# Patient Record
Sex: Female | Born: 1940 | Race: White | Hispanic: No | Smoking: Former smoker
Health system: Southern US, Community
[De-identification: ages and names within clinical notes are randomized; demographics above are authoritative.]

## PROBLEM LIST (undated history)

## (undated) DIAGNOSIS — I10 Essential (primary) hypertension: Secondary | ICD-10-CM

## (undated) DIAGNOSIS — I639 Cerebral infarction, unspecified: Secondary | ICD-10-CM

## (undated) DIAGNOSIS — R7309 Other abnormal glucose: Secondary | ICD-10-CM

## (undated) DIAGNOSIS — E785 Hyperlipidemia, unspecified: Secondary | ICD-10-CM

## (undated) DIAGNOSIS — G459 Transient cerebral ischemic attack, unspecified: Secondary | ICD-10-CM

## (undated) HISTORY — DX: Essential (primary) hypertension: I10

## (undated) HISTORY — DX: Other abnormal glucose: R73.09

## (undated) HISTORY — PX: BREAST SURGERY: SHX581

## (undated) HISTORY — DX: Transient cerebral ischemic attack, unspecified: G45.9

## (undated) HISTORY — DX: Hyperlipidemia, unspecified: E78.5

## (undated) HISTORY — DX: Cerebral infarction, unspecified: I63.9

---

## 2007-08-03 DIAGNOSIS — E7800 Pure hypercholesterolemia, unspecified: Secondary | ICD-10-CM | POA: Insufficient documentation

## 2007-08-03 DIAGNOSIS — R7309 Other abnormal glucose: Secondary | ICD-10-CM

## 2007-08-03 HISTORY — DX: Other abnormal glucose: R73.09

## 2007-08-04 DIAGNOSIS — K219 Gastro-esophageal reflux disease without esophagitis: Secondary | ICD-10-CM | POA: Insufficient documentation

## 2008-01-25 DIAGNOSIS — I10 Essential (primary) hypertension: Secondary | ICD-10-CM

## 2008-01-25 HISTORY — DX: Essential (primary) hypertension: I10

## 2008-06-13 DIAGNOSIS — J309 Allergic rhinitis, unspecified: Secondary | ICD-10-CM | POA: Insufficient documentation

## 2008-09-22 DIAGNOSIS — I493 Ventricular premature depolarization: Secondary | ICD-10-CM | POA: Insufficient documentation

## 2009-04-15 ENCOUNTER — Inpatient Hospital Stay: Payer: Self-pay | Admitting: Internal Medicine

## 2009-04-21 DIAGNOSIS — I059 Rheumatic mitral valve disease, unspecified: Secondary | ICD-10-CM | POA: Insufficient documentation

## 2009-04-21 DIAGNOSIS — I369 Nonrheumatic tricuspid valve disorder, unspecified: Secondary | ICD-10-CM | POA: Insufficient documentation

## 2009-05-04 DIAGNOSIS — I6350 Cerebral infarction due to unspecified occlusion or stenosis of unspecified cerebral artery: Secondary | ICD-10-CM | POA: Insufficient documentation

## 2010-05-18 ENCOUNTER — Ambulatory Visit: Payer: Self-pay | Admitting: Family Medicine

## 2010-06-11 ENCOUNTER — Ambulatory Visit: Payer: Self-pay | Admitting: Sports Medicine

## 2010-07-20 ENCOUNTER — Other Ambulatory Visit: Payer: Self-pay | Admitting: Sports Medicine

## 2010-07-20 DIAGNOSIS — M25552 Pain in left hip: Secondary | ICD-10-CM

## 2010-07-27 ENCOUNTER — Ambulatory Visit
Admission: RE | Admit: 2010-07-27 | Discharge: 2010-07-27 | Disposition: A | Discharge status: Home / Self Care | Payer: Medicare Other | Source: Ambulatory Visit | Attending: Sports Medicine | Admitting: Sports Medicine

## 2010-07-27 DIAGNOSIS — M25552 Pain in left hip: Secondary | ICD-10-CM

## 2010-11-23 ENCOUNTER — Ambulatory Visit: Payer: Self-pay | Admitting: Family Medicine

## 2010-12-07 ENCOUNTER — Other Ambulatory Visit: Payer: Self-pay | Admitting: Obstetrics and Gynecology

## 2010-12-07 DIAGNOSIS — N9489 Other specified conditions associated with female genital organs and menstrual cycle: Secondary | ICD-10-CM

## 2010-12-13 ENCOUNTER — Other Ambulatory Visit: Payer: Medicare Other

## 2011-01-25 HISTORY — PX: HIP SURGERY: SHX245

## 2011-01-25 HISTORY — PX: OOPHORECTOMY: SHX86

## 2011-02-02 ENCOUNTER — Ambulatory Visit: Payer: Self-pay | Admitting: Obstetrics and Gynecology

## 2011-02-02 LAB — BASIC METABOLIC PANEL
BUN: 21 mg/dL — ABNORMAL HIGH (ref 7–18)
Chloride: 104 mmol/L (ref 98–107)
Co2: 29 mmol/L (ref 21–32)
Creatinine: 0.91 mg/dL (ref 0.60–1.30)
EGFR (African American): >60
Potassium: 4.6 mmol/L (ref 3.5–5.1)

## 2011-02-02 LAB — CBC
HCT: 43 % (ref 35.0–47.0)
Platelet: 191 10*3/uL (ref 150–440)
RBC: 4.71 10*6/uL (ref 3.80–5.20)
RDW: 12.4 % (ref 11.5–14.5)
WBC: 10.5 10*3/uL (ref 3.6–11.0)

## 2011-02-07 ENCOUNTER — Ambulatory Visit: Payer: Self-pay | Admitting: Obstetrics and Gynecology

## 2011-02-10 LAB — PATHOLOGY REPORT

## 2011-06-28 ENCOUNTER — Ambulatory Visit: Payer: Self-pay | Admitting: Orthopedic Surgery

## 2011-06-28 LAB — BASIC METABOLIC PANEL
Anion Gap: 8 (ref 7–16)
Calcium, Total: 10.2 mg/dL — ABNORMAL HIGH (ref 8.5–10.1)
EGFR (African American): >60
Potassium: 4.3 mmol/L (ref 3.5–5.1)
Sodium: 137 mmol/L (ref 136–145)

## 2011-06-28 LAB — CBC
MCH: 30 pg (ref 26.0–34.0)
MCV: 91 fL (ref 80–100)
RDW: 12.8 % (ref 11.5–14.5)

## 2011-06-28 LAB — PROTIME-INR: INR: 1

## 2011-06-28 LAB — APTT: Activated PTT: 29.8 secs (ref 23.6–35.9)

## 2011-07-14 ENCOUNTER — Inpatient Hospital Stay: Payer: Self-pay | Admitting: Orthopedic Surgery

## 2011-07-15 LAB — BASIC METABOLIC PANEL
Anion Gap: 10 (ref 7–16)
BUN: 13 mg/dL (ref 7–18)
Calcium, Total: 7.9 mg/dL — ABNORMAL LOW (ref 8.5–10.1)
Chloride: 105 mmol/L (ref 98–107)
EGFR (Non-African Amer.): >60
Glucose: 105 mg/dL — ABNORMAL HIGH (ref 65–99)

## 2011-07-16 LAB — CBC WITH DIFFERENTIAL/PLATELET
Basophil #: 0 10*3/uL (ref 0.0–0.1)
Basophil %: 0.5 %
Eosinophil #: 0.2 10*3/uL (ref 0.0–0.7)
Eosinophil %: 1.6 %
HCT: 28.8 % — ABNORMAL LOW (ref 35.0–47.0)
HGB: 9.8 g/dL — ABNORMAL LOW (ref 12.0–16.0)
MCHC: 34 g/dL (ref 32.0–36.0)
Monocyte #: 1.1 x10 3/mm — ABNORMAL HIGH (ref 0.2–0.9)
Neutrophil %: 76.1 %
Platelet: 117 10*3/uL — ABNORMAL LOW (ref 150–440)
RDW: 12.5 % (ref 11.5–14.5)
WBC: 9.8 10*3/uL (ref 3.6–11.0)

## 2011-07-18 LAB — PATHOLOGY REPORT

## 2012-01-25 HISTORY — PX: HERNIA REPAIR: SHX51

## 2012-05-02 ENCOUNTER — Ambulatory Visit (INDEPENDENT_AMBULATORY_CARE_PROVIDER_SITE_OTHER): Payer: Medicare Other | Admitting: General Surgery

## 2012-05-02 ENCOUNTER — Encounter: Payer: Self-pay | Admitting: General Surgery

## 2012-05-02 VITALS — BP 118/68 | HR 80 | Resp 16 | Ht 64.5 in | Wt 134.0 lb

## 2012-05-02 DIAGNOSIS — K432 Incisional hernia without obstruction or gangrene: Secondary | ICD-10-CM

## 2012-05-02 DIAGNOSIS — Z1211 Encounter for screening for malignant neoplasm of colon: Secondary | ICD-10-CM

## 2012-05-02 MED ORDER — POLYETHYLENE GLYCOL 3350 17 GM/SCOOP PO POWD: ORAL | Status: DC

## 2012-05-02 NOTE — Patient Instructions (Addendum)
Patient advised to have hernia repaired. Patient advised to consider having a colonoscopy done. Patient advised of risks.

## 2012-05-02 NOTE — Progress Notes (Signed)
Patient ID: Gina [name], female   DOB: 04-Feb-1940, 72 y.o.   MRN: [id number]  Chief Complaint  Patient presents with  . Incisional Hernia    left    HPI Gina [name] is a 72 y.o. female who presents with a left abdominal wall hernia. She states she noticed a knot 2 months ago. Seen Dr. Elease Hashimoto who referred her here for evaluation. No complaints of pain or discomfort. She states the knot seems larger at times. Difficulty with bowel movements. No prior colonoscopies. Straining makes a left-sided abdominal wall more uncomfortable. No urinary symptoms appear HPI  Past Medical History  Diagnosis Date  . Hypertension 2010  . Mini stroke 2010, 2014    Past Surgical History  Procedure Laterality Date  . Oophorectomy  2013  . Hip surgery  2013  . Cesarean section    . Breast surgery Left 1982?    biopsy    History reviewed. No pertinent family history.  Social History History  Substance Use Topics  . Smoking status: Former Smoker -- 0.50 packs/day for 5 years  . Smokeless tobacco: Not on file  . Alcohol Use: No    Allergies  Allergen Reactions  . Penicillins Rash    Current Outpatient Prescriptions  Medication Sig Dispense Refill  . amLODipine (NORVASC) 5 MG tablet Take 5 mg by mouth daily.      Marland Kitchen aspirin 81 MG tablet Take 81 mg by mouth daily.      . metoprolol (LOPRESSOR) 50 MG tablet Take 50 mg by mouth 2 (two) times daily.      Marland Kitchen VITAMIN D, ERGOCALCIFEROL, PO Take 1,000 mcg by mouth 2 (two) times daily.      . polyethylene glycol powder (GLYCOLAX/MIRALAX) powder 255 grams one bottle for colonoscopy prep  255 g  0   No current facility-administered medications for this visit.    Review of Systems Review of Systems  Constitutional: Negative.   Respiratory: Negative.   Cardiovascular: Negative.   Gastrointestinal: Negative.     Blood pressure 118/68, pulse 80, resp. rate 16, height 5' 4.5" (1.638 m), weight 134 lb (60.782 kg).  Physical Exam Physical Exam    Constitutional: She appears well-developed and well-nourished.  Neck: Trachea normal. No mass and no thyromegaly present.  Cardiovascular: Normal rate, regular rhythm, normal heart sounds and normal pulses.   No murmur heard. Pulmonary/Chest: Effort normal and breath sounds normal.  Abdominal: Soft. Normal appearance and bowel sounds are normal. There is no hepatosplenomegaly. There is tenderness in the left lower quadrant. A hernia is present. Hernia confirmed positive in the left inguinal area.      Data Reviewed Operative report from the patient's laparoscopic left ovarian cyst excision dated 02/07/2011 reports an 11 mm XL port was utilized and left lower quadrant and the fascial defect closed with an 0 Vicryl suture.  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 A paratubal origin was thought to be the source. No evidence of malignancy.  Assessment    Change in bowel habits, likely secondary to traction on the omentum. Port site hernia in the left lower quadrant.    Plan    The patient was encouraged to have a screening colonoscopy to be sure the secondary source for her change in bowel habits is not present. The pros and cons of the procedure were reviewed. A ventral hernia repair completed laparoscopically should allow rapid return electively. The risks of the procedure as well as the role  of prosthetic mesh has been discussed.    Patient has been scheduled for a colonoscopy on 06-12-12 at [facility]. This patient has been asked to decrease current 325 mg aspirin to 81 mg aspirin starting one week prior to procedure.  Patient's hernia repair has been scheduled for 06-15-12 at [facility].     Earline Mayotte 05/03/2012, 8:22 PM

## 2012-05-03 ENCOUNTER — Encounter: Payer: Self-pay | Admitting: General Surgery

## 2012-05-03 DIAGNOSIS — K432 Incisional hernia without obstruction or gangrene: Secondary | ICD-10-CM | POA: Insufficient documentation

## 2012-05-12 ENCOUNTER — Other Ambulatory Visit: Payer: Self-pay | Admitting: General Surgery

## 2012-05-12 DIAGNOSIS — Z1211 Encounter for screening for malignant neoplasm of colon: Secondary | ICD-10-CM

## 2012-05-12 DIAGNOSIS — K439 Ventral hernia without obstruction or gangrene: Secondary | ICD-10-CM

## 2012-05-12 NOTE — Progress Notes (Signed)
PRE-OPERATIVE PATIENT ORDERS  Patient Name:  Gina [name]  Physician:  Earline Mayotte, MD  Admitting DX:  Ventral hernia   Surgery Date:  06/15/2012  Pre-admit date:  05/30/2012  Allergies:  Penicillins  Patient Status:  SDS (same day surgery)  Laboratory Studies:  None  Blood Products:  None  Radiology Studies:  None  Cardiology Studies:  None  Anesthesia:  general  Permit:  Hernia Surgery:  Repair of Incisional hernia, with or without mesh as indicated.  Prep:  None  Medications: IV:  None IV Meds Added:  Kefzol 2 grams IV on way to OR I am aware of the patient's reported penicillin allergy. Oral Meds:  None  Misc:  None  [facility] [address] [place], [place]  [address]  SIGNATURE:  Signed by    Earline Mayotte, MD Digital Signature on @SVCDATE @ at @CURTIME @ BY: Earline Mayotte, MD

## 2012-05-12 NOTE — Progress Notes (Signed)
PRE-OPERATIVE PATIENT ORDERS  Patient Name:  Gina [name]  Physician:  Earline Mayotte, MD  Admitting DX:   1. Hernia of abdominal wall   2. Encounter for screening colonoscopy     Surgery Date:  06/12/2012  Allergies:  Penicillins  Patient Status:  SDS (same day surgery)  Laboratory Studies:  None  Blood Products:  None  Radiology Studies:  None  Cardiology Studies:  None  Anesthesia:  Propofol  Permit:  Colonoscopy with biopsy or polyp removal as indicated  Prep:  No prep requested  Medications:  IV: 1/2 NS at 100 cc/hr; right side  Misc:  The history and physical will be updated the morning of the procedure.  [facility] [address] [place], [place]  [address]  SIGNATURE:  Signed by    Earline Mayotte, MD Digital Signature on @SVCDATE @ at @CURTIME @ BY: Earline Mayotte, MD

## 2012-05-30 ENCOUNTER — Ambulatory Visit: Payer: Self-pay | Admitting: General Surgery

## 2012-05-30 DIAGNOSIS — I499 Cardiac arrhythmia, unspecified: Secondary | ICD-10-CM

## 2012-05-30 LAB — CBC WITH DIFFERENTIAL/PLATELET
Basophil #: 0.1 10*3/uL (ref 0.0–0.1)
Basophil %: 0.8 %
Eosinophil #: 0.2 10*3/uL (ref 0.0–0.7)
Eosinophil %: 2.6 %
HCT: 38.6 % (ref 35.0–47.0)
Lymphocyte #: 1.7 10*3/uL (ref 1.0–3.6)
Lymphocyte %: 19.8 %
MCH: 29.1 pg (ref 26.0–34.0)
MCHC: 33.4 g/dL (ref 32.0–36.0)
Monocyte #: 0.9 x10 3/mm (ref 0.2–0.9)
Monocyte %: 11 %
Neutrophil #: 5.7 10*3/uL (ref 1.4–6.5)
Platelet: 193 10*3/uL (ref 150–440)
RBC: 4.44 10*6/uL (ref 3.80–5.20)
RDW: 13.2 % (ref 11.5–14.5)
WBC: 8.6 10*3/uL (ref 3.6–11.0)

## 2012-05-30 LAB — BASIC METABOLIC PANEL
Anion Gap: 5 — ABNORMAL LOW (ref 7–16)
Chloride: 105 mmol/L (ref 98–107)
Co2: 29 mmol/L (ref 21–32)
Creatinine: 0.92 mg/dL (ref 0.60–1.30)
EGFR (Non-African Amer.): >60
Glucose: 104 mg/dL — ABNORMAL HIGH (ref 65–99)
Sodium: 139 mmol/L (ref 136–145)

## 2012-05-31 ENCOUNTER — Telehealth: Payer: Self-pay | Admitting: *Deleted

## 2012-05-31 NOTE — Telephone Encounter (Signed)
Patient called today wanting to cancel colonoscopy that was scheduled at [facility] for 06-12-12. This patient states she will be out of town. She does not want to reschedule at this time but does want to proceed with ventral hernia repair that is scheduled for 06-15-12 at [facility].   Trish in endoscopy notified of cancellation.   Also, patient states she is concerned with decreasing 325 mg aspirin to 81 mg aspirin prior to surgery because last time she did that she had a stroke. Patient would like a call back to reassure her that this will be okay and confirm it is necessary decrease aspirin to proceed with surgery.

## 2012-06-02 ENCOUNTER — Telehealth: Payer: Self-pay | Admitting: General Surgery

## 2012-06-02 NOTE — Telephone Encounter (Signed)
Patient has declined to complete colonoscopy prior to ventral hernia repair as it will interfere with a planned trip to [place] to see her 72 year old aunt on her birthday.  She understands that other causes (besides the hernia) may be contributing to her change in bowel habits, but reports the change is modest at she does not want to postpone hernia repair at this time.  She had concerns about decreasing her ASA from 325 to 81 mg.  She had been sent home from [facility] in 2011 after her TIA on 81 mg.  (Based on review of d/c summary). She reports she has been taking 325 mg since the 2011 hospitalization.  She reports that she had TIA symptoms earlier this year that resolved spontaneously for which she did not seek medical assessment.   She decreased her ASA from 325 to 81 mg in 2013 for her hip replacement without ill effect.  At present, she was advised that she can continue on ASA 325 realizing there is a slightly increased risk of bleeding. She may elect to decrease this to 81 mg daily for one week prior to the procedure.  Will plan to proceed with repair of her suspected port site hernia in the LLQ on Jun 15, 2012 as planned.     Cc: Lorie Phenix, MD

## 2012-06-13 ENCOUNTER — Telehealth: Payer: Self-pay | Admitting: *Deleted

## 2012-06-13 NOTE — Telephone Encounter (Signed)
Patient was contacted at the request of Dr. Lemar Livings (due to injury). This patient scheduled for surgery on 06-15-12; however, Dr. Lemar Livings will not be able to complete that day. Patient was given the option to reschedule to mid June with Dr. Lemar Livings or have partner complete surgery. Patient wishes to reschedule surgery to 07-12-12.  Patient was reminded to decrease 325 mg aspirin to 81 mg aspirin one week prior to procedure. She verbalizes understanding.   Message has been left on the O.R. posting line regarding date change.

## 2012-07-02 ENCOUNTER — Telehealth: Payer: Self-pay | Admitting: *Deleted

## 2012-07-02 NOTE — Telephone Encounter (Signed)
Patient called wanting to reschedule surgery that was scheduled for 07-12-12 due to something that has come up in her schedule (it will take her about a week and a half to take care of). This has been rescheduled for 07-26-12 at [facility].  Message has been left on the O.R. Posting line regarding date change.

## 2012-07-26 ENCOUNTER — Ambulatory Visit: Payer: Self-pay | Admitting: General Surgery

## 2012-07-26 DIAGNOSIS — K439 Ventral hernia without obstruction or gangrene: Secondary | ICD-10-CM

## 2012-07-30 ENCOUNTER — Encounter: Payer: Self-pay | Admitting: General Surgery

## 2012-08-02 ENCOUNTER — Ambulatory Visit (INDEPENDENT_AMBULATORY_CARE_PROVIDER_SITE_OTHER): Payer: Medicare Other | Admitting: General Surgery

## 2012-08-02 ENCOUNTER — Encounter: Payer: Self-pay | Admitting: General Surgery

## 2012-08-02 VITALS — BP 120/80 | HR 80 | Resp 16 | Ht 64.5 in | Wt 132.0 lb

## 2012-08-02 DIAGNOSIS — K439 Ventral hernia without obstruction or gangrene: Secondary | ICD-10-CM

## 2012-08-02 NOTE — Progress Notes (Signed)
Patient ID: Gina [name], female   DOB: [birth year], 72 y.o.   MRN: [id number]  Chief Complaint  Patient presents with  . Routine Post Op    lap hernia repair    HPI Gina [name] is a 72 y.o. female who presents for a post op left lateral abdominal wall port site hernia repair. The procedure was performed on 07/26/12. The patient states she has some soreness but overall is doing well. No problems with her bowels and eating okay.    HPI  Past Medical History  Diagnosis Date  . Hypertension 2010  . Mini stroke 2010, 2014    Past Surgical History  Procedure Laterality Date  . Oophorectomy  2013  . Hip surgery  2013  . Cesarean section    . Breast surgery Left 1982?    biopsy  . Hernia repair Left 2014    Laparoscopic placement  10 x 15 cm Physiomesh at lateral abdominal wall port site hernia    History reviewed. No pertinent family history.  Social History History  Substance Use Topics  . Smoking status: Former Smoker -- 0.50 packs/day for 5 years  . Smokeless tobacco: Not on file  . Alcohol Use: No    Allergies  Allergen Reactions  . Penicillins Rash    Current Outpatient Prescriptions  Medication Sig Dispense Refill  . acetaminophen (TYLENOL) 325 MG tablet Take by mouth every 6 (six) hours as needed for pain.      Marland Kitchen amLODipine (NORVASC) 5 MG tablet Take 5 mg by mouth daily.      Marland Kitchen aspirin 81 MG tablet Take 81 mg by mouth daily.      . metoprolol (LOPRESSOR) 50 MG tablet Take 50 mg by mouth 2 (two) times daily.      Marland Kitchen VITAMIN D, ERGOCALCIFEROL, PO Take 1,000 mcg by mouth 2 (two) times daily.       No current facility-administered medications for this visit.    Review of Systems Review of Systems  Constitutional: Negative.   Respiratory: Negative.   Cardiovascular: Negative.   Gastrointestinal: Negative.     Blood pressure 120/80, pulse 80, resp. rate 16, height 5' 4.5" (1.638 m), weight 132 lb (59.875 kg).  Physical Exam Physical Exam  Constitutional: She is  oriented to person, place, and time. She appears well-developed and well-nourished.  Abdominal: Soft. Normal appearance.  All port sites healing well.   Neurological: She is alert and oriented to person, place, and time.  Skin: Skin is warm and dry.    Data Reviewed No data to review.  Assessment    Doing well status post laparoscopic repair of port site hernia the left lower abdominal wall.     Plan    The patient was encouraged to increase her activity as tolerated. Care was lifting was demonstrated.       Earline Mayotte 08/03/2012, 6:38 PM

## 2012-08-02 NOTE — Patient Instructions (Addendum)
Patient advised she can drive once she is pain free. Also instructed to continue the use of a heat pack.

## 2012-08-03 ENCOUNTER — Encounter: Payer: Self-pay | Admitting: General Surgery

## 2012-08-14 ENCOUNTER — Encounter: Payer: Self-pay | Admitting: General Surgery

## 2012-09-22 IMAGING — CR DG HIP COMPLETE 2+V*L*
1 series · 2 of 2 positions shown · non-contrast
Comparison: none

REASON FOR EXAM: pain
COMMENTS:

PROCEDURE:     KDR - KDXR HIP LEFT COMPLETE  - May 18, 2010  [DATE]
RESULT:     AP and lateral views of the left hip reveal the bones to be
mildly osteopenic. I see no acute fracture or dislocation. No lytic or
blastic lesion is identified. No significant degenerative change is seen.

[Series 1: view not recorded · 0.17mm/px · 2 of 2 slices shown]
[im 1/2]
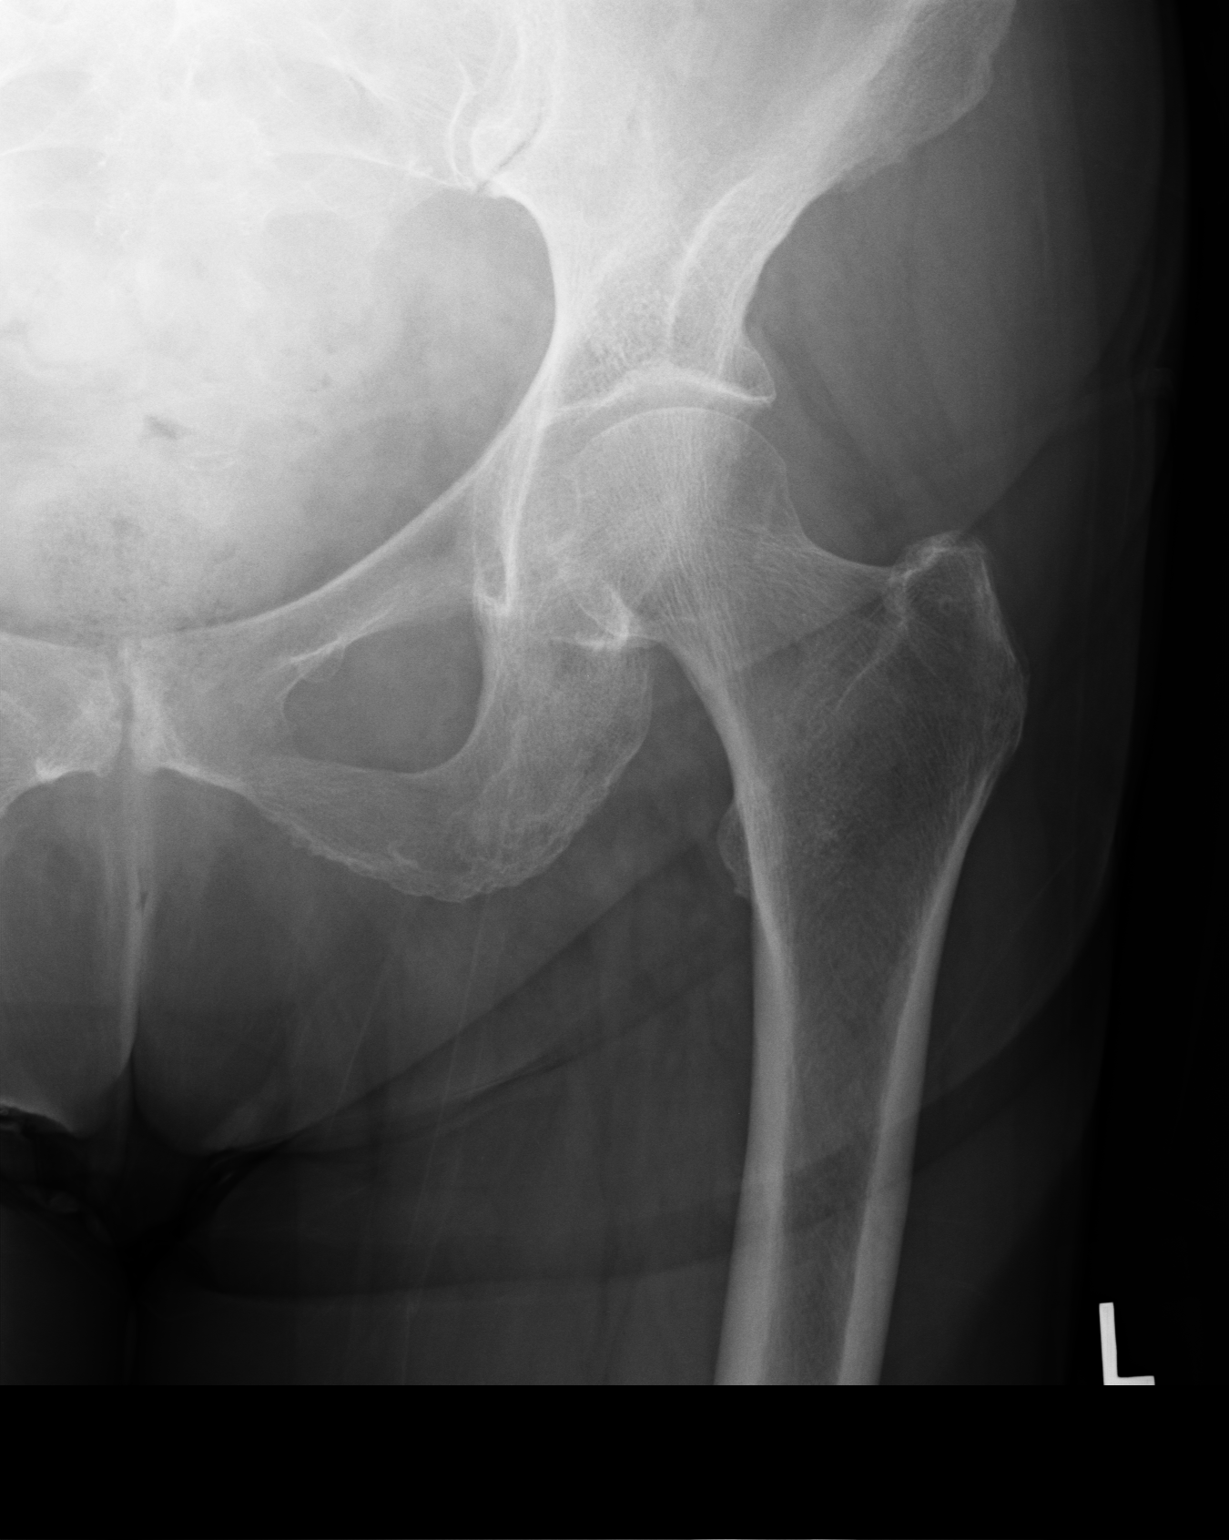
[im 2/2]
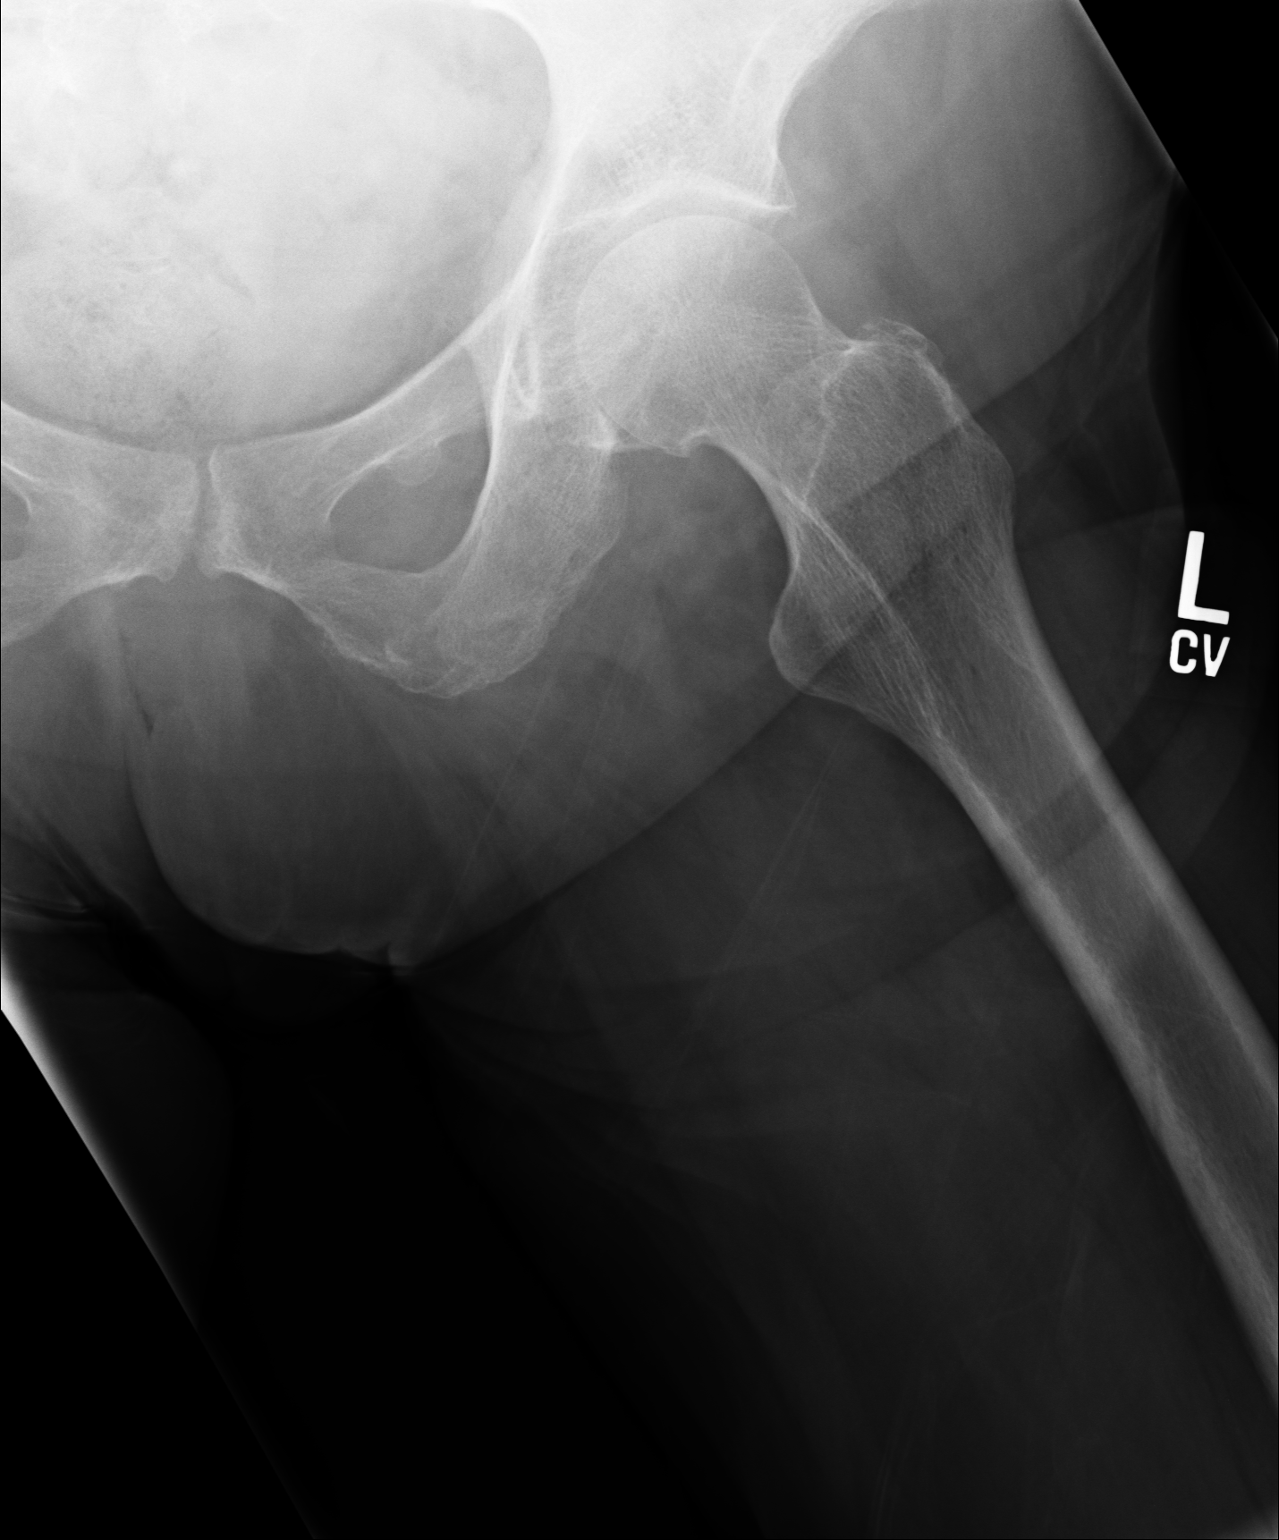

[2 of 2 positions shown; findings below may reference images not displayed]

IMPRESSION: I do not see acute bony abnormality of the left hip nor
evidence of significant degenerative change. Given the patient's chronic
symptoms, it may be useful to consider her for MRI.

## 2012-10-19 LAB — HEMOGLOBIN A1C: Hgb A1c MFr Bld: 5.9 % (ref 4.0–6.0)

## 2013-06-24 DIAGNOSIS — R002 Palpitations: Secondary | ICD-10-CM | POA: Insufficient documentation

## 2013-11-25 ENCOUNTER — Encounter: Payer: Self-pay | Admitting: General Surgery

## 2014-01-28 DIAGNOSIS — R002 Palpitations: Secondary | ICD-10-CM | POA: Diagnosis not present

## 2014-01-28 DIAGNOSIS — I10 Essential (primary) hypertension: Secondary | ICD-10-CM | POA: Diagnosis not present

## 2014-05-16 NOTE — Op Note (Signed)
PATIENT NAME:  Gina [name], Trena E MR#:  [id number] DATE OF BIRTH:  August 31, 1940  DATE OF PROCEDURE:  07/26/2012  PREOPERATIVE DIAGNOSIS: Left lateral abdominal wall port site hernia.   POSTOPERATIVE DIAGNOSIS: Left lateral abdominal wall port site hernia.  OPERATIVE PROCEDURE: Repair of lateral abdominal wall hernia with a 10 x 15 Physiomesh placed laparoscopically.   SURGEON: Donnalee CurryJeffrey Sybrina Laning, MD   ANESTHESIA: General endotracheal, under Dr. Maisie Fushomas.   ESTIMATED BLOOD LOSS: Less than 5 mL.   CLINICAL NOTE: This 74 year old woman had undergone an ovarian cystectomy with an 11 mm port placed in the left lower quadrant. This had been closed with a Vicryl suture. The patient has developed an enlarging hernia in this area and was felt to be a candidate for elective repair. She received Kefzol prior to the procedure.   OPERATIVE NOTE: With the patient under adequate general endotracheal anesthesia, the abdomen was prepped with ChloraPrep and draped. A 5 mm step port was placed through a transumbilical incision. After an adequate hanging drop test, the abdomen was insufflated 10 mmHg pressure. Inspection showed a significant volume of omentum herniated through the old port site in the left lower quadrant. A 5 and 11 mm port were then placed on the right side of the abdomen under direct vision. The omental tissue was retracted and hemostasis achieved by electrocautery. After exposing the defect, it was elected to place a 10 x 15 cm Physiomesh. This is brought into the field, orientated with the cross centered over the defect and then approximately 30 secure strap tacks used to anchor it in place in multiple concentric rings. Bleeding from likely the inferior epigastric vessel within the rectus sheath was encountered, and this was controlled with a 0 Vicryl figure-of-eight suture placed transvaginally. With good hemostasis achieved, the abdomen was then desufflated and the 11 mm port site in the right lower quadrant  was closed with a transvaginal 0 Maxon suture. Skin incisions were closed with 4-0 Vicryl subcuticular suture. Benzoin, Steri-Strips, Telfa, and Tegaderm dressings were applied.   The patient tolerated the procedure well and was taken to the recovery room in stable condition.   ____________________________ Earline MayotteJeffrey W. Shasta Chinn, MD jwb:cb D: 07/26/2012 17:27:57 ET T: 07/26/2012 23:49:11 ET JOB#: [id number]  cc: Earline MayotteJeffrey W. Justyn Boyson, MD, <Dictator> Myrle ShengKristi M. Katrinka BlazingSmith, MD Casimira Sutphin Brion AlimentW Yehonatan Grandison MD ELECTRONICALLY SIGNED 07/31/2012 21:03

## 2014-05-18 NOTE — Op Note (Signed)
PATIENT NAME:  Gina [name], Gina [name] MR#:  [id number] DATE OF BIRTH:  [birth year]  DATE OF PROCEDURE:  07/14/2011  PREOPERATIVE DIAGNOSIS: Left hip avascular necrosis.   POSTOPERATIVE DIAGNOSIS: Left hip avascular necrosis.   PROCEDURE: Left hip total hip replacement.   ANESTHESIA: Spinal.   SURGEON: Leitha SchullerMichael J. Verne Cove,  MD   ASSISTANT: Thompson GrayerJonathan Prentice, PA-C   DESCRIPTION OF PROCEDURE: The patient was brought to the Operating Room, and after adequate anesthesia was obtained the patient was placed on the operative table with the Medacta attachment for anterior hip replacement. After prepping and draping in the usual sterile fashion and obtaining preoperative x-rays for intraoperative measurements, appropriate patient identification and timeout procedures were carried out. An incision was made just lateral to the medial border of the tensor fascia lata muscle centered over the greater trochanter. Next, the tensor fascia was incised and the muscle retracted laterally. The deep fascia was then incised, and the fascia over the rectus was identified and incised with the rectus being retracted medially. At this point, the anterior capsule and the anterior fat pad were exposed. The fat pad over the anterior capsule was ablated with the use of argon beam cautery. Next, the C-arm was brought in to check localization and an anterior arthrotomy carried out, developing flaps for subsequent placement of a modified Charnley retractor. A femoral neck cut was carried out at this point, again checking position with the C-arm and the head removed. There was an approximately 3.5 cm diameter area of exposed bone on the femoral head. A portion of the labrum was excised to get adequate visualization of the cup along with excision of the ligamentum teres. Sequential reaming was carried out with 48 mm giving good bleeding bone. Next, the 48 mm trial was placed, and with pullout test it was very secure. The final component was then  impacted into position and deemed to be appropriate position. Next, going to the femur, the posterior capsule was partially incised to release this and allow for lateral mobilizing of the proximal femur to get adequate exposure. A box osteotome was used proximally, followed by sequential broaching with a #3 broach in place. Trials were made and appropriate length chosen with equal leg lengths and good fill of the proximal femur. At this point, the final stem was inserted and final head and neck components assembled and placed. The hip was then reduced and was stable through a range of motion. The wound was thoroughly irrigated. The arthrotomy was repaired using #1 Vicryl, a running quill suture for the tensor fascia, a subcutaneous drain placed, followed by a subcutaneous 2-0 quill suture, followed by skin staples, Xeroform, 4 x 4's, ABD, and tape.   ESTIMATED BLOOD LOSS: 550.   COMPLICATIONS: None.   SPECIMENS: Femoral head.   IMPLANTS: Medacta Versafit cup, DM size 48 acetabular liner with a #3 standard AMIS stem, a size S 28 mm femoral head with a dual mobility liner for the 48 mm cup.   COMPLICATIONS:  None.   CONDITION TO THE RECOVERY ROOM: Stable.   ____________________________ Leitha SchullerMichael J. Sheyann Sulton, MD mjm:cbb D: 07/14/2011 21:28:08 ET T: 07/15/2011 10:44:26 ET JOB#: [id number]  cc: Leitha SchullerMichael J. Zahra Peffley, MD, <Dictator> Leitha SchullerMICHAEL J Zekiah Coen MD ELECTRONICALLY SIGNED 07/15/2011 11:55

## 2014-05-18 NOTE — Discharge Summary (Signed)
PATIENT NAME:  Gina [name], Gina [name] MR#:  [id number] DATE OF BIRTH:  1940/02/21  DATE OF ADMISSION:  07/14/2011 DATE OF DISCHARGE:  07/17/2011  ADMITTING DIAGNOSIS: Status post left total hip replacement for avascular necrosis.   DISCHARGE DIAGNOSIS: Status post left total hip replacement for avascular necrosis.   ATTENDING: Kennedy Bucker, MD [facility])  PROCEDURES: On 07/14/2011 the patient underwent left total hip arthroplasty by Dr. Rosita Kea with assistant Thompson Grayer, PA-C.   ANESTHESIA: Spinal.   ESTIMATED BLOOD LOSS: 550 mL.  SPECIMENS SENT: Femoral head.   OPERATIVE FINDINGS: Severe degenerative change of the femoral head. Hemovac drain was placed.   IMPLANTS: Medacta, Amis system.   COMPLICATIONS: No complications occurred.  PATHOLOGY: The pathology report was consistent with avascular necrosis as well as moderate osteoarthrosis.  PATIENT HISTORY: The patient is 74 year old who has previously been seen by several providers in our office. She has had a several year history of left hip, both lateral and in the groin, pain. She is using a cane for ambulation. Her pain has increased. She has had nonoperative treatment with NSAIDs and narcotics without relief. MRI of her left hip from 2012 from [facility] had revealed diffuse bone marrow edema in her femoral head consistent with avascular necrosis. She had also had left hip effusion.   ALLERGIES AND ADVERSE REACTIONS: Penicillin and HCTZ.   PAST MEDICAL HISTORY:  1. Transient ischemic attack in 2011.  2. Hypertension. 3. Shingles.  4. Chickenpox.   PHYSICAL EXAMINATION: HEART: Largely irregular rate and rhythm, although she has an occasional skipped or altered beat and this is normal for her she indicates. LUNGS: Clear to auscultation. LEFT HIP: Previous examination has revealed 5 degrees of internal rotation and 20 degrees of external rotation and pain with rotational motion. No flexion contracture. Distally  she has a palpable distal pulse in her leg and is able to actively plantar flex and dorsiflex at the ankle well. Her calf is not significantly tender.   HOSPITAL COURSE: The patient underwent the aforementioned procedure by Dr. Rosita Kea without complication on 07/14/2011 and was transferred to the postanesthesia care unit and then the orthopedic floor in stable condition. First day postoperative she was already being treated with deep vein thrombosis prophylaxis with aspirin, TED hose, AV-I boots, and Xarelto. She was tolerating her diet. Her dressing was clean and intact. She was having some moderate pain, unfortunately. On the second day postoperative, she was tolerating physical therapy but her progression had been slow. Her Hemovac was removed and her dressing was changed. There was no sign of infection about her surgical site. She was afebrile with vital signs that were stable. She would improve during her stay here. She would ambulate with therapy up to 170 feet and work on therapeutic exercises. She would continue to have a good deal of pain, unfortunately. Of note her hemoglobin did drop to 9.8, on 08/15/2011. However, no transfusion had been deemed necessary while she was here. The patient did have her Foley catheter removed in good time. She past stool on multiple occasions while here.   CONDITION AT DISCHARGE: Stable.   DISPOSITION: Home with home health physical therapy.   DISCHARGE MEDICATIONS:  1. Oxycodone 5 to 10 mg every four hours as needed for pain.  2. She may also take her tramadol as well as some Tylenol as needed for pain. 3. Flexeril 5 mg as needed for muscle spasm and cramping.  4. Xarelto 10 mg a day. She will not resume aspirin  until finishing this.   DISCHARGE INSTRUCTIONS AND FOLLOWUP:  1. She is weight-bearing as tolerated on the affected leg. She will wear knee-high TED hose during the day.  2. Regular diet.  3. She can resume her home medicines with multivitamin.  4. She  may ice her hip, but she will leave her dressing on, keep it clean and dry.  5. She is to call our office for any disturbing symptoms. She will also call our office to confirm a two week postoperative follow-up appointment. ____________________________ Letta MoynahanJonathan R. Ayansh Feutz, [place]PA jrp:slb D: 08/01/2011 16:58:48 ET T: 08/01/2011 17:23:46 ET JOB#: [id number]  cc: Letta MoynahanJonathan R. Clyde CanterburyPrentice, [place]PA, <Dictator> Letta MoynahanJONATHAN R Ausar Georgiou PA ELECTRONICALLY SIGNED 08/04/2011 8:09

## 2014-05-18 NOTE — Op Note (Signed)
PATIENT NAME:  Gina [name], BIER MR#:  [id number] DATE OF BIRTH:  1940-03-20  DATE OF PROCEDURE:  02/07/2011  PREOPERATIVE DIAGNOSES:  1. Left adnexal mass.  2. Abnormal endometrial fluid collection on ultrasound.   POSTOPERATIVE DIAGNOSES:  1. 6 cm left ovarian cyst.  2. Abnormal endometrial fluid collection.  3. Dense pelvic adhesive disease.   OPERATIVE PROCEDURES:  1. Dilation and curettage of endometrium.  2. Operative laparoscopy with adhesiolysis and left salpingo-oophorectomy.   SURGEON: Prentice Docker. Travas Schexnayder, MD   FIRST ASSISTANT: None.   ANESTHESIA: General endotracheal.   INDICATION: Gina [name] is a 74 year old white female who presents for surgical management of significant symptomatic left adnexal cystic mass. During work-up for abdominal pain, this mass was noted; preoperative CA-125 was normal. The patient also had findings of an abnormal endometrial fluid collection on ultrasound and dilation and curettage was recommended.   FINDINGS AT SURGERY: Dense pelvic adhesive disease with bowel/uterine/adnexal adhesions bilaterally. There was a 6 cm smooth cystic mass within the cul-de-sac that was mobilized. The cyst fluid was noted to be clear on aspiration. The right ovary was grossly normal once it was identified following adhesiolysis. There were dense thick adhesions between the bowel and right uterine cornual area which were left intact because of the degree of fibrosis noted. The left ureter was noted to have normal peristalsis following the LSO.   DESCRIPTION OF PROCEDURE: The patient was brought to the operating room where she was placed in the supine position and general endotracheal anesthesia was induced without difficulty. She was placed in the low lithotomy position and a ChloraPrep and Betadine abdominal, perineal, intravaginal prep and drape was performed in a standard fashion. The vaginal introitus was stenotic and, therefore, a pediatric speculum had to be used to access  the cervix. Cervix was grasped with single-tooth tenaculum and dilated using Hanks dilators up to a #18 caliber. Because of the limited access to the vagina, decision was to forgo hysteroscopy and proceed with curettage. Uterus did sound to 10 cm and scant tissue was obtained on curettage with a serrated curette. Following completion of this part of the procedure, a Hulka tenaculum was placed into the cervix. All instrumentation was removed from the vagina. The abdominal laparoscopy was then performed in the following manner.   Subumbilical incision 1 cm in length was infiltrated using Sensorcaine with 1:200,000 epinephrine. Scalpel incision 5 mm in length was made and this was followed by direct entry into the abdominal pelvic cavity using the Optiview laparoscopic 5 mm trocar system. No bowel or vascular injury was encountered. A 5 mm port was then placed in the right lower quadrant and an 11 mm port was placed in the left lower quadrant under direct visualization. Anterior abdominal wall bowel adhesions were then lysed using the Ace Harmonic scalpel. These were taken down and the extensive bowel adnexal adhesions were encountered. Both sharp and blunt dissection was used to mobilize these adhesions. The Ace Harmonic scalpel was the primary instrument in the left adnexal region. Once adequate takedown of adhesions was accomplished following 20 minutes of dissection, the left ovary with the cystic mass component was mobilized out of the cul-de-sac. The Ace Harmonic scalpel was used to clamp, desiccate, and cut the infundibulopelvic ligament as well as the uteroovarian ligament. Once mobilized, it was placed in an EndoCatch bag and was brought to the surface of the 11 mm port site. Here the cystic fluid was aspirated using a needle aspirator and the fluid was noted to  be clear. This decompressed the cystic mass and it was removed from the left 11 mm port site. The trocar was placed back into the abdominal cavity.  The right tube and ovary were then dissected free of adhesions although very thick bowel right uterine cornu adhesions were still persisting and decision was made to stop at this point being that the right ovary appeared grossly normal although it was densely stuck to the right pelvic sidewall. Because of the close proximity of the ureter, decision was made to let the ovary remain in-situ as it had normal appearance on laparoscopy. Pelvis was copiously irrigated. The irrigant fluid was aspirated. The procedure was then terminated with all instruments being removed from the abdomen. The 11 mm port site was closed with 0 Vicryl on the fascia and 4-0 plain on the skin. Dermabond was placed over the three incisions. The patient was then awakened, extubated, and taken to the recovery room in satisfactory condition.   Estimated blood loss was minimal. Complications were none. All instruments, needle, and sponge counts were verified as correct.    The patient was given 600 mg of Cleocin antibiotic intraoperatively because of the extensive nature of the adhesions and dissection.   ____________________________ Prentice DockerMartin A. Dreyton Roessner, MD mad:drc D: 02/07/2011 16:15:09 ET T: 02/08/2011 10:19:37 ET JOB#: [id number]  cc: Daphine DeutscherMartin A. Hamed Debella, MD, <Dictator> Prentice DockerMARTIN A Alajia Schmelzer MD ELECTRONICALLY SIGNED 02/21/2011 12:43

## 2014-05-18 NOTE — H&P (Signed)
PATIENT NAME:  Gina [name], Gina [name] MR#:  [id number] DATE OF BIRTH:  [birth year]  DATE OF ADMISSION:  02/07/2011  PREOPERATIVE DIAGNOSES:  1. Left adnexal mass.  2. Abnormal endometrial fluid collection.  HISTORY OF PRESENT ILLNESS: Gina [name] is a 74 year old widowed white female, para 1-0-1-1, menopausal, on no hormone replacement therapy, who presents for surgical management of left adnexal cyst and an atypical endometrial fluid collection that was found on ultrasound and MRI. The patient had been undergoing work-up of left hip pain when MRI on 08/06/2010 revealed an incidental septated cystic lesion in the left posterior pelvis measuring 6.3 x 4.4 x 4.7 cm. Subsequent ultrasound did reveal an atypical endometrial fluid collection within the uterus measuring 1.95 x 0.72 cm. Outpatient endometrial biopsy could not be accomplished. The left ovary did demonstrate a 6.23 cm adnexal mass that appeared to be adjacent to or arising from the left ovary. It was anechoic. There was no vascular flow noted on Doppler. In light of these findings, the patient was counseled to undergo hysteroscopy with dilation and curettage as well as laparoscopic bilateral salpingo-oophorectomy. The patient denies history of vaginal bleeding. She denies pelvic pain. Bowel function is reportedly normal. Bladder function is reportedly normal. There has been no fever, chills, sweats, nausea, vomiting, change in weight, or change in clothing size.   PAST MEDICAL HISTORY:  1. Chronic hypertension.  2. 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  3. Gastroesophageal reflux disease.  4. Seasonal allergies. 5. History of stroke in 2010. 6. Hyperlipidemia.  7. Herpes zoster.  8. Multiple urinary tract infections.  PAST SURGICAL HISTORY:  1. Cesarean section x1.  2. Dilation and curettage.  3. Breast biopsy.   PAST OB HISTORY: Para 1-0-1-1. Cesarean section for failure to progress with infant being 8 pounds, 8 ounces.  SAB x1 that  did require dilation and curettage.   PAST GYN HISTORY: Menarche age 7. Menopause age 23. The patient has no history of being on hormone replacement therapy. Has no history of abnormal Pap smears.   FAMILY HISTORY: Negative for cancer of the colon, ovary, or breast.   SOCIAL HISTORY: The patient does not smoke, does not drink, and does not use drugs. The patient is a retired Film/video editor. She is widowed x12 years.   REVIEW OF SYSTEMS: The patient denies recent illness. She denies history of reactive airway disease. She denies history of coagulopathy.   PHYSICAL EXAMINATION:   VITAL SIGNS: Height 5 feet 4 inches, weight 135, blood pressure 156/72, heart rate 71, and BMI 23.  GENERAL:  A pleasant white female who is alert and oriented. Affect is appropriate.   HEENT: Oropharynx is clear.   NECK: Supple. There is no thyromegaly or adenopathy.   LUNGS: Clear.   HEART: Regular rate and rhythm without murmur, S3 or S4.   ABDOMEN: Soft, slightly protuberant. No hernias. Midline scar in the abdomen is well healed and without evidence of hernia. There is no hepatosplenomegaly. There is mild tenderness in the left lower quadrant, , without peritoneal signs.   PELVIC: External genitalia is notable for atrophy. BUS is normal. Vagina has stenosis of  the introitus. There is markedly decreased estrogen affect. The cervix is poorly visualized with pediatric and Peterson speculums. Endometrial biopsy could not be attempted because of atrophy and will be deferred to the OR. Rectovaginal examination reveals a midplane uterus that is mobile, nontender, and of normal size and shape. There was a palpable left adnexal mass that is mobile and  approximately 5 cm in diameter. It is 2 out of 4 tender. The right adnexa is nonpalpable and nontender.   EXTREMITIES: No clubbing, cyanosis, or edema.   SKIN: No rash.   IMPRESSION:  1. 6 cm left adnexal mass, minimally symptomatic, found on bimanual examination.  MRI is suggestive of a simple or septated cyst.  2. Atypical fluid collection within the endometrial cavity of uncertain significance without history of postmenopausal bleeding or discharge.   PLAN:  1. Hysteroscopy with dilation and curettage.  2. Laparoscopy with probable bilateral salpingo-oophorectomy. Date of surgery is 02/07/2011.   CONSENT NOTE: Gina [name] is to undergo laparoscopy with bilateral salpingo-oophorectomy and hysteroscopy with dilation and curettage on 02/07/2011. She is understanding of the planned procedure and is aware of and accepting of all surgical risks which include but are not limited to bleeding, infection, pelvic organ injury with need for repair, blood clot disorders, anesthesia risks, and death. All questions are answered and informed consent is given. The patient is ready and willing to proceed with surgery as scheduled. ____________________________ Prentice DockerMartin A. Ladasia Sircy, MD mad:slb D: 02/02/2011 17:07:21 ET T: 02/02/2011 17:27:48 ET JOB#: [id number]  cc: Daphine DeutscherMartin A. Thaddius Manes, MD, <Dictator> Prentice DockerMARTIN A Danniela Mcbrearty MD ELECTRONICALLY SIGNED 02/21/2011 12:42

## 2014-07-18 ENCOUNTER — Ambulatory Visit (INDEPENDENT_AMBULATORY_CARE_PROVIDER_SITE_OTHER): Payer: Medicare Other | Admitting: Family Medicine

## 2014-07-18 ENCOUNTER — Encounter: Payer: Self-pay | Admitting: Family Medicine

## 2014-07-18 VITALS — BP 162/90 | HR 80 | Temp 98.1°F | Resp 16 | Ht 64.0 in | Wt 140.0 lb

## 2014-07-18 DIAGNOSIS — G5790 Unspecified mononeuropathy of unspecified lower limb: Secondary | ICD-10-CM | POA: Insufficient documentation

## 2014-07-18 DIAGNOSIS — H6121 Impacted cerumen, right ear: Secondary | ICD-10-CM | POA: Insufficient documentation

## 2014-07-18 DIAGNOSIS — R270 Ataxia, unspecified: Secondary | ICD-10-CM | POA: Insufficient documentation

## 2014-07-18 DIAGNOSIS — N9489 Other specified conditions associated with female genital organs and menstrual cycle: Secondary | ICD-10-CM | POA: Insufficient documentation

## 2014-07-18 DIAGNOSIS — E559 Vitamin D deficiency, unspecified: Secondary | ICD-10-CM | POA: Insufficient documentation

## 2014-07-18 DIAGNOSIS — R748 Abnormal levels of other serum enzymes: Secondary | ICD-10-CM | POA: Insufficient documentation

## 2014-07-18 DIAGNOSIS — Z8673 Personal history of transient ischemic attack (TIA), and cerebral infarction without residual deficits: Secondary | ICD-10-CM | POA: Insufficient documentation

## 2014-07-18 NOTE — Progress Notes (Signed)
Subjective:     Patient ID: Gina [name], female   DOB: 1940/11/22, 74 y.o.   MRN: [id number]  HPI  Chief Complaint  Patient presents with  . Ear Fullness    right more so than left  States it feels  like prior wax impactions. Reports allergies are under control.   Review of Systems  Cardiovascular:       States she has an element of white coat hypertension       Objective:   Physical Exam  Constitutional: She appears well-developed and well-nourished. No distress.  HENT:  Left Ear: Tympanic membrane normal.  Right ear with cerumen impaction. After irrigation per Irving Burton canal is patent, TM is intact without inflammation and patient reports improvement in her sx.       Assessment:    1. Impacted cerumen of right ear - Ear Lavage    Plan:    Minimize use of Q-tips.

## 2014-07-18 NOTE — Patient Instructions (Signed)
Avoid Qtips.

## 2014-09-01 ENCOUNTER — Ambulatory Visit (INDEPENDENT_AMBULATORY_CARE_PROVIDER_SITE_OTHER): Payer: Medicare Other | Admitting: Family Medicine

## 2014-09-01 ENCOUNTER — Encounter: Payer: Self-pay | Admitting: Family Medicine

## 2014-09-01 VITALS — BP 146/72 | HR 64 | Temp 97.8°F | Resp 16 | Wt 140.0 lb

## 2014-09-01 DIAGNOSIS — I6350 Cerebral infarction due to unspecified occlusion or stenosis of unspecified cerebral artery: Secondary | ICD-10-CM

## 2014-09-01 DIAGNOSIS — I10 Essential (primary) hypertension: Secondary | ICD-10-CM | POA: Diagnosis not present

## 2014-09-01 DIAGNOSIS — I639 Cerebral infarction, unspecified: Secondary | ICD-10-CM | POA: Diagnosis not present

## 2014-09-01 DIAGNOSIS — M1990 Unspecified osteoarthritis, unspecified site: Secondary | ICD-10-CM | POA: Diagnosis not present

## 2014-09-01 DIAGNOSIS — M25552 Pain in left hip: Secondary | ICD-10-CM | POA: Diagnosis not present

## 2014-09-01 MED ORDER — PREDNISONE 10 MG PO TABS: ORAL_TABLET | ORAL | Status: DC

## 2014-09-01 NOTE — Progress Notes (Signed)
Patient ID: Gina [name], female   DOB: 05-14-1940, 74 y.o.   MRN: [id number]         Patient: Gina [name] Female    DOB: 06-11-1940   74 y.o.   MRN: [id number] Visit Date: 09/01/2014  Today's Provider: Margarita Rana, MD   Chief Complaint  Patient presents with  . Hypertension  . Hip Pain   Subjective:    Hypertension This is a chronic problem. The problem is unchanged. The problem is controlled (At home her blood pressure runs around 140's she does not remember the lower number. ). Associated symptoms include malaise/fatigue. Pertinent negatives include no blurred vision, chest pain, headaches, neck pain, palpitations, peripheral edema, PND or shortness of breath. Compliance problems include medication side effects (Pt is wondering if the increased Metoprolol is causing her fatigue).   Hip Pain  There was no injury mechanism. The pain is present in the left hip. The quality of the pain is described as aching. The pain is at a severity of 6/10. The pain has been worsening (Especially when the weather changes.) since onset. Pertinent negatives include no numbness. She reports no foreign bodies present. The symptoms are aggravated by weight bearing. Treatments tried: Had surgery about 3-4 years ago.     Did see her orthopedic doctor and he said her hip is ok, but thinks it is her back. Gave her some back exercises.  Did not help.   Is taking Metoprolol twice a day.      Allergies  Allergen Reactions  . Chlorthalidone Nausea Only  . Hydrochlorothiazide   . Lisinopril Cough  . Penicillins Rash   Previous Medications   ACETAMINOPHEN (TYLENOL) 325 MG TABLET    Take by mouth every 6 (six) hours as needed for pain.   AMLODIPINE (NORVASC) 5 MG TABLET    Take 5 mg by mouth daily.   ASPIRIN 325 MG TABLET    Take 325 mg by mouth daily.   CETIRIZINE (ZYRTEC) 10 MG TABLET    Take 10 mg by mouth daily.   METOPROLOL (LOPRESSOR) 100 MG TABLET    Take 100 mg by mouth 2 (two) times daily.   RANITIDINE  (ZANTAC) 150 MG TABLET    Take 150 mg by mouth daily.   VITAMIN D, ERGOCALCIFEROL, PO    Take 1,000 mcg by mouth 2 (two) times daily.    Review of Systems  Constitutional: Positive for malaise/fatigue. Negative for fever, chills, diaphoresis, activity change, appetite change, fatigue and unexpected weight change.  Eyes: Negative for blurred vision.  Respiratory: Negative for apnea, cough, choking, chest tightness, shortness of breath, wheezing and stridor.   Cardiovascular: Positive for leg swelling (Left ankle swells.). Negative for chest pain, palpitations and PND.  Gastrointestinal: Negative for nausea, vomiting, abdominal pain, diarrhea, constipation, blood in stool, abdominal distention, anal bleeding and rectal pain.  Musculoskeletal: Positive for myalgias, back pain, arthralgias and gait problem. Negative for joint swelling, neck pain and neck stiffness.  Neurological: Negative for dizziness, tremors, seizures, syncope, speech difficulty, weakness, light-headedness, numbness and headaches.    History  Substance Use Topics  . Smoking status: Former Smoker -- 0.50 packs/day for 5 years    Quit date: 01/23/1970  . Smokeless tobacco: Never Used  . Alcohol Use: No   Objective:   BP 146/72 mmHg  Pulse 64  Temp(Src) 97.8 F (36.6 C) (Oral)  Resp 16  Wt 140 lb (63.504 kg)  Physical Exam  Constitutional: She is oriented to person, place, and  time. She appears well-developed and well-nourished.  Cardiovascular: Normal rate, regular rhythm and intact distal pulses.   Pulmonary/Chest: Effort normal and breath sounds normal.  Musculoskeletal: She exhibits no edema. Tenderness: Mid back and down left leg. Leg is very tense with standing.   Neurological: She is alert and oriented to person, place, and time.  Psychiatric: She has a normal mood and affect. Her behavior is normal. Judgment and thought content normal.      Assessment & Plan:     1. Benign hypertension Elevated slightly  today. Monitor. Due for labs, will check. Follow up for Wellness.  - CBC with Differential/Platelet - Lipid panel - Comprehensive metabolic panel - TSH  2. Left hip pain Worsening. Check labs. Will consider referral to another orthopedic group.   3. Arthritis Worsening. Suspect OA, but will treat and check labs for inflammatory arthritis.  - Sed Rate (ESR) - Cyclic citrul peptide antibody, IgG - Rheumatoid Factor - predniSONE (DELTASONE) 10 MG tablet; 6 po for 2 days and then 5 po for 2 days and then 4 po for 2 days and 3 po for 2 days and then 2 po for 2 days and then 1 po for 2 days.  Dispense: 42 tablet; Refill: 0  4. Cerebral artery occlusion with cerebral infarction Check labs. Stable.  - Lipid panel       Margarita Rana, MD  [facility]

## 2014-09-05 ENCOUNTER — Telehealth: Payer: Self-pay

## 2014-09-05 LAB — SEDIMENTATION RATE: Sed Rate: 11 mm/hr (ref 0–40)

## 2014-09-05 LAB — COMPREHENSIVE METABOLIC PANEL
ALBUMIN: 4.1 g/dL (ref 3.5–4.8)
ALT: 18 IU/L (ref 0–32)
AST: 13 IU/L (ref 0–40)
Albumin/Globulin Ratio: 1.3 (ref 1.1–2.5)
Alkaline Phosphatase: 77 IU/L (ref 39–117)
BUN / CREAT RATIO: 29 — AB (ref 11–26)
BUN: 33 mg/dL — AB (ref 8–27)
Bilirubin Total: 0.3 mg/dL (ref 0.0–1.2)
CALCIUM: 10.3 mg/dL (ref 8.7–10.3)
CO2: 23 mmol/L (ref 18–29)
CREATININE: 1.12 mg/dL — AB (ref 0.57–1.00)
Chloride: 102 mmol/L (ref 97–108)
GFR calc Af Amer: 56 mL/min/{1.73_m2} — ABNORMAL LOW (ref >59)
GFR calc non Af Amer: 49 mL/min/{1.73_m2} — ABNORMAL LOW (ref >59)
GLOBULIN, TOTAL: 3.2 g/dL (ref 1.5–4.5)
Glucose: 108 mg/dL — ABNORMAL HIGH (ref 65–99)
Potassium: 5 mmol/L (ref 3.5–5.2)
Sodium: 143 mmol/L (ref 134–144)
Total Protein: 7.3 g/dL (ref 6.0–8.5)

## 2014-09-05 LAB — LIPID PANEL
CHOL/HDL RATIO: 3.5 ratio (ref 0.0–4.4)
CHOLESTEROL TOTAL: 188 mg/dL (ref 100–199)
HDL: 54 mg/dL (ref >39)
LDL Calculated: 109 mg/dL — ABNORMAL HIGH (ref 0–99)
Triglycerides: 125 mg/dL (ref 0–149)
VLDL CHOLESTEROL CAL: 25 mg/dL (ref 5–40)

## 2014-09-05 LAB — CBC WITH DIFFERENTIAL/PLATELET
Basophils Absolute: 0 10*3/uL (ref 0.0–0.2)
Basos: 0 %
EOS (ABSOLUTE): 0 10*3/uL (ref 0.0–0.4)
EOS: 0 %
Hematocrit: 41.3 % (ref 34.0–46.6)
Hemoglobin: 13.9 g/dL (ref 11.1–15.9)
IMMATURE GRANULOCYTES: 0 %
Immature Grans (Abs): 0 10*3/uL (ref 0.0–0.1)
Lymphocytes Absolute: 1.4 10*3/uL (ref 0.7–3.1)
Lymphs: 13 %
MCH: 29.8 pg (ref 26.6–33.0)
MCHC: 33.7 g/dL (ref 31.5–35.7)
MCV: 89 fL (ref 79–97)
MONOCYTES: 6 %
Monocytes Absolute: 0.6 10*3/uL (ref 0.1–0.9)
NEUTROS ABS: 8.2 10*3/uL — AB (ref 1.4–7.0)
NEUTROS PCT: 81 %
PLATELETS: 209 10*3/uL (ref 150–379)
RBC: 4.66 x10E6/uL (ref 3.77–5.28)
RDW: 14 % (ref 12.3–15.4)
WBC: 10.3 10*3/uL (ref 3.4–10.8)

## 2014-09-05 LAB — TSH: TSH: 0.654 u[IU]/mL (ref 0.450–4.500)

## 2014-09-05 LAB — RHEUMATOID FACTOR: Rhuematoid fact SerPl-aCnc: 8.7 IU/mL (ref 0.0–13.9)

## 2014-09-05 NOTE — Telephone Encounter (Signed)
-----   Message from Lorie Phenix, MD sent at 09/05/2014 10:37 AM EDT ----- Labs normal. Please notify patient. Thanks.

## 2014-09-05 NOTE — Telephone Encounter (Signed)
LMTCB Makynzi Eastland Drozdowski, CMA  

## 2014-09-08 NOTE — Telephone Encounter (Signed)
Advised pt as directed below, pt verbalized fully understanding.   Thanks,   

## 2014-09-15 ENCOUNTER — Encounter: Payer: Self-pay | Admitting: Family Medicine

## 2014-09-15 ENCOUNTER — Ambulatory Visit (INDEPENDENT_AMBULATORY_CARE_PROVIDER_SITE_OTHER): Payer: Medicare Other | Admitting: Family Medicine

## 2014-09-15 VITALS — BP 132/74 | HR 72 | Temp 98.7°F | Resp 16 | Wt 142.0 lb

## 2014-09-15 DIAGNOSIS — M1990 Unspecified osteoarthritis, unspecified site: Secondary | ICD-10-CM | POA: Diagnosis not present

## 2014-09-15 DIAGNOSIS — I10 Essential (primary) hypertension: Secondary | ICD-10-CM

## 2014-09-15 MED ORDER — AMLODIPINE BESYLATE 5 MG PO TABS: 5.0000 mg | ORAL_TABLET | Freq: Every day | ORAL | Status: DC

## 2014-09-15 MED ORDER — TRAMADOL HCL 50 MG PO TABS: 50.0000 mg | ORAL_TABLET | Freq: Four times a day (QID) | ORAL | Status: DC | PRN

## 2014-09-15 NOTE — Progress Notes (Signed)
Patient ID: Gina [name], female   DOB: 18-May-1940, 74 y.o.   MRN: [id number]         Patient: Gina [name] Female    DOB: 03/18/1940   74 y.o.   MRN: [id number] Visit Date: 09/15/2014  Today's Provider: Lorie Phenix, MD   No chief complaint on file.  Subjective:    Arthritis Presents for follow-up visit. She complains of pain and stiffness. She reports no joint swelling or joint warmth. Affected locations include the left hip. Associated symptoms include fatigue (Pt is wondering if the Metoprolol is causing her fatigue.). Pertinent negatives include no fever, pain at night or pain while resting. Her past medical history is significant for osteoarthritis. There is no history of rheumatoid arthritis.  Past treatments include OTC med and corticosteroids. The treatment provided no relief. Exacerbated by: Walking.       Allergies  Allergen Reactions  . Chlorthalidone Nausea Only  . Hydrochlorothiazide   . Lisinopril Cough  . Penicillins Rash   Previous Medications   ACETAMINOPHEN (TYLENOL) 325 MG TABLET    Take by mouth every 6 (six) hours as needed for pain.   AMLODIPINE (NORVASC) 5 MG TABLET    Take 5 mg by mouth daily.   ASPIRIN 325 MG TABLET    Take 325 mg by mouth daily.   CETIRIZINE (ZYRTEC) 10 MG TABLET    Take 10 mg by mouth daily.   METOPROLOL (LOPRESSOR) 100 MG TABLET    Take 100 mg by mouth 2 (two) times daily.   RANITIDINE (ZANTAC) 150 MG TABLET    Take 150 mg by mouth daily.   VITAMIN D, ERGOCALCIFEROL, PO    Take 1,000 mcg by mouth 2 (two) times daily.    Review of Systems  Constitutional: Positive for fatigue (Pt is wondering if the Metoprolol is causing her fatigue.). Negative for fever, chills, diaphoresis, activity change, appetite change and unexpected weight change.  Respiratory: Negative.   Cardiovascular: Negative.   Gastrointestinal: Negative.   Musculoskeletal: Positive for back pain, arthralgias, gait problem, arthritis and stiffness. Negative for myalgias,  joint swelling, neck pain and neck stiffness.  Neurological: Negative for dizziness, tremors, seizures, syncope, weakness, light-headedness, numbness and headaches.    Social History  Substance Use Topics  . Smoking status: Former Smoker -- 0.50 packs/day for 5 years    Quit date: 01/23/1970  . Smokeless tobacco: Never Used  . Alcohol Use: No   Objective:   BP 132/74 mmHg  Pulse 72  Temp(Src) 98.7 F (37.1 C) (Oral)  Resp 16  Wt 142 lb (64.411 kg)  Physical Exam  Constitutional: She is oriented to person, place, and time. She appears well-developed and well-nourished.  Neurological: She is alert and oriented to person, place, and time.  Psychiatric: She has a normal mood and affect. Her behavior is normal. Judgment and thought content normal.      Assessment & Plan:     1. Arthritis Will try tramadol and refer for further treatment.  - traMADol (ULTRAM) 50 MG tablet; Take 1 tablet (50 mg total) by mouth every 6 (six) hours as needed.  Dispense: 60 tablet; Refill: 0 - Ambulatory referral to Orthopedic Surgery  2. Benign hypertension Condition is stable. Please continue current medication and  plan of care as noted.   - amLODipine (NORVASC) 5 MG tablet; Take 1 tablet (5 mg total) by mouth daily.  Dispense: 90 tablet; Refill: 3      Lorie Phenix, MD  [facility]  [facility]  

## 2014-10-01 ENCOUNTER — Ambulatory Visit
Admission: RE | Admit: 2014-10-01 | Discharge: 2014-10-01 | Disposition: A | Discharge status: Home / Self Care | Payer: Medicare Other | Source: Ambulatory Visit | Attending: Orthopedic Surgery | Admitting: Orthopedic Surgery

## 2014-10-01 ENCOUNTER — Other Ambulatory Visit: Payer: Self-pay | Admitting: Orthopedic Surgery

## 2014-10-01 DIAGNOSIS — M5136 Other intervertebral disc degeneration, lumbar region: Secondary | ICD-10-CM | POA: Insufficient documentation

## 2014-10-01 DIAGNOSIS — M25552 Pain in left hip: Secondary | ICD-10-CM | POA: Insufficient documentation

## 2014-10-01 DIAGNOSIS — R52 Pain, unspecified: Secondary | ICD-10-CM

## 2014-10-02 ENCOUNTER — Other Ambulatory Visit: Payer: Self-pay | Admitting: Orthopedic Surgery

## 2014-10-02 DIAGNOSIS — M545 Low back pain: Secondary | ICD-10-CM

## 2014-10-12 ENCOUNTER — Other Ambulatory Visit: Payer: Self-pay

## 2014-10-18 ENCOUNTER — Ambulatory Visit
Admission: RE | Admit: 2014-10-18 | Discharge: 2014-10-18 | Disposition: A | Discharge status: Home / Self Care | Payer: Medicare Other | Source: Ambulatory Visit | Attending: Orthopedic Surgery | Admitting: Orthopedic Surgery

## 2014-10-18 DIAGNOSIS — M545 Low back pain: Secondary | ICD-10-CM

## 2014-12-04 DIAGNOSIS — M4806 Spinal stenosis, lumbar region: Secondary | ICD-10-CM | POA: Diagnosis not present

## 2014-12-04 DIAGNOSIS — M545 Low back pain: Secondary | ICD-10-CM | POA: Diagnosis not present

## 2014-12-09 ENCOUNTER — Encounter: Payer: Self-pay | Admitting: Family Medicine

## 2014-12-11 DIAGNOSIS — M4806 Spinal stenosis, lumbar region: Secondary | ICD-10-CM | POA: Diagnosis not present

## 2014-12-11 DIAGNOSIS — M545 Low back pain: Secondary | ICD-10-CM | POA: Diagnosis not present

## 2015-01-14 DIAGNOSIS — M545 Low back pain: Secondary | ICD-10-CM | POA: Diagnosis not present

## 2015-01-14 DIAGNOSIS — M25551 Pain in right hip: Secondary | ICD-10-CM | POA: Diagnosis not present

## 2015-01-14 DIAGNOSIS — M4806 Spinal stenosis, lumbar region: Secondary | ICD-10-CM | POA: Diagnosis not present

## 2015-02-06 DIAGNOSIS — M25551 Pain in right hip: Secondary | ICD-10-CM | POA: Diagnosis not present

## 2015-03-04 DIAGNOSIS — I10 Essential (primary) hypertension: Secondary | ICD-10-CM | POA: Diagnosis not present

## 2015-03-04 DIAGNOSIS — R002 Palpitations: Secondary | ICD-10-CM | POA: Diagnosis not present

## 2015-03-05 DIAGNOSIS — M545 Low back pain: Secondary | ICD-10-CM | POA: Diagnosis not present

## 2015-03-05 DIAGNOSIS — M4806 Spinal stenosis, lumbar region: Secondary | ICD-10-CM | POA: Diagnosis not present

## 2015-03-12 DIAGNOSIS — M545 Low back pain: Secondary | ICD-10-CM | POA: Diagnosis not present

## 2015-03-12 DIAGNOSIS — M4806 Spinal stenosis, lumbar region: Secondary | ICD-10-CM | POA: Diagnosis not present

## 2015-03-17 DIAGNOSIS — I10 Essential (primary) hypertension: Secondary | ICD-10-CM | POA: Insufficient documentation

## 2015-04-27 DIAGNOSIS — M4806 Spinal stenosis, lumbar region: Secondary | ICD-10-CM | POA: Diagnosis not present

## 2015-04-27 DIAGNOSIS — M545 Low back pain: Secondary | ICD-10-CM | POA: Diagnosis not present

## 2015-04-27 DIAGNOSIS — M25551 Pain in right hip: Secondary | ICD-10-CM | POA: Diagnosis not present

## 2015-05-01 DIAGNOSIS — M545 Low back pain: Secondary | ICD-10-CM | POA: Diagnosis not present

## 2015-05-01 DIAGNOSIS — M4806 Spinal stenosis, lumbar region: Secondary | ICD-10-CM | POA: Diagnosis not present

## 2015-07-13 DIAGNOSIS — I10 Essential (primary) hypertension: Secondary | ICD-10-CM | POA: Diagnosis not present

## 2015-07-13 DIAGNOSIS — M5416 Radiculopathy, lumbar region: Secondary | ICD-10-CM | POA: Diagnosis not present

## 2015-07-13 DIAGNOSIS — M4806 Spinal stenosis, lumbar region: Secondary | ICD-10-CM | POA: Diagnosis not present

## 2015-08-10 ENCOUNTER — Ambulatory Visit (INDEPENDENT_AMBULATORY_CARE_PROVIDER_SITE_OTHER): Payer: Medicare Other | Admitting: Family Medicine

## 2015-08-10 ENCOUNTER — Encounter: Payer: Self-pay | Admitting: Family Medicine

## 2015-08-10 VITALS — BP 140/72 | HR 68 | Temp 98.4°F | Resp 20 | Wt 146.0 lb

## 2015-08-10 DIAGNOSIS — L03011 Cellulitis of right finger: Secondary | ICD-10-CM

## 2015-08-10 MED ORDER — DOXYCYCLINE HYCLATE 100 MG PO TABS: 100.0000 mg | ORAL_TABLET | Freq: Two times a day (BID) | ORAL | Status: DC

## 2015-08-10 NOTE — Patient Instructions (Signed)
Try Epsom Salt soaks in warm water for 15 minutes daily. May try to clip or pull off the extra skin w after the soaks. Call if not improving with conservative treatment.

## 2015-08-10 NOTE — Progress Notes (Signed)
Subjective:     Patient ID: Gina [name], female   DOB: 11/10/1940, 75 y.o.   MRN: [id number]  HPI  Chief Complaint  Patient presents with  . Ingrown Toenail    right big toe  States she had a pedicure about a month ago and states that after that she though her nail was irritated. Denies specific injury or use of closed toed shoes. Has used nail clippers on 7/15 to this area with little improvement. Accompanied by her son today.   Review of Systems     Objective:   Physical Exam  Constitutional: She appears well-developed and well-nourished. No distress.  Skin:  Right first toe lateral nail bed with small amount of serosanguinous drainage when palpated; ? Granulation tissue/eschar in distal nail bed but appears partially avulsed. Nail is dystrophic.       Assessment:    1. Paronychia, right: ? Due to occult nail injury - doxycycline (VIBRA-TABS) 100 MG tablet; Take 1 tablet (100 mg total) by mouth 2 (two) times daily.  Dispense: 14 tablet; Refill: 0    Plan:    Discussed Epsom Salt soaks and pulling or clipping extraneous tissue off with clippers. Return if not improving.

## 2015-10-19 ENCOUNTER — Telehealth: Payer: Self-pay | Admitting: Family Medicine

## 2015-10-19 ENCOUNTER — Other Ambulatory Visit: Payer: Self-pay | Admitting: Family Medicine

## 2015-10-19 DIAGNOSIS — I10 Essential (primary) hypertension: Secondary | ICD-10-CM

## 2015-10-19 MED ORDER — AMLODIPINE BESYLATE 5 MG PO TABS: 5.0000 mg | ORAL_TABLET | Freq: Every day | ORAL | 3 refills | Status: DC

## 2015-10-19 NOTE — Telephone Encounter (Signed)
Pt contacted office for refill request on the following medications: amLODipine (NORVASC) 5 MG tablet [facility] [place] Last Written: 09/15/14 90 day supply with 3 refills Last OV: 08/10/15 Pt was seeing Dr. Elease HashimotoMaloney and saw Nadine CountsBob on 08/10/15. Please advise. Thanks TNP

## 2015-10-19 NOTE — Telephone Encounter (Signed)
Patient was advised she states that she does not know who will be her PCP here at practice, she states that she will call back later this week to schedule 3 month follow up. KW

## 2015-10-19 NOTE — Telephone Encounter (Signed)
Please review. KW 

## 2015-10-19 NOTE — Telephone Encounter (Signed)
I have refilled medication. Will need to identify her new provider and f/u in the next 3 months.

## 2015-10-23 ENCOUNTER — Ambulatory Visit: Payer: Medicare Other | Admitting: Family Medicine

## 2015-10-26 ENCOUNTER — Encounter: Payer: Self-pay | Admitting: Family Medicine

## 2015-10-26 ENCOUNTER — Ambulatory Visit (INDEPENDENT_AMBULATORY_CARE_PROVIDER_SITE_OTHER): Payer: Medicare Other | Admitting: Family Medicine

## 2015-10-26 VITALS — BP 144/86 | HR 62 | Temp 98.0°F | Resp 15 | Wt 142.8 lb

## 2015-10-26 DIAGNOSIS — E559 Vitamin D deficiency, unspecified: Secondary | ICD-10-CM | POA: Diagnosis not present

## 2015-10-26 DIAGNOSIS — E7800 Pure hypercholesterolemia, unspecified: Secondary | ICD-10-CM | POA: Diagnosis not present

## 2015-10-26 DIAGNOSIS — I10 Essential (primary) hypertension: Secondary | ICD-10-CM

## 2015-10-26 NOTE — Patient Instructions (Signed)
We will call you with the lab results. 

## 2015-10-26 NOTE — Progress Notes (Signed)
Subjective:     Patient ID: Gina ApleyMary E Clapham, female   DOB: 03/11/1940, 75 y.o.   MRN: [id number]  HPI  Chief Complaint  Patient presents with  . Hypertension    Patient comes in office today for hypertension follow up, last office visit was 09/15/14. Patients condition was stable at last visit, blood pressure in house was 132/74. Patient was advised to continue Amlodipine 5mg , she reports good compliance and tolerance on medication.   Continues to be followed by Dr. Lady GaryFath annually and ins pending f/u with neurosurgery, Dr. Lovell SheehanJenkins, for bilateral leg pain. She defers vaccines and mammograms.   Review of Systems  Respiratory: Negative for shortness of breath.   Cardiovascular: Negative for chest pain and palpitations.       Objective:   Physical Exam  Constitutional: She appears well-developed and well-nourished. No distress.  Cardiovascular: Normal rate and regular rhythm.   Pulmonary/Chest: Breath sounds normal.  Musculoskeletal: She exhibits no edema (of lower extremities).       Assessment:    1. Benign hypertension - Comprehensive metabolic panel  2. Avitaminosis D - VITAMIN D 25 Hydroxy (Vit-D Deficiency, Fractures)  3. Hypercholesteremia - Lipid panel    Plan:    Further f/u pending lab work.

## 2015-10-31 LAB — COMPREHENSIVE METABOLIC PANEL
A/G RATIO: 1.1 — AB (ref 1.2–2.2)
ALBUMIN: 4.1 g/dL (ref 3.5–4.8)
ALT: 23 IU/L (ref 0–32)
AST: 21 IU/L (ref 0–40)
Alkaline Phosphatase: 96 IU/L (ref 39–117)
BUN/Creatinine Ratio: 21 (ref 12–28)
BUN: 21 mg/dL (ref 8–27)
Bilirubin Total: 0.4 mg/dL (ref 0.0–1.2)
CALCIUM: 10 mg/dL (ref 8.7–10.3)
CO2: 24 mmol/L (ref 18–29)
Chloride: 101 mmol/L (ref 96–106)
Creatinine, Ser: 1.02 mg/dL — ABNORMAL HIGH (ref 0.57–1.00)
GFR, EST AFRICAN AMERICAN: 62 mL/min/{1.73_m2} (ref >59)
GFR, EST NON AFRICAN AMERICAN: 54 mL/min/{1.73_m2} — AB (ref >59)
GLOBULIN, TOTAL: 3.7 g/dL (ref 1.5–4.5)
Glucose: 107 mg/dL — ABNORMAL HIGH (ref 65–99)
POTASSIUM: 4.4 mmol/L (ref 3.5–5.2)
SODIUM: 141 mmol/L (ref 134–144)
Total Protein: 7.8 g/dL (ref 6.0–8.5)

## 2015-10-31 LAB — VITAMIN D 25 HYDROXY (VIT D DEFICIENCY, FRACTURES): Vit D, 25-Hydroxy: 11.5 ng/mL — ABNORMAL LOW (ref 30.0–100.0)

## 2015-10-31 LAB — LIPID PANEL
CHOL/HDL RATIO: 4.9 ratio — AB (ref 0.0–4.4)
CHOLESTEROL TOTAL: 194 mg/dL (ref 100–199)
HDL: 40 mg/dL (ref >39)
LDL Calculated: 108 mg/dL — ABNORMAL HIGH (ref 0–99)
TRIGLYCERIDES: 231 mg/dL — AB (ref 0–149)
VLDL Cholesterol Cal: 46 mg/dL — ABNORMAL HIGH (ref 5–40)

## 2015-11-03 ENCOUNTER — Telehealth: Payer: Self-pay

## 2015-11-03 MED ORDER — VITAMIN D (ERGOCALCIFEROL) 1.25 MG (50000 UNIT) PO CAPS: 50000.0000 [IU] | ORAL_CAPSULE | ORAL | 1 refills | Status: DC

## 2015-11-03 NOTE — Telephone Encounter (Signed)
Pt advised. RX sent in. Allene DillonEmily Drozdowski, CMA

## 2015-11-03 NOTE — Telephone Encounter (Signed)
-----   Message from Malva Limesonald E Fisher, MD sent at 11/03/2015  3:47 PM EDT ----- Vitamin d levels are very low. She needs to start taking vitamin d 50,000 units once a week, #12, rf x 1. Cholesterol slightly high, other labs normal. Need to schedule follow up with bob regarding vitamin d in 3 month.

## 2015-11-19 ENCOUNTER — Other Ambulatory Visit: Payer: Self-pay | Admitting: Neurosurgery

## 2015-11-19 DIAGNOSIS — M5416 Radiculopathy, lumbar region: Secondary | ICD-10-CM

## 2015-11-30 ENCOUNTER — Ambulatory Visit
Admission: RE | Admit: 2015-11-30 | Discharge: 2015-11-30 | Disposition: A | Discharge status: Home / Self Care | Payer: Medicare Other | Source: Ambulatory Visit | Attending: Neurosurgery | Admitting: Neurosurgery

## 2015-11-30 DIAGNOSIS — M5416 Radiculopathy, lumbar region: Secondary | ICD-10-CM

## 2015-12-03 ENCOUNTER — Other Ambulatory Visit: Payer: Self-pay | Admitting: Neurosurgery

## 2015-12-03 DIAGNOSIS — M5416 Radiculopathy, lumbar region: Secondary | ICD-10-CM

## 2015-12-22 ENCOUNTER — Other Ambulatory Visit: Payer: Medicare Other

## 2016-01-05 ENCOUNTER — Other Ambulatory Visit: Payer: Medicare Other

## 2016-01-28 ENCOUNTER — Inpatient Hospital Stay
Admission: RE | Admit: 2016-01-28 | Discharge: 2016-01-28 | Disposition: A | Discharge status: Home / Self Care | Payer: Medicare Other | Source: Ambulatory Visit | Attending: Neurosurgery | Admitting: Neurosurgery

## 2016-01-28 ENCOUNTER — Other Ambulatory Visit: Payer: Medicare Other

## 2016-01-28 NOTE — Discharge Instructions (Signed)

## 2016-02-03 ENCOUNTER — Ambulatory Visit: Payer: Self-pay | Admitting: Family Medicine

## 2016-02-05 ENCOUNTER — Ambulatory Visit
Admission: RE | Admit: 2016-02-05 | Discharge: 2016-02-05 | Disposition: A | Discharge status: Home / Self Care | Payer: Medicare Other | Source: Ambulatory Visit | Attending: Neurosurgery | Admitting: Neurosurgery

## 2016-02-05 DIAGNOSIS — M5416 Radiculopathy, lumbar region: Secondary | ICD-10-CM

## 2016-02-05 MED ORDER — IOPAMIDOL (ISOVUE-M 200) INJECTION 41%
15.0000 mL | Freq: Once | INTRAMUSCULAR | Status: AC
  Administered 2016-02-05: 15 mL via INTRATHECAL

## 2016-02-05 MED ORDER — DIAZEPAM 5 MG PO TABS
5.0000 mg | ORAL_TABLET | Freq: Once | ORAL | Status: AC
  Administered 2016-02-05: 5 mg via ORAL

## 2016-02-05 NOTE — Discharge Instructions (Addendum)
Myelogram Discharge Instructions  1. Go home and rest quietly for the next 24 hours.  It is important to lie flat for the next 24 hours.  Get up only to go to the restroom.  You may lie in the bed or on a couch on your back, your stomach, your left side or your right side.  You may have one pillow under your head.  You may have pillows between your knees while you are on your side or under your knees while you are on your back.  2. DO NOT drive today.  Recline the seat as far back as it will go, while still wearing your seat belt, on the way home.  3. You may get up to go to the bathroom as needed.  You may sit up for 10 minutes to eat.  You may resume your normal diet and medications unless otherwise indicated.  Drink plenty of extra fluids today and tomorrow.  4. The incidence of a spinal headache with nausea and/or vomiting is about 5% (one in 20 patients).  If you develop a headache, lie flat and drink plenty of fluids until the headache goes away.  Caffeinated beverages may be helpful.  If you develop severe nausea and vomiting or a headache that does not go away with flat bed rest, call [phone].  5. You may [phone] normal activities after your 24 hours of bed rest is over; however, do not exert yourself strongly or do any heavy lifting tomorrow.  6. Call Dr. Lovell SheehanJenkins for a follow-up appointment, [phone].

## 2016-02-08 ENCOUNTER — Telehealth: Payer: Self-pay

## 2016-02-08 NOTE — Telephone Encounter (Signed)
Spoke with patient after her myelogram here 02/05/16.  She states she did really well and did not get a headache after the bedrest.  jkl

## 2016-04-25 ENCOUNTER — Ambulatory Visit: Payer: Self-pay | Admitting: Physician Assistant

## 2016-04-25 ENCOUNTER — Ambulatory Visit: Payer: Medicare Other | Admitting: Family Medicine

## 2016-04-26 ENCOUNTER — Ambulatory Visit: Payer: Self-pay | Admitting: Physician Assistant

## 2016-04-26 ENCOUNTER — Ambulatory Visit: Payer: Medicare Other

## 2016-05-17 ENCOUNTER — Ambulatory Visit: Payer: Self-pay | Admitting: Physician Assistant

## 2016-05-25 ENCOUNTER — Ambulatory Visit: Payer: Self-pay | Admitting: Physician Assistant

## 2016-05-25 ENCOUNTER — Ambulatory Visit: Payer: Self-pay

## 2016-06-30 ENCOUNTER — Ambulatory Visit: Payer: Self-pay | Admitting: Physician Assistant

## 2016-06-30 ENCOUNTER — Ambulatory Visit: Payer: Self-pay

## 2016-07-25 ENCOUNTER — Encounter: Payer: Self-pay | Admitting: Pain Medicine

## 2016-07-25 DIAGNOSIS — G894 Chronic pain syndrome: Secondary | ICD-10-CM | POA: Insufficient documentation

## 2016-07-25 DIAGNOSIS — Z79899 Other long term (current) drug therapy: Secondary | ICD-10-CM | POA: Insufficient documentation

## 2016-07-25 DIAGNOSIS — F1190 Opioid use, unspecified, uncomplicated: Secondary | ICD-10-CM | POA: Insufficient documentation

## 2016-07-25 NOTE — Progress Notes (Deleted)
Patient's Name: Gina [name]  MRN: [id number]  Referring Provider: Newman Pies, MD  DOB: August 28, 1940  PCP: Mar Daring, PA-C  DOS: 07/26/2016  Note by: Kathlen Brunswick. Dossie Arbour, MD  Service setting: Ambulatory outpatient  Specialty: Interventional Pain Management  Location: [facility] (AMB) Pain Management Facility  Visit type: Initial Patient Evaluation  Patient type: New Patient   Primary Reason(s) for Visit: Encounter for initial evaluation of one or more chronic problems (new to examiner) potentially causing chronic pain, and posing a threat to normal musculoskeletal function. (Level of risk: High) CC: No chief complaint on file.  HPI  Gina [name] is a 76 y.o. year old, female patient, who comes today to see [place] for the first time for an initial evaluation of her chronic pain. She has Allergic rhinitis; Appendicular ataxia; Cerebral artery occlusion with cerebral infarction ([place]); Acid reflux; Personal history of transient ischemic attack (TIA) and cerebral infarction without residual deficit; Hypercholesteremia; Mononeuropathy of lower extremity; Disorder of mitral valve; Nonrheumatic tricuspid valve disorder; Beat, premature ventricular; Adnexal mass; Avitaminosis D; Arthritis; Hypertension, essential; Palpitations; Long-term use of high-risk medication; Chronic pain syndrome; and Opiate use on her problem list. Today she comes in for evaluation of her No chief complaint on file.  Pain Assessment: Location:     Radiating:   Onset:   Duration:   Quality:   Severity:  /10 (self-reported pain score)  Note: Reported level is compatible with observation.                   Effect on ADL:   Timing:   Modifying factors:    Onset and Duration: {Hx; Onset and Duration:210120511} Cause of pain: {Hx; Cause:210120521} Severity: {Pain Severity:210120502} Timing: {Symptoms; Timing:210120501} Aggravating Factors: {Causes; Aggravating pain factors:210120507} Alleviating Factors: {Causes; Alleviating  Factors:210120500} Associated Problems: {Hx; Associated problems:210120515} Quality of Pain: {Hx; Symptom quality or Descriptor:210120531} Previous Examinations or Tests: {Hx; Previous examinations or test:210120529} Previous Treatments: {Hx; Previous Treatment:210120503}  The patient comes into the clinics today for the first time for a chronic pain management evaluation. ***  Today I took the time to provide the patient with information regarding my pain practice. The patient was informed that my practice is divided into two sections: an interventional pain management section, as well as a completely separate and distinct medication management section. I explained that I have procedure days for my interventional therapies, and evaluation days for follow-ups and medication management. Because of the amount of documentation required during both, they are kept separated. This means that there is the possibility that she may be scheduled for a procedure on one day, and medication management the next. I have also informed her that because of staffing and facility limitations, I no longer take patients for medication management only. To illustrate the reasons for this, I gave the patient the example of surgeons, and how inappropriate it would be to refer a patient to his/her care, just to write for the post-surgical antibiotics on a surgery done by a different surgeon.   Because interventional pain management is my board-certified specialty, the patient was informed that joining my practice means that they are open to any and all interventional therapies. I made it clear that this does not mean that they will be forced to have any procedures done. What this means is that I believe interventional therapies to be essential part of the diagnosis and proper management of chronic pain conditions. Therefore, patients not interested in these interventional alternatives will be better served under  the care of a  different practitioner.  The patient was also made aware of my Comprehensive Pain Management Safety Guidelines where by joining my practice, they limit all of their nerve blocks and joint injections to those done by our practice, for as long as we are retained to manage their care.   Historic Controlled Substance Pharmacotherapy Review  PMP and historical list of controlled substances: Tramadol 50 mg; diazepam 5 mg; hydrocodone/APAP 5/325; diazepam 10 mg; hydrocodone/APAP 5/500 Highest opioid analgesic regimen found: Hydrocodone/APAP 5/325 2 tablets by mouth 5 times a day. (50 mg/day of hydrocodone) (50 MME/Day) Most recent opioid analgesic: Tramadol 50 mg 1 tablet by mouth twice a day (100 mg/day of tramadol) (10 MME/Day) Current opioid analgesics: None Highest recorded MME/day: 50 mg/day MME/day: 0 mg/day Medications: The patient did not bring the medication(s) to the appointment, as requested in our "New Patient Package" Pharmacodynamics: Desired effects: Analgesia: The patient reports >50% benefit. Reported improvement in function: The patient reports medication allows her to accomplish basic ADLs. Clinically meaningful improvement in function (CMIF): Sustained CMIF goals met Perceived effectiveness: Described as relatively effective, allowing for increase in activities of daily living (ADL) Undesirable effects: Side-effects or Adverse reactions: None reported Historical Monitoring: The patient  reports that she does not use drugs. List of all UDS Test(s): No results found for: MDMA, COCAINSCRNUR, PCPSCRNUR, PCPQUANT, CANNABQUANT, THCU, ETH List of all Serum Drug Screening Test(s):  No results found for: AMPHSCRSER, BARBSCRSER, BENZOSCRSER, COCAINSCRSER, PCPSCRSER, PCPQUANT, THCSCRSER, CANNABQUANT, OPIATESCRSER, OXYSCRSER, PROPOXSCRSER Historical Background Evaluation: [place] PDMP: Six (6) year initial data search conducted.             [place] Department of public safety, offender search:  (Public Information) Non-contributory Risk Assessment Profile: Aberrant behavior: None observed or detected today Risk factors for fatal opioid overdose: None identified today Fatal overdose hazard ratio (HR): Calculation deferred Non-fatal overdose hazard ratio (HR): Calculation deferred Risk of opioid abuse or dependence: 0.7-3.0% with doses ? 36 MME/day and 6.1-26% with doses ? 120 MME/day. Substance use disorder (SUD) risk level: Pending results of Medical Psychology Evaluation for SUD Opioid risk tool (ORT) (Total Score):    ORT Scoring interpretation table:  Score <3 = Low Risk for SUD  Score between 4-7 = Moderate Risk for SUD  Score >8 = High Risk for Opioid Abuse   PHQ-2 Depression Scale:  Total score:    PHQ-2 Scoring interpretation table: (Score and probability of major depressive disorder)  Score 0 = No depression  Score 1 = 15.4% Probability  Score 2 = 21.1% Probability  Score 3 = 38.4% Probability  Score 4 = 45.5% Probability  Score 5 = 56.4% Probability  Score 6 = 78.6% Probability   PHQ-9 Depression Scale:  Total score:    PHQ-9 Scoring interpretation table:  Score 0-4 = No depression  Score 5-9 = Mild depression  Score 10-14 = Moderate depression  Score 15-19 = Moderately severe depression  Score 20-27 = Severe depression (2.4 times higher risk of SUD and 2.89 times higher risk of overuse)   Pharmacologic Plan: Pending ordered tests and/or consults            Initial impression: Pending review of available data and ordered tests.  Meds   No outpatient prescriptions have been marked as taking for the 07/26/16 encounter (Appointment) with , , MD.    Imaging Review  Lumbosacral Imaging: Lumbar MR wo contrast:  Results for orders placed during the hospital encounter of 11/30/15  MR LUMBAR SPINE   WO CONTRAST   Narrative CLINICAL DATA:  76 year old female with low back pain. Left leg pain and numbness. Subsequent encounter.  EXAM: MRI LUMBAR  SPINE WITHOUT CONTRAST  TECHNIQUE: Multiplanar, multisequence MR imaging of the lumbar spine was performed. No intravenous contrast was administered.  COMPARISON:  10/18/2014 MR.  FINDINGS: Segmentation: Last fully open disk space is labeled L5-S1. Present examination incorporates from T11-12 disc space through the lower sacrum.  Alignment:  Curvature of the lumbar spine convex right.  Vertebrae:  No acute vertebral abnormality.  Conus medullaris: Extends to the L1-2 level and appears normal.  Paraspinal and other soft tissues: Mild ectasia abdominal aorta and right common iliac artery.  Disc levels:  T11-12:  Minimal bulge.  Axial imaging not obtained.  T12-L1:  Negative.  L1-2:  Minimal bulge.  Minimal facet degenerative changes.  L2-3: Bulge greater to left. Facet degenerative changes greater on left. Mild to moderate left lateral thecal sac narrowing without compression of the contained nerve roots.  L3-4: Prominent facet degenerative changes. Ligamentum flavum hypertrophy. Rotation and bulge greater to left. Moderate narrowing lateral aspect of thecal sac greater on left. Left foraminal/ lateral extension of bulge with slight encroachment upon but not compression of the exiting left L3 nerve root.  L4-5: Mild facet degenerative changes. Bulge with right lateral spur without nerve root compression. Mild spinal stenosis.  L5-S1: Right lateral disc/osteophyte with mild compression of the exiting right L5 nerve root in a far lateral position.  IMPRESSION: Scoliosis lumbar spine convex right. Minimal progression of superimposed degenerative changes when compared to the prior exam. Summary pertinent findings includes:  L2-3 multifactorial mild to moderate left lateral thecal sac narrowing without compression of the contained nerve roots.  L3-4 multifactorial moderate narrowing lateral aspect of thecal sac greater on left. Left foraminal/ lateral extension of  bulge with slight encroachment upon but not compression of the exiting left L3 nerve root.  L4-5 mild spinal stenosis.  L5-S1 right lateral disc/osteophyte with mild compression of the exiting right L5 nerve root in a far lateral position.   Electronically Signed   By: Genia Del M.D.   On: 11/30/2015 16:33    Lumbar CT w contrast:  Results for orders placed during the hospital encounter of 02/05/16  CT LUMBAR SPINE W CONTRAST   Narrative CLINICAL DATA:  Low back pain extending into the hips. Pain is alternating to the right and left hip. Left total hip arthroplasty.  EXAM: LUMBAR MYELOGRAM  FLUOROSCOPY TIME:  Radiation Exposure Index (as provided by the fluoroscopic device): 268.79 uGy*m2  If the device does not provide the exposure index:  Fluoroscopy Time:  28 seconds  Number of Acquired Images:  15  PROCEDURE: After thorough discussion of risks and benefits of the procedure including bleeding, infection, injury to nerves, blood vessels, adjacent structures as well as headache and CSF leak, written and oral informed consent was obtained. Consent was obtained by Dr. San Morelle. Time out form was completed.  Patient was positioned prone on the fluoroscopy table. Local anesthesia was provided with 1% lidocaine without epinephrine after prepped and draped in the usual sterile fashion. Puncture was performed at L5-S1 using a 3 1/2 inch 22-gauge spinal needle via left paramedian approach. Using a single pass through the dura, the needle was placed within the thecal sac, with return of clear CSF. 15 mL of Isovue-M 200 was injected into the thecal sac, with normal opacification of the nerve roots and cauda equina consistent with free flow within the  subarachnoid space.  I personally performed the lumbar puncture and administered the intrathecal contrast. I also personally supervised acquisition of the myelogram images.  TECHNIQUE: Contiguous axial images  were obtained through the Lumbar spine after the intrathecal infusion of infusion. Coronal and sagittal reconstructions were obtained of the axial image sets.  COMPARISON:  MRI of the lumbar spine 11/30/2015  FINDINGS: LUMBAR MYELOGRAM FINDINGS:  A transitional L1 segment is noted. Rightward curvature of the lumbar spine is centered at L2. Asymmetric leftward disc disease and endplate changes present at L2-3 and L3-4 with left-sided subarticular narrowing at both these levels. Mild subarticular narrowing is present on the right at L4-5. The lower nerve roots otherwise fill normally.  Standing images demonstrate slight exaggeration of the L3-4 and L4-5 disc protrusions. There is no significant change and disc protrusions or alignment through a limited range of flexion and extension.  Atherosclerotic calcifications are present in the aorta and iliac vessels without definite aneurysm.  CT LUMBAR MYELOGRAM FINDINGS:  Five lumbar type vertebral bodies are present. AP alignment is anatomic. Rightward curvature of the lumbar spine is centered at L2. There is compensatory leftward curvature at L5-S1.  Limited imaging of the abdomen demonstrates atherosclerotic calcifications in the aorta and branch vessels without aneurysm. No solid organ lesions are evident. There is no significant adenopathy.  L1-2: There is mild asymmetry of facet hypertrophy on the left without significant stenosis.  L2-3: A a leftward disc protrusion and asymmetric facet hypertrophy results in mild left subarticular narrowing. The disc extends to the left foramen without significant foraminal stenosis.  L3-4: A leftward disc protrusion is present. Asymmetric moderate left-sided facet hypertrophy is noted. This results in moderate left and mild right subarticular narrowing. Moderate left foraminal stenosis is present.  L4-5: A mild broad-based disc protrusion is present. Facet hypertrophy is noted  bilaterally. Mild left subarticular narrowing is present. The foramina are patent bilaterally.  L5-S1: A far right lateral disc protrusion and endplate osteophyte formation is noted. This results in moderate right foraminal narrowing. The central canal and left foramen are patent.  IMPRESSION: 1. Dextroconvex curvature of the lumbar spine is centered at L2-3 with asymmetric disc disease and endplate change on the left at L2-3 and L3-4. 2. Mild left subarticular narrowing at L2-3. 3. Moderate left and mild right subarticular narrowing at L3-4. Moderate left foraminal stenosis is present. 4. Mild left subarticular narrowing at L4-5. 5. Moderate right foraminal stenosis at L5-S1 secondary to a far right lateral disc protrusion and asymmetric endplate osteophyte formation. 6. The disc protrusions at L3-4 and L4-5 for exaggerated with standing. 7. Atherosclerosis.   Electronically Signed   By: Christopher  Mattern M.D.   On: 02/05/2016 11:50    Lumbar DG 2-3 views:  Results for orders placed during the hospital encounter of 10/01/14  DG Lumbar Spine 2-3 Views   Narrative CLINICAL DATA:  Low back and left hip pain for 6 months without known injury.  EXAM: LUMBAR SPINE - 2-3 VIEW  COMPARISON:  None.  FINDINGS: Moderate dextroscoliosis of upper lumbar spine is noted. Diffuse osteopenia is noted. No fracture or spondylolisthesis is noted. Mild degenerative disc disease is noted at L2-3, L3-4 and L5-S1. Atherosclerosis of abdominal aorta is noted.  IMPRESSION: Multilevel degenerative disc disease is noted. No acute abnormality seen in the lumbar spine.   Electronically Signed   By: James  Green Jr, M.D.   On: 10/01/2014 15:36    Lumbar DG Myelogram Lumbosacral:  Results for orders placed during the   hospital encounter of 02/05/16  DG MYELOGRAPHY LUMBAR INJ LUMBOSACRAL   Narrative CLINICAL DATA:  Low back pain extending into the hips. Pain is alternating to the right  and left hip. Left total hip arthroplasty.  EXAM: LUMBAR MYELOGRAM  FLUOROSCOPY TIME:  Radiation Exposure Index (as provided by the fluoroscopic device): 268.79 uGy*m2  If the device does not provide the exposure index:  Fluoroscopy Time:  28 seconds  Number of Acquired Images:  15  PROCEDURE: After thorough discussion of risks and benefits of the procedure including bleeding, infection, injury to nerves, blood vessels, adjacent structures as well as headache and CSF leak, written and oral informed consent was obtained. Consent was obtained by Dr. Christopher Mattern. Time out form was completed.  Patient was positioned prone on the fluoroscopy table. Local anesthesia was provided with 1% lidocaine without epinephrine after prepped and draped in the usual sterile fashion. Puncture was performed at L5-S1 using a 3 1/2 inch 22-gauge spinal needle via left paramedian approach. Using a single pass through the dura, the needle was placed within the thecal sac, with return of clear CSF. 15 mL of Isovue-M 200 was injected into the thecal sac, with normal opacification of the nerve roots and cauda equina consistent with free flow within the subarachnoid space.  I personally performed the lumbar puncture and administered the intrathecal contrast. I also personally supervised acquisition of the myelogram images.  TECHNIQUE: Contiguous axial images were obtained through the Lumbar spine after the intrathecal infusion of infusion. Coronal and sagittal reconstructions were obtained of the axial image sets.  COMPARISON:  MRI of the lumbar spine 11/30/2015  FINDINGS: LUMBAR MYELOGRAM FINDINGS:  A transitional L1 segment is noted. Rightward curvature of the lumbar spine is centered at L2. Asymmetric leftward disc disease and endplate changes present at L2-3 and L3-4 with left-sided subarticular narrowing at both these levels. Mild subarticular narrowing is present on the right at  L4-5. The lower nerve roots otherwise fill normally.  Standing images demonstrate slight exaggeration of the L3-4 and L4-5 disc protrusions. There is no significant change and disc protrusions or alignment through a limited range of flexion and extension.  Atherosclerotic calcifications are present in the aorta and iliac vessels without definite aneurysm.  CT LUMBAR MYELOGRAM FINDINGS:  Five lumbar type vertebral bodies are present. AP alignment is anatomic. Rightward curvature of the lumbar spine is centered at L2. There is compensatory leftward curvature at L5-S1.  Limited imaging of the abdomen demonstrates atherosclerotic calcifications in the aorta and branch vessels without aneurysm. No solid organ lesions are evident. There is no significant adenopathy.  L1-2: There is mild asymmetry of facet hypertrophy on the left without significant stenosis.  L2-3: A a leftward disc protrusion and asymmetric facet hypertrophy results in mild left subarticular narrowing. The disc extends to the left foramen without significant foraminal stenosis.  L3-4: A leftward disc protrusion is present. Asymmetric moderate left-sided facet hypertrophy is noted. This results in moderate left and mild right subarticular narrowing. Moderate left foraminal stenosis is present.  L4-5: A mild broad-based disc protrusion is present. Facet hypertrophy is noted bilaterally. Mild left subarticular narrowing is present. The foramina are patent bilaterally.  L5-S1: A far right lateral disc protrusion and endplate osteophyte formation is noted. This results in moderate right foraminal narrowing. The central canal and left foramen are patent.  IMPRESSION: 1. Dextroconvex curvature of the lumbar spine is centered at L2-3 with asymmetric disc disease and endplate change on the left at L2-3 and L3-4.   2. Mild left subarticular narrowing at L2-3. 3. Moderate left and mild right subarticular narrowing at  L3-4. Moderate left foraminal stenosis is present. 4. Mild left subarticular narrowing at L4-5. 5. Moderate right foraminal stenosis at L5-S1 secondary to a far right lateral disc protrusion and asymmetric endplate osteophyte formation. 6. The disc protrusions at L3-4 and L4-5 for exaggerated with standing. 7. Atherosclerosis.   Electronically Signed   By: Christopher  Mattern M.D.   On: 02/05/2016 11:50    Hip Imaging: Hip-L MR wo contrast:  Results for orders placed during the hospital encounter of 07/27/10  MR Hip Left Wo Contrast   Narrative *RADIOLOGY REPORT*  Clinical Data: Left hip pain.  Left hip osteoarthritis.  Labral tear versus fracture.  MRI OF THE LEFT HIP WITHOUT CONTRAST  Technique:  Multiplanar, multisequence MR imaging was performed. No intravenous contrast was administered.  Comparison: None.  Findings: There is diffuse bone marrow edema in the left femoral head radiating away from the articular surface.  There is a subchondral sclerotic area in the anterior femoral head most compatible with avascular necrosis which is favored over a subchondral insufficiency fracture.  There is no fracture of the femoral neck however bone marrow edema does radiate to the left femoral neck.  Reactive left hip effusion.  Mild left hip osteoarthritis with acetabular and femoral head chondromalacia. Incidental imaging of the right hip is unremarkable.  Sacral Tarlov cyst noted.  Bone marrow signal in the sacrum and pelvic bones is within normal limits.  Pubic symphysial degenerative disease. Common hamstring origins normal.  Reactive edema is present around the left hip.  The visceral pelvis demonstrates a septated cystic lesion in the left posterior anatomic pelvis measuring 63 mm x 44 mm x 47 mm.  IMPRESSION: 1.  Left femoral head subchondral sclerosis anteriorly and radiating bone marrow edema is most compatible with avascular necrosis.  Subchondral insufficiency  fracture given secondary consideration.  Mild left hip osteoarthritis. 2.  Reactive left hip effusion.  Mild reactive soft tissue edema around the left hip. 3.  Left anatomic pelvis cystic lesion measuring 63 mm x 44 mm x 47 mm.  Findings suspicious for cystic ovarian neoplasm.  Follow up transvaginal ultrasound recommended to further assess.  Gynecology evaluation may be necessary depending on the outcome of ultrasound.  Original Report Authenticated By: GEOFFREY LAMKE, M.D.   Hip-L DG 2-3 views:  Results for orders placed during the hospital encounter of 10/01/14  DG HIP UNILAT WITH PELVIS 2-3 VIEWS LEFT   Narrative CLINICAL DATA:  Left hip pain  EXAM: DG HIP (WITH OR WITHOUT PELVIS) 2-3V LEFT  COMPARISON:  07/14/2011  FINDINGS: Left hip replacement in satisfactory position alignment and unchanged from the prior study. No loosening or fracture. Mild degenerative change in the right hip. No pelvic fracture.  IMPRESSION: Satisfactory left hip replacement.  No acute abnormality.   Electronically Signed   By: Charles  Clark M.D.   On: 10/01/2014 15:35    Complexity Note: Imaging results reviewed. Results discussed using Layman's terms.               ROS  Cardiovascular History: {Hx; Cardiovascular History:210120525} Pulmonary or Respiratory History: {Hx; Pumonary and/or Respiratory History:210120523} Neurological History: {Hx; Neurological:210120504} Review of Past Neurological Studies: No results found for this or any previous visit. Psychological-Psychiatric History: {Hx; Psychological-Psychiatric History:210120512} Gastrointestinal History: {Hx; Gastrointestinal:210120527} Genitourinary History: {Hx; Genitourinary:210120506} Hematological History: {Hx; Hematological:210120510} Endocrine History: {Hx; Endocrine history:210120509} Rheumatologic History: {Hx; Rheumatological:210120530} Musculoskeletal History: {Hx; Musculoskeletal:210120528} Work History: {  Hx; Work  history:210120514}  Allergies  Ms. Biswas is allergic to hydrochlorothiazide; chlorthalidone; lisinopril; and penicillins.  Laboratory Chemistry  Inflammation Markers (CRP: Acute Phase) (ESR: Chronic Phase) Lab Results  Component Value Date   ESRSEDRATE 11 09/04/2014                 Renal Function Markers Lab Results  Component Value Date   BUN 21 10/30/2015   CREATININE 1.02 (H) 10/30/2015   GFRAA 62 10/30/2015   GFRNONAA 54 (L) 10/30/2015                 Hepatic Function Markers Lab Results  Component Value Date   AST 21 10/30/2015   ALT 23 10/30/2015   ALBUMIN 4.1 10/30/2015   ALKPHOS 96 10/30/2015                 Electrolytes Lab Results  Component Value Date   NA 141 10/30/2015   K 4.4 10/30/2015   CL 101 10/30/2015   CALCIUM 10.0 10/30/2015                 Neuropathy Markers No results found for: VITAMINB12               Bone Pathology Markers Lab Results  Component Value Date   ALKPHOS 96 10/30/2015   VD25OH 11.5 (L) 10/30/2015   CALCIUM 10.0 10/30/2015                 Coagulation Parameters Lab Results  Component Value Date   INR 1.0 06/28/2011   LABPROT 13.2 06/28/2011   APTT 29.8 06/28/2011   PLT 209 09/04/2014                 Cardiovascular Markers Lab Results  Component Value Date   HGB 13.9 09/04/2014   HCT 41.3 09/04/2014                 Note: Lab results reviewed.  PFSH  Drug: Ms. Mingle  reports that she does not use drugs. Alcohol:  reports that she does not drink alcohol. Tobacco:  reports that she quit smoking about 46 years ago. She has a 2.50 pack-year smoking history. She has never used smokeless tobacco. Medical:  has a past medical history of Abnormal blood sugar (08/03/2007); Hypertension (2010); and Mini stroke (HCC) (2010, 2014). Family: family history includes Congestive Heart Failure (age of onset: 78) in her mother; Heart disease (age of onset: 52) in her father; Heart failure (age of onset: 77) in her brother; Rheumatic  fever in her father and sister.  Past Surgical History:  Procedure Laterality Date  . BREAST SURGERY Left 1982?   biopsy  . CESAREAN SECTION    . HERNIA REPAIR Left 2014   Laparoscopic placement  10 x 15 cm Physiomesh at lateral abdominal wall port site hernia  . HIP SURGERY Left 2013  . OOPHORECTOMY  2013   Active Ambulatory Problems    Diagnosis Date Noted  . Allergic rhinitis 06/13/2008  . 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 Cerebral artery occlusion with cerebral infarction (HCC) 05/04/2009  . Acid reflux 08/04/2007  . Personal history of transient ischemic attack (TIA) and cerebral infarction without residual deficit 07/18/2014  . Hypercholesteremia 08/03/2007  . Mononeuropathy of lower extremity 07/18/2014  . Disorder of mitral valve 04/21/2009  . 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 Beat, premature ventricular 09/22/2008  . Adnexal mass 07/18/2014  . Avitaminosis D 07/18/2014  . Arthritis 09/01/2014  . Hypertension, essential   03/17/2015  . Palpitations 06/24/2013  . Long-term use of high-risk medication 07/25/2016  . Chronic pain syndrome 07/25/2016  . Opiate use 07/25/2016   Resolved Ambulatory Problems    Diagnosis Date Noted  . Incisional hernia, without obstruction or gangrene 05/03/2012  . Hernia of abdominal wall 08/02/2012  . Abnormal blood sugar 08/03/2007  . Abnormal liver enzymes 07/18/2014  . Calcium blood increased 11/21/2008  . Hernia of anterior abdominal wall 08/02/2012  . Hernia, incisional 05/03/2012  . Impacted cerumen of right ear 07/18/2014  . Left hip pain 09/01/2014   Past Medical History:  Diagnosis Date  . Abnormal blood sugar 08/03/2007  . Hypertension 2010  . Mini stroke (HCC) 2010, 2014   Constitutional Exam  General appearance: Well nourished, well developed, and well hydrated. In no apparent acute distress There were no vitals filed for this visit. BMI Assessment: Estimated body mass index is 24.51 kg/m as  calculated from the following:   Height as of 07/18/14: 5' 4" (1.626 m).   Weight as of 10/26/15: 142 lb 12.8 oz (64.8 kg).  BMI interpretation table: BMI level Category Range association with higher incidence of chronic pain  <18 kg/m2 Underweight   18.5-24.9 kg/m2 Ideal body weight   25-29.9 kg/m2 Overweight Increased incidence by 20%  30-34.9 kg/m2 Obese (Class I) Increased incidence by 68%  35-39.9 kg/m2 Severe obesity (Class II) Increased incidence by 136%  >40 kg/m2 Extreme obesity (Class III) Increased incidence by 254%   BMI Readings from Last 4 Encounters:  10/26/15 24.51 kg/m  08/10/15 25.06 kg/m  09/15/14 24.37 kg/m  09/01/14 24.03 kg/m   Wt Readings from Last 4 Encounters:  10/26/15 142 lb 12.8 oz (64.8 kg)  08/10/15 146 lb (66.2 kg)  09/15/14 142 lb (64.4 kg)  09/01/14 140 lb (63.5 kg)  Psych/Mental status: Alert, oriented x 3 (person, place, & time)       Eyes: PERLA Respiratory: No evidence of acute respiratory distress  Cervical Spine Exam  Inspection: No masses, redness, or swelling Alignment: Symmetrical Functional ROM: Unrestricted ROM      Stability: No instability detected Muscle strength & Tone: Functionally intact Sensory: Unimpaired Palpation: No palpable anomalies              Upper Extremity (UE) Exam    Side: Right upper extremity  Side: Left upper extremity  Inspection: No masses, redness, swelling, or asymmetry. No contractures  Inspection: No masses, redness, swelling, or asymmetry. No contractures  Functional ROM: Unrestricted ROM          Functional ROM: Unrestricted ROM          Muscle strength & Tone: Functionally intact  Muscle strength & Tone: Functionally intact  Sensory: Unimpaired  Sensory: Unimpaired  Palpation: No palpable anomalies              Palpation: No palpable anomalies              Specialized Test(s): Deferred         Specialized Test(s): Deferred          Thoracic Spine Exam  Inspection: No masses, redness, or  swelling Alignment: Symmetrical Functional ROM: Unrestricted ROM Stability: No instability detected Sensory: Unimpaired Muscle strength & Tone: No palpable anomalies  Lumbar Spine Exam  Inspection: No masses, redness, or swelling Alignment: Symmetrical Functional ROM: Unrestricted ROM      Stability: No instability detected Muscle strength & Tone: Functionally intact Sensory: Unimpaired Palpation: No palpable anomalies         Provocative Tests: Lumbar Hyperextension and rotation test: evaluation deferred today       Patrick's Maneuver: evaluation deferred today                    Gait & Posture Assessment  Ambulation: Unassisted Gait: Relatively normal for age and body habitus Posture: WNL   Lower Extremity Exam    Side: Right lower extremity  Side: Left lower extremity  Inspection: No masses, redness, swelling, or asymmetry. No contractures  Inspection: No masses, redness, swelling, or asymmetry. No contractures  Functional ROM: Unrestricted ROM          Functional ROM: Unrestricted ROM          Muscle strength & Tone: Functionally intact  Muscle strength & Tone: Functionally intact  Sensory: Unimpaired  Sensory: Unimpaired  Palpation: No palpable anomalies  Palpation: No palpable anomalies   Assessment  Primary Diagnosis & Pertinent Problem List: The primary encounter diagnosis was Chronic pain syndrome. Diagnoses of Long-term use of high-risk medication, Opiate use, and Avitaminosis D were also pertinent to this visit.  Visit Diagnosis (New problems to examiner): 1. Chronic pain syndrome   2. Long-term use of high-risk medication   3. Opiate use   4. Avitaminosis D    Plan of Care (Initial workup plan)  Note: Please be advised that as per protocol, today's visit has been an evaluation only. We have not taken over the patient's controlled substance management.  Problem-specific plan: No problem-specific Assessment & Plan notes found for this encounter.  Ordered  Lab-work, Procedure(s), Referral(s), & Consult(s): No orders of the defined types were placed in this encounter.  Pharmacotherapy (current): Medications ordered:  No orders of the defined types were placed in this encounter.  Medications administered during this visit: Ms. Simic had no medications administered during this visit.   Pharmacological management options:  Opioid Analgesics: The patient was informed that there is no guarantee that she would be a candidate for opioid analgesics. The decision will be made following CDC guidelines. This decision will be based on the results of diagnostic studies, as well as Ms. Wickard's risk profile.   Membrane stabilizer: To be determined at a later time  Muscle relaxant: To be determined at a later time  NSAID: To be determined at a later time  Other analgesic(s): To be determined at a later time   Interventional management options: Ms. Asbridge was informed that there is no guarantee that she would be a candidate for interventional therapies. The decision will be based on the results of diagnostic studies, as well as Ms. Bertagnolli's risk profile.  Procedure(s) under consideration:  ***   Provider-requested follow-up: No Follow-up on file.  Future Appointments Date Time Provider Department Center  07/26/2016 11:15 AM , , MD [facility] None    Primary Care Physician: Burnette, Jennifer M, PA-C Location: [facility] Note by:  A. , M.D, DABA, DABAPM, DABPM, DABIPP, FIPP Date: 07/26/2016; Time: 5:55 PM  There are no Patient Instructions on file for this visit. 

## 2016-07-26 ENCOUNTER — Ambulatory Visit: Payer: Medicare Other | Attending: Pain Medicine | Admitting: Pain Medicine

## 2016-10-25 ENCOUNTER — Other Ambulatory Visit: Payer: Self-pay | Admitting: Family Medicine

## 2016-10-25 DIAGNOSIS — I10 Essential (primary) hypertension: Secondary | ICD-10-CM

## 2016-10-25 NOTE — Telephone Encounter (Signed)
Bob's patient. Last OV 10/26/15. Last RF 10/19/15 . Please review.

## 2016-10-26 NOTE — Telephone Encounter (Signed)
Would you refill this?

## 2016-10-26 NOTE — Telephone Encounter (Signed)
Appointment made for 10/28/2016 with Dr. Leonard Schwartz; pt advised that if she no-shows 3 times she will be dismissed. Has cancelled several appointments this year.

## 2016-10-26 NOTE — Telephone Encounter (Signed)
[facility] faxed refill request for 90 day supply of amLODipine (NORVASC) 5 MG tablet.       Gina [name] is out of the office this week and pt is going to see Dr. Leonard Schwartz on 10/28/16. Can medication be sent to pharmacy? Please advise. Thanks TNP

## 2016-10-28 ENCOUNTER — Encounter: Payer: Self-pay | Admitting: Family Medicine

## 2016-10-28 ENCOUNTER — Ambulatory Visit (INDEPENDENT_AMBULATORY_CARE_PROVIDER_SITE_OTHER): Payer: Medicare Other | Admitting: Family Medicine

## 2016-10-28 VITALS — BP 164/78 | HR 76 | Temp 97.5°F | Resp 16 | Wt 139.0 lb

## 2016-10-28 DIAGNOSIS — E7800 Pure hypercholesterolemia, unspecified: Secondary | ICD-10-CM

## 2016-10-28 DIAGNOSIS — E559 Vitamin D deficiency, unspecified: Secondary | ICD-10-CM | POA: Diagnosis not present

## 2016-10-28 DIAGNOSIS — H6121 Impacted cerumen, right ear: Secondary | ICD-10-CM | POA: Diagnosis not present

## 2016-10-28 DIAGNOSIS — I10 Essential (primary) hypertension: Secondary | ICD-10-CM | POA: Diagnosis not present

## 2016-10-28 NOTE — Assessment & Plan Note (Signed)
Recheck lipid panel after 10/29/16 after his been 1 year since last check Given history of CVA, would strongly consider statin in this patient for secondary prevention

## 2016-10-28 NOTE — Assessment & Plan Note (Signed)
Recheck vitamin D levels with labs next week.

## 2016-10-28 NOTE — Progress Notes (Signed)
Patient: Gina [name] Female    DOB: 13-Jul-1940   76 y.o.   MRN: [id number] Visit Date: 10/28/2016  Today's Provider: Shirlee Latch, MD   Chief Complaint  Patient presents with  . Hypertension   Subjective:    HPI      Hypertension, follow-up:  BP Readings from Last 3 Encounters:  10/28/16 (!) 164/78  02/05/16 (!) 158/75  10/26/15 (!) 144/86    She was last seen for hypertension over 1 year ago.  BP at that visit was 144/86. Management since that visit includes checking labs. She was unaware that her amlodipine was refilled 2 days ago. She is not having side effects.  She is not exercising. She is adherent to low salt diet.   Outside blood pressures are 140-145/70's. She is experiencing none.  Patient denies chest pain, chest pressure/discomfort, claudication, dyspnea, exertional chest pressure/discomfort, fatigue, irregular heart beat, lower extremity edema, near-syncope, orthopnea, palpitations and syncope.   Cardiovascular risk factors include advanced age (older than 59 for men, 33 for women) and hypertension.    Weight trend: increasing steadily Wt Readings from Last 3 Encounters:  10/28/16 139 lb (63 kg)  10/26/15 142 lb 12.8 oz (64.8 kg)  08/10/15 146 lb (66.2 kg)    Current diet: well balanced   ------------------------------------------------------------------------ R ear fullness: - feels like she is underwater when talking - ongoing for a few weeks - will pop occasionally which seems to open it up - no ear pain, fever - makes it difficult to sing in the choir in church  HLD: - not taking any medications - states her diet is "ok" - h/o CVA  Vitamin D deficiency: Patient was previously prescribed high-dose weekly vitamin D supplement for her vitamin D deficiency. She states that she never took this medication and she thinks it may be given her diarrhea which doesn't really remember. She's had no pathologic fractures. She is moving anyone  has ever gotten a bone density scan or even mentioned it to her.  Allergies  Allergen Reactions  . Hydrochlorothiazide   . Chlorthalidone Nausea Only  . Lisinopril Cough  . Penicillins Rash     Current Outpatient Prescriptions:  .  acetaminophen (TYLENOL) 325 MG tablet, Take by mouth every 6 (six) hours as needed for pain., Disp: , Rfl:  .  aspirin 325 MG tablet, Take 325 mg by mouth daily., Disp: , Rfl:  .  cetirizine (ZYRTEC) 10 MG tablet, Take 10 mg by mouth daily. Reported on 08/10/2015, Disp: , Rfl:  .  metoprolol (LOPRESSOR) 100 MG tablet, Take 100 mg by mouth 2 (two) times daily., Disp: , Rfl:  .  ranitidine (ZANTAC) 150 MG tablet, Take 150 mg by mouth daily., Disp: , Rfl:  .  amLODipine (NORVASC) 5 MG tablet, TAKE ONE TABLET BY MOUTH ONCE DAILY (Patient not taking: Reported on 10/28/2016), Disp: 90 tablet, Rfl: 3  Review of Systems  Constitutional: Negative for activity change, appetite change, chills, diaphoresis, fatigue, fever and unexpected weight change.  HENT: Negative for congestion, ear discharge, ear pain, postnasal drip, sinus pressure, sneezing and tinnitus.   Eyes: Negative.   Respiratory: Negative for shortness of breath.   Cardiovascular: Positive for chest pain. Negative for palpitations and leg swelling.  Musculoskeletal: Positive for arthralgias, gait problem and myalgias.  Neurological: Negative for dizziness and light-headedness.    Social History  Substance Use Topics  . Smoking status: Former Smoker    Packs/day: 0.50    Years:  5.00    Quit date: 01/23/1970  . Smokeless tobacco: Never Used  . Alcohol use No   Objective:   BP (!) 164/78 (BP Location: Left Arm, Patient Position: Sitting, Cuff Size: Normal)   Pulse 76   Temp (!) 97.5 F (36.4 C) (Oral)   Resp 16   Wt 139 lb (63 kg)   BMI 23.86 kg/m  Vitals:   10/28/16 0816  BP: (!) 164/78  Pulse: 76  Resp: 16  Temp: (!) 97.5 F (36.4 C)  TempSrc: Oral  Weight: 139 lb (63 kg)      Physical Exam  Constitutional: She is oriented to person, place, and time. She appears well-developed and well-nourished.  HENT:  Head: Normocephalic and atraumatic.  Right Ear: External ear normal.  Left Ear: Tympanic membrane, external ear and ear canal normal.  Nose: Nose normal.  Mouth/Throat: Oropharynx is clear and moist.  R ear cerumen impaction  Eyes: Pupils are equal, round, and reactive to light. Conjunctivae are normal. No scleral icterus.  Neck: Neck supple. No thyromegaly present.  Cardiovascular: Normal rate, regular rhythm, normal heart sounds and intact distal pulses.   No murmur heard. Pulmonary/Chest: Effort normal and breath sounds normal. No respiratory distress. She has no wheezes. She has no rales.  Abdominal: Soft. Bowel sounds are normal. She exhibits no distension. There is no tenderness. There is no rebound and no guarding.  Musculoskeletal: She exhibits no edema or deformity.  Lymphadenopathy:    She has no cervical adenopathy.  Neurological: She is alert and oriented to person, place, and time.  Skin: Skin is warm and dry. No rash noted.  Psychiatric: She has a normal mood and affect. Her behavior is normal.  Vitals reviewed.     Assessment & Plan:      Problem List Items Addressed This Visit      Cardiovascular and Mediastinum   Hypertension, essential - Primary    Uncontrolled Patient has not taken her amlodipine this morning, however We will continue current medications Recheck CMP Follow-up next week while taking medication and determine if amlodipine dosing to be increased      Relevant Orders   Lipid panel   COMPLETE METABOLIC PANEL WITH GFR   CBC w/Diff/Platelet     Other   Hypercholesteremia    Recheck lipid panel after 10/29/16 after his been 1 year since last check Given history of CVA, would strongly consider statin in this patient for secondary prevention      Relevant Orders   Lipid panel   COMPLETE METABOLIC PANEL WITH  GFR   Avitaminosis D    Recheck vitamin D levels with labs next week.      Relevant Orders   CBC w/Diff/Platelet   VITAMIN D 25 Hydroxy (Vit-D Deficiency, Fractures)    Other Visit Diagnoses    Impacted cerumen of right ear         - advised patient to try debrox OTC drops for cerumen and RTC for irrigation if necessary  Return in about 4 weeks (around 11/25/2016) for CPE. Recheck BP next week     The entirety of the information documented in the History of Present Illness, Review of Systems and Physical Exam were personally obtained by me. Portions of this information were initially documented by Irving Burton Ratchford, CMA and reviewed by me for thoroughness and accuracy.     Shirlee Latch, MD  [facility]

## 2016-10-28 NOTE — Assessment & Plan Note (Signed)
Uncontrolled Patient has not taken her amlodipine this morning, however We will continue current medications Recheck CMP Follow-up next week while taking medication and determine if amlodipine dosing to be increased

## 2016-10-28 NOTE — Patient Instructions (Addendum)
Try debrox drops to clean out your ear  Come back to have it cleaned out   Hypertension Hypertension, commonly called high blood pressure, is when the force of blood pumping through the arteries is too strong. The arteries are the blood vessels that carry blood from the heart throughout the body. Hypertension forces the heart to work harder to pump blood and may cause arteries to become narrow or stiff. Having untreated or uncontrolled hypertension can cause heart attacks, strokes, kidney disease, and other problems. A blood pressure reading consists of a higher number over a lower number. Ideally, your blood pressure should be below 120/80. The first ("top") number is called the systolic pressure. It is a measure of the pressure in your arteries as your heart beats. The second ("bottom") number is called the diastolic pressure. It is a measure of the pressure in your arteries as the heart relaxes. What are the causes? The cause of this condition is not known. What increases the risk? Some risk factors for high blood pressure are under your control. Others are not. Factors you can change  Smoking.  Having type 2 diabetes mellitus, high cholesterol, or both.  Not getting enough exercise or physical activity.  Being overweight.  Having too much fat, sugar, calories, or salt (sodium) in your diet.  Drinking too much alcohol. Factors that are difficult or impossible to change  Having chronic kidney disease.  Having a family history of high blood pressure.  Age. Risk increases with age.  Race. You may be at higher risk if you are African-American.  Gender. Men are at higher risk than women before age 66. After age 57, women are at higher risk than men.  Having obstructive sleep apnea.  Stress. What are the signs or symptoms? Extremely high blood pressure (hypertensive crisis) may cause:  Headache.  Anxiety.  Shortness of breath.  Nosebleed.  Nausea and  vomiting.  Severe chest pain.  Jerky movements you cannot control (seizures).  How is this diagnosed? This condition is diagnosed by measuring your blood pressure while you are seated, with your arm resting on a surface. The cuff of the blood pressure monitor will be placed directly against the skin of your upper arm at the level of your heart. It should be measured at least twice using the same arm. Certain conditions can cause a difference in blood pressure between your right and left arms. Certain factors can cause blood pressure readings to be lower or higher than normal (elevated) for a short period of time:  When your blood pressure is higher when you are in a health care provider's office than when you are at home, this is called white coat hypertension. Most people with this condition do not need medicines.  When your blood pressure is higher at home than when you are in a health care provider's office, this is called masked hypertension. Most people with this condition may need medicines to control blood pressure.  If you have a high blood pressure reading during one visit or you have normal blood pressure with other risk factors:  You may be asked to return on a different day to have your blood pressure checked again.  You may be asked to monitor your blood pressure at home for 1 week or longer.  If you are diagnosed with hypertension, you may have other blood or imaging tests to help your health care provider understand your overall risk for other conditions. How is this treated? This condition is treated  by making healthy lifestyle changes, such as eating healthy foods, exercising more, and reducing your alcohol intake. Your health care provider may prescribe medicine if lifestyle changes are not enough to get your blood pressure under control, and if:  Your systolic blood pressure is above 130.  Your diastolic blood pressure is above 80.  Your personal target blood pressure  may vary depending on your medical conditions, your age, and other factors. Follow these instructions at home: Eating and drinking  Eat a diet that is high in fiber and potassium, and low in sodium, added sugar, and fat. An example eating plan is called the DASH (Dietary Approaches to Stop Hypertension) diet. To eat this way: ? Eat plenty of fresh fruits and vegetables. Try to fill half of your plate at each meal with fruits and vegetables. ? Eat whole grains, such as whole wheat pasta, brown rice, or whole grain bread. Fill about one quarter of your plate with whole grains. ? Eat or drink low-fat dairy products, such as skim milk or low-fat yogurt. ? Avoid fatty cuts of meat, processed or cured meats, and poultry with skin. Fill about one quarter of your plate with lean proteins, such as fish, chicken without skin, beans, eggs, and tofu. ? Avoid premade and processed foods. These tend to be higher in sodium, added sugar, and fat.  Reduce your daily sodium intake. Most people with hypertension should eat less than 1,500 mg of sodium a day.  Limit alcohol intake to no more than 1 drink a day for nonpregnant women and 2 drinks a day for men. One drink equals 12 oz of beer, 5 oz of wine, or 1 oz of hard liquor. Lifestyle  Work with your health care provider to maintain a healthy body weight or to lose weight. Ask what an ideal weight is for you.  Get at least 30 minutes of exercise that causes your heart to beat faster (aerobic exercise) most days of the week. Activities may include walking, swimming, or biking.  Include exercise to strengthen your muscles (resistance exercise), such as pilates or lifting weights, as part of your weekly exercise routine. Try to do these types of exercises for 30 minutes at least 3 days a week.  Do not use any products that contain nicotine or tobacco, such as cigarettes and e-cigarettes. If you need help quitting, ask your health care provider.  Monitor your  blood pressure at home as told by your health care provider.  Keep all follow-up visits as told by your health care provider. This is important. Medicines  Take over-the-counter and prescription medicines only as told by your health care provider. Follow directions carefully. Blood pressure medicines must be taken as prescribed.  Do not skip doses of blood pressure medicine. Doing this puts you at risk for problems and can make the medicine less effective.  Ask your health care provider about side effects or reactions to medicines that you should watch for. Contact a health care provider if:  You think you are having a reaction to a medicine you are taking.  You have headaches that keep coming back (recurring).  You feel dizzy.  You have swelling in your ankles.  You have trouble with your vision. Get help right away if:  You develop a severe headache or confusion.  You have unusual weakness or numbness.  You feel faint.  You have severe pain in your chest or abdomen.  You vomit repeatedly.  You have trouble breathing. Summary  Hypertension  is when the force of blood pumping through your arteries is too strong. If this condition is not controlled, it may put you at risk for serious complications.  Your personal target blood pressure may vary depending on your medical conditions, your age, and other factors. For most people, a normal blood pressure is less than 120/80.  Hypertension is treated with lifestyle changes, medicines, or a combination of both. Lifestyle changes include weight loss, eating a healthy, low-sodium diet, exercising more, and limiting alcohol. This information is not intended to replace advice given to you by your health care provider. Make sure you discuss any questions you have with your health care provider. Document Released: 01/10/2005 Document Revised: 12/09/2015 Document Reviewed: 12/09/2015 Elsevier Interactive Patient Education  [employer].

## 2016-11-04 ENCOUNTER — Encounter: Payer: Self-pay | Admitting: Family Medicine

## 2016-11-04 ENCOUNTER — Ambulatory Visit (INDEPENDENT_AMBULATORY_CARE_PROVIDER_SITE_OTHER): Payer: Medicare Other | Admitting: Family Medicine

## 2016-11-04 VITALS — BP 162/76 | HR 72 | Temp 98.0°F | Resp 16 | Wt 140.0 lb

## 2016-11-04 DIAGNOSIS — H6121 Impacted cerumen, right ear: Secondary | ICD-10-CM | POA: Diagnosis not present

## 2016-11-04 DIAGNOSIS — E559 Vitamin D deficiency, unspecified: Secondary | ICD-10-CM | POA: Diagnosis not present

## 2016-11-04 DIAGNOSIS — I10 Essential (primary) hypertension: Secondary | ICD-10-CM | POA: Diagnosis not present

## 2016-11-04 DIAGNOSIS — Z8673 Personal history of transient ischemic attack (TIA), and cerebral infarction without residual deficits: Secondary | ICD-10-CM | POA: Diagnosis not present

## 2016-11-04 DIAGNOSIS — E7800 Pure hypercholesterolemia, unspecified: Secondary | ICD-10-CM

## 2016-11-04 MED ORDER — AMLODIPINE BESYLATE 10 MG PO TABS: 10.0000 mg | ORAL_TABLET | Freq: Every day | ORAL | 2 refills | Status: DC

## 2016-11-04 NOTE — Assessment & Plan Note (Signed)
Likely the cause of slight discomfort and decreased hearing of the right ear This is a recurrent problem for this patient Ear lavage performed today

## 2016-11-04 NOTE — Assessment & Plan Note (Signed)
Remains uncontrolled despite now taking her amlodipine regularly Increase amlodipine dose to 10 mg daily Continue metoprolol Recheck CMP as this was not done at last visit Follow-up in one month Advised home blood pressure monitoring in the interim Discussed low-salt diet and exercise

## 2016-11-04 NOTE — Patient Instructions (Signed)

## 2016-11-04 NOTE — Assessment & Plan Note (Signed)
Recheck lipid panel as it is been 1 year since last checked Given history of CVA, which strongly consider statin in this patient for secondary prevention

## 2016-11-04 NOTE — Progress Notes (Signed)
Patient: Gina [name] Female    DOB: [birth year]   76 y.o.   MRN: [id number] Visit Date: 11/04/2016  Today's Provider: Shirlee Latch, MD   Chief Complaint  Patient presents with  . Hypertension   Subjective:    HPI      Hypertension, follow-up:  BP Readings from Last 3 Encounters:  11/04/16 (!) 162/76  10/28/16 (!) 164/78  02/05/16 (!) 158/75    She was last seen for hypertension 1 weeks ago.  BP at that visit was 164/78. Management since that visit includes advising pt to take medication regularly and following up in 1 week, as pt was not taking this regularly. She reports good compliance with treatment. She is not having side effects.  She is not exercising due to leg pain. She is adherent to low salt diet.   Outside blood pressures have not been checked since LOV. She is experiencing none.  Patient denies chest pain, chest pressure/discomfort, claudication, dyspnea, exertional chest pressure/discomfort, fatigue, irregular heart beat, lower extremity edema, near-syncope, orthopnea, palpitations and syncope.   Cardiovascular risk factors include advanced age (older than 5 for men, 33 for women) and hypertension.    Weight trend: stable Wt Readings from Last 3 Encounters:  11/04/16 140 lb (63.5 kg)  10/28/16 139 lb (63 kg)  10/26/15 142 lb 12.8 oz (64.8 kg)    Current diet: in general, a "healthy" diet    ------------------------------------------------------------------------ Pt states she has used Debrox for cerumen impaction, with some relief. States she can "hiccup and still hear it pop". The right ear is affected by the cerumen.  She feels as though there is still wax in her ear.  HLD: Takes ASA  daily, h/o CVA, not taking statin, no myalgias,CP, SOB  Avitaminosis D: Low at last check, not taking supplement, does feel fatigued  Allergies  Allergen Reactions  . Hydrochlorothiazide   . Chlorthalidone Nausea Only  . Lisinopril Cough  .  Penicillins Rash     Current Outpatient Prescriptions:  .  acetaminophen (TYLENOL) 325 MG tablet, Take by mouth every 6 (six) hours as needed for pain., Disp: , Rfl:  .  amLODipine (NORVASC) 10 MG tablet, Take 1 tablet (10 mg total) by mouth daily., Disp: 90 tablet, Rfl: 2 .  aspirin 325 MG tablet, Take 325 mg by mouth daily., Disp: , Rfl:  .  cetirizine (ZYRTEC) 10 MG tablet, Take 10 mg by mouth daily. Reported on 08/10/2015, Disp: , Rfl:  .  metoprolol (LOPRESSOR) 100 MG tablet, Take 100 mg by mouth 2 (two) times daily., Disp: , Rfl:  .  ranitidine (ZANTAC) 150 MG tablet, Take 150 mg by mouth daily., Disp: , Rfl:   Review of Systems  Constitutional: Negative for activity change, appetite change, chills, diaphoresis, fatigue, fever and unexpected weight change.  HENT: Positive for ear pain. Negative for congestion, dental problem, ear discharge, sinus pain and sinus pressure.   Eyes: Negative.   Respiratory: Negative.  Negative for shortness of breath.   Cardiovascular: Negative for chest pain, palpitations and leg swelling.  Gastrointestinal: Negative.   Genitourinary: Negative.   Neurological: Negative.   Psychiatric/Behavioral: Negative.     Social History  Substance Use Topics  . Smoking status: Former Smoker    Packs/day: 0.50    Years: 5.00    Quit date: 01/23/1970  . Smokeless tobacco: Never Used  . Alcohol use No   Objective:   BP (!) 162/76 (BP Location: Left Arm, Patient Position:  Sitting, Cuff Size: Normal)   Pulse 72   Temp 98 F (36.7 C) (Oral)   Resp 16   Wt 140 lb (63.5 kg)   BMI 24.03 kg/m  Vitals:   11/04/16 1606  BP: (!) 162/76  Pulse: 72  Resp: 16  Temp: 98 F (36.7 C)  TempSrc: Oral  Weight: 140 lb (63.5 kg)     Physical Exam  Constitutional: She is oriented to person, place, and time. She appears well-developed and well-nourished. No distress.  HENT:  Head: Normocephalic and atraumatic.  Right Ear: External ear normal. No tenderness. No  foreign bodies.  Left Ear: Tympanic membrane, external ear and ear canal normal.  Nose: Nose normal.  Mouth/Throat: Oropharynx is clear and moist.  R ear with cerumen impaction  Eyes: Pupils are equal, round, and reactive to light. Conjunctivae are normal. No scleral icterus.  Neck: Neck supple. No thyromegaly present.  Cardiovascular: Normal rate, regular rhythm, normal heart sounds and intact distal pulses.   No murmur heard. Pulmonary/Chest: Effort normal and breath sounds normal. No respiratory distress. She has no wheezes. She has no rales.  Abdominal: Soft. She exhibits no distension. There is no tenderness.  Musculoskeletal: She exhibits no edema or deformity.  Lymphadenopathy:    She has no cervical adenopathy.  Neurological: She is alert and oriented to person, place, and time.  Skin: Skin is warm and dry. No rash noted.  Psychiatric: She has a normal mood and affect. Her behavior is normal.  Vitals reviewed.       Assessment & Plan:      Problem List Items Addressed This Visit      Cardiovascular and Mediastinum   Hypertension, essential - Primary    Remains uncontrolled despite now taking her amlodipine regularly Increase amlodipine dose to 10 mg daily Continue metoprolol Recheck CMP as this was not done at last visit Follow-up in one month Advised home blood pressure monitoring in the interim Discussed low-salt diet and exercise      Relevant Medications   amLODipine (NORVASC) 10 MG tablet   Other Relevant Orders   COMPLETE METABOLIC PANEL WITH GFR   CBC w/Diff/Platelet     Nervous and Auditory   Impacted cerumen of right ear    Likely the cause of slight discomfort and decreased hearing of the right ear This is a recurrent problem for this patient Ear lavage performed today        Other   History of CVA (cerebrovascular accident)    Continue aspirin As above, will recheck lipid panel and strongly consider statin for secondary prevention       Hypercholesteremia    Recheck lipid panel as it is been 1 year since last checked Given history of CVA, which strongly consider statin in this patient for secondary prevention      Relevant Medications   amLODipine (NORVASC) 10 MG tablet   Other Relevant Orders   COMPLETE METABOLIC PANEL WITH GFR   CBC w/Diff/Platelet   Lipid panel   Avitaminosis D    Recheck vitamin D level      Relevant Orders   CBC w/Diff/Platelet   VITAMIN D 25 Hydroxy (Vit-D Deficiency, Fractures)    Other Visit Diagnoses    Benign hypertension       Relevant Medications   amLODipine (NORVASC) 10 MG tablet      Return in about 4 weeks (around 12/02/2016) for HTN f.u.       The entirety of the information documented in  the History of Present Illness, Review of Systems and Physical Exam were personally obtained by me. Portions of this information were initially documented by Irving Burton Ratchford, CMA and reviewed by me for thoroughness and accuracy.     Shirlee Latch, MD  [facility]

## 2016-11-04 NOTE — Assessment & Plan Note (Signed)
Continue aspirin As above, will recheck lipid panel and strongly consider statin for secondary prevention

## 2016-11-04 NOTE — Assessment & Plan Note (Signed)
Recheck vitamin D level 

## 2016-11-05 LAB — CBC WITH DIFFERENTIAL/PLATELET
Basophils Absolute: 68 cells/uL (ref 0–200)
Basophils Relative: 0.7 %
Eosinophils Absolute: 252 cells/uL (ref 15–500)
Eosinophils Relative: 2.6 %
HEMATOCRIT: 42 % (ref 35.0–45.0)
HEMOGLOBIN: 14.4 g/dL (ref 11.7–15.5)
Lymphs Abs: 1824 cells/uL (ref 850–3900)
MCH: 30.4 pg (ref 27.0–33.0)
MCHC: 34.3 g/dL (ref 32.0–36.0)
MCV: 88.8 fL (ref 80.0–100.0)
MPV: 12.5 fL (ref 7.5–12.5)
Monocytes Relative: 9.9 %
NEUTROS PCT: 68 %
Neutro Abs: 6596 cells/uL (ref 1500–7800)
Platelets: 198 10*3/uL (ref 140–400)
RBC: 4.73 10*6/uL (ref 3.80–5.10)
RDW: 12.2 % (ref 11.0–15.0)
Total Lymphocyte: 18.8 %
WBC: 9.7 10*3/uL (ref 3.8–10.8)
WBCMIX: 960 {cells}/uL — AB (ref 200–950)

## 2016-11-05 LAB — COMPLETE METABOLIC PANEL WITH GFR
AG RATIO: 1.1 (calc) (ref 1.0–2.5)
ALT: 20 U/L (ref 6–29)
AST: 20 U/L (ref 10–35)
Albumin: 4.1 g/dL (ref 3.6–5.1)
Alkaline phosphatase (APISO): 80 U/L (ref 33–130)
BUN/Creatinine Ratio: 25 (calc) — ABNORMAL HIGH (ref 6–22)
BUN: 27 mg/dL — AB (ref 7–25)
CALCIUM: 9.8 mg/dL (ref 8.6–10.4)
CHLORIDE: 104 mmol/L (ref 98–110)
CO2: 26 mmol/L (ref 20–32)
CREATININE: 1.07 mg/dL — AB (ref 0.60–0.93)
GFR, Est African American: 58 mL/min/{1.73_m2} — ABNORMAL LOW (ref >=60)
GFR, Est Non African American: 50 mL/min/{1.73_m2} — ABNORMAL LOW (ref >=60)
Globulin: 3.7 g/dL (calc) (ref 1.9–3.7)
Glucose, Bld: 111 mg/dL — ABNORMAL HIGH (ref 65–99)
Potassium: 4.5 mmol/L (ref 3.5–5.3)
SODIUM: 140 mmol/L (ref 135–146)
TOTAL PROTEIN: 7.8 g/dL (ref 6.1–8.1)
Total Bilirubin: 0.3 mg/dL (ref 0.2–1.2)

## 2016-11-05 LAB — VITAMIN D 25 HYDROXY (VIT D DEFICIENCY, FRACTURES): Vit D, 25-Hydroxy: 14 ng/mL — ABNORMAL LOW (ref 30–100)

## 2016-11-05 LAB — LIPID PANEL
Cholesterol: 199 mg/dL (ref <200)
HDL: 37 mg/dL — AB (ref >50)
LDL Cholesterol (Calc): 116 mg/dL (calc) — ABNORMAL HIGH
NON-HDL CHOLESTEROL (CALC): 162 mg/dL — AB (ref <130)
Total CHOL/HDL Ratio: 5.4 (calc) — ABNORMAL HIGH (ref <5.0)
Triglycerides: 324 mg/dL — ABNORMAL HIGH (ref <150)

## 2016-11-07 ENCOUNTER — Telehealth: Payer: Self-pay

## 2016-11-07 ENCOUNTER — Encounter: Payer: Self-pay | Admitting: Family Medicine

## 2016-11-07 ENCOUNTER — Other Ambulatory Visit: Payer: Self-pay | Admitting: Family Medicine

## 2016-11-07 DIAGNOSIS — N183 Chronic kidney disease, stage 3 unspecified: Secondary | ICD-10-CM | POA: Insufficient documentation

## 2016-11-07 MED ORDER — VITAMIN D (ERGOCALCIFEROL) 1.25 MG (50000 UNIT) PO CAPS: 50000.0000 [IU] | ORAL_CAPSULE | ORAL | 0 refills | Status: DC

## 2016-11-07 NOTE — Telephone Encounter (Signed)
lmtcb

## 2016-11-07 NOTE — Telephone Encounter (Signed)
-----   Message from Erasmo Downer, MD sent at 11/07/2016  8:27 AM EDT ----- Normal liver function, electrolytes.  Kidney function is stable.  Normal Blood counts.  Cholesterol is elevated.  Given history of stroke, I would recommend lowering cholesterol with a statin medication.  This will also lower risk of future heart attack/stroke.  If patient agreeable, would Rx Atorvastatin  daily #90 r3.  Can recheck cholesterol in ~2 months - Can we schedule f/u appt for this or move CPE out this far so we can recheck?  Vit D level also low.  Recommend weekly high dose vitamin D supplement once weekly for 12 weeks (Rx sent to pharmacy).  After completing this course, start daily OTC supplement.  Erasmo Downer, MD, MPH [facility] 11/07/2016 8:27 AM

## 2016-11-11 NOTE — Telephone Encounter (Signed)
Left message to call back  

## 2016-11-11 NOTE — Telephone Encounter (Signed)
Pt is returning call. ZO#[phone]/[id number]#[phone]/MW

## 2016-11-16 ENCOUNTER — Encounter: Payer: Self-pay | Admitting: Family Medicine

## 2016-11-22 NOTE — Telephone Encounter (Signed)
lmtcb

## 2016-11-23 NOTE — Telephone Encounter (Signed)
Mailed letter asking pt to call office to discuss starting medication.

## 2016-11-25 ENCOUNTER — Telehealth: Payer: Self-pay

## 2016-11-25 MED ORDER — ATORVASTATIN CALCIUM 40 MG PO TABS: 40.0000 mg | ORAL_TABLET | Freq: Every day | ORAL | 3 refills | Status: DC

## 2016-11-25 NOTE — Telephone Encounter (Signed)
Pt called back regarding lab results. She agrees to start atorvastatin, and 2 month FU made. She states she was on high dose vitamin D in the past, but she could not take the medication because it caused diarrhea. Please advise.

## 2016-11-28 NOTE — Telephone Encounter (Signed)
LMTCB

## 2016-11-28 NOTE — Telephone Encounter (Signed)
OK to just take daily OTC Vit D supplement then.  Erasmo DownerBacigalupo, Angela M, MD, MPH [facility] 11/28/2016 10:06 AM

## 2016-11-28 NOTE — Telephone Encounter (Signed)
Pt advised.

## 2017-01-16 ENCOUNTER — Ambulatory Visit (INDEPENDENT_AMBULATORY_CARE_PROVIDER_SITE_OTHER): Payer: Medicare Other | Admitting: Family Medicine

## 2017-01-16 ENCOUNTER — Encounter: Payer: Self-pay | Admitting: Family Medicine

## 2017-01-16 VITALS — BP 130/80 | HR 82 | Temp 99.1°F | Resp 16 | Wt 137.2 lb

## 2017-01-16 DIAGNOSIS — J019 Acute sinusitis, unspecified: Secondary | ICD-10-CM | POA: Diagnosis not present

## 2017-01-16 MED ORDER — DOXYCYCLINE HYCLATE 100 MG PO TABS: 100.0000 mg | ORAL_TABLET | Freq: Two times a day (BID) | ORAL | 0 refills | Status: DC

## 2017-01-16 NOTE — Patient Instructions (Signed)
Continue Coricidin and add Mucinex and Delsym for cough.

## 2017-01-16 NOTE — Progress Notes (Signed)
Subjective:     Patient ID: Gina [name], female   DOB: 05/13/1940, 76 y.o.   MRN: [id number] Chief Complaint  Patient presents with  . Cough    Patient comes in office today with complaints of cough and congestion for three weeks, patient states that cough is productive of clear phlegm. Patient denies shortness of breath and wheezing.Patient has tried otc [place] Hbp    HPI States she thought she was getting better from her cold sx on or about 12/21 when she developed increased greenish sinus congestion and increased cough. Accompanied by her son today.  Review of Systems     Objective:   Physical Exam  Constitutional: She appears well-developed and well-nourished. No distress.  Ears: T.M's intact without inflammation Sinuses: non-tender Throat: no tonsillar enlargement or exudate Neck: no cervical adenopathy Lungs: clear     Assessment:    1. Acute non-recurrent sinusitis, unspecified location - doxycycline (VIBRA-TABS) 100 MG tablet; Take 1 tablet (100 mg total) by mouth 2 (two) times daily.  Dispense: 20 tablet; Refill: 0    Plan:    Add Mucinex and Delsym as needed.

## 2017-01-27 ENCOUNTER — Ambulatory Visit (INDEPENDENT_AMBULATORY_CARE_PROVIDER_SITE_OTHER): Payer: Medicare Other | Admitting: Family Medicine

## 2017-01-27 ENCOUNTER — Encounter: Payer: Self-pay | Admitting: Family Medicine

## 2017-01-27 VITALS — BP 142/72 | HR 68 | Temp 97.8°F | Resp 20 | Wt 136.0 lb

## 2017-01-27 DIAGNOSIS — Z8673 Personal history of transient ischemic attack (TIA), and cerebral infarction without residual deficits: Secondary | ICD-10-CM | POA: Diagnosis not present

## 2017-01-27 DIAGNOSIS — I10 Essential (primary) hypertension: Secondary | ICD-10-CM

## 2017-01-27 DIAGNOSIS — G894 Chronic pain syndrome: Secondary | ICD-10-CM | POA: Diagnosis not present

## 2017-01-27 DIAGNOSIS — E7800 Pure hypercholesterolemia, unspecified: Secondary | ICD-10-CM | POA: Diagnosis not present

## 2017-01-27 MED ORDER — DULOXETINE HCL 20 MG PO CPEP: ORAL_CAPSULE | ORAL | 0 refills | Status: DC

## 2017-01-27 NOTE — Patient Instructions (Signed)
Cholesterol Cholesterol is a white, waxy, fat-like substance that is needed by the human body in small amounts. The liver makes all the cholesterol we need. Cholesterol is carried from the liver by the blood through the blood vessels. Deposits of cholesterol (plaques) may build up on blood vessel (artery) walls. Plaques make the arteries narrower and stiffer. Cholesterol plaques increase the risk for heart attack and stroke. You cannot feel your cholesterol level even if it is very high. The only way to know that it is high is to have a blood test. Once you know your cholesterol levels, you should keep a record of the test results. Work with your health care provider to keep your levels in the desired range. What do the results mean?  Total cholesterol is a rough measure of all the cholesterol in your blood.  LDL (low-density lipoprotein) is the "bad" cholesterol. This is the type that causes plaque to build up on the artery walls. You want this level to be low.  HDL (high-density lipoprotein) is the "good" cholesterol because it cleans the arteries and carries the LDL away. You want this level to be high.  Triglycerides are fat that the body can either burn for energy or store. High levels are closely linked to heart disease. What are the desired levels of cholesterol?  Total cholesterol below 200.  LDL below 100 for people who are at risk, below 70 for people at very high risk.  HDL above 40 is good. A level of 60 or higher is considered to be protective against heart disease.  Triglycerides below 150. How can I lower my cholesterol? Diet Follow your diet program as told by your health care provider.  Choose fish or white meat chicken and [place], roasted or baked. Limit fatty cuts of red meat, fried foods, and processed meats, such as sausage and lunch meats.  Eat lots of fresh fruits and vegetables.  Choose whole grains, beans, pasta, potatoes, and cereals.  Choose olive oil, corn  oil, or canola oil, and use only small amounts.  Avoid butter, mayonnaise, shortening, or palm kernel oils.  Avoid foods with trans fats.  Drink skim or nonfat milk and eat low-fat or nonfat yogurt and cheeses. Avoid whole milk, cream, ice cream, egg yolks, and full-fat cheeses.  Healthier desserts include angel food cake, ginger snaps, animal crackers, hard candy, popsicles, and low-fat or nonfat frozen yogurt. Avoid pastries, cakes, pies, and cookies.  Exercise  Follow your exercise program as told by your health care provider. A regular program: ? Helps to decrease LDL and raise HDL. ? Helps with weight control.  Do things that increase your activity level, such as gardening, walking, and taking the stairs.  Ask your health care provider about ways that you can be more active in your daily life.  Medicine  Take over-the-counter and prescription medicines only as told by your health care provider. ? Medicine may be prescribed by your health care provider to help lower cholesterol and decrease the risk for heart disease. This is usually done if diet and exercise have failed to bring down cholesterol levels. ? If you have several risk factors, you may need medicine even if your levels are normal.  This information is not intended to replace advice given to you by your health care provider. Make sure you discuss any questions you have with your health care provider. Document Released: 10/05/2000 Document Revised: 08/08/2015 Document Reviewed: 07/11/2015 Elsevier Interactive Patient Education  2018 [employer] Low-Sodium Eating  Plan Sodium, which is an element that makes up salt, helps you maintain a healthy balance of fluids in your body. Too much sodium can increase your blood pressure and cause fluid and waste to be held in your body. Your health care provider or dietitian may recommend following this plan if you have high blood pressure (hypertension), kidney disease, liver  disease, or heart failure. Eating less sodium can help lower your blood pressure, reduce swelling, and protect your heart, liver, and kidneys. What are tips for following this plan? General guidelines  Most people on this plan should limit their sodium intake to 1,500-2,000 mg (milligrams) of sodium each day. Reading food labels  The Nutrition Facts label lists the amount of sodium in one serving of the food. If you eat more than one serving, you must multiply the listed amount of sodium by the number of servings.  Choose foods with less than 140 mg of sodium per serving.  Avoid foods with 300 mg of sodium or more per serving. Shopping  Look for lower-sodium products, often labeled as "low-sodium" or "no salt added."  Always check the sodium content even if foods are labeled as "unsalted" or "no salt added".  Buy fresh foods. ? Avoid canned foods and premade or frozen meals. ? Avoid canned, cured, or processed meats  Buy breads that have less than 80 mg of sodium per slice. Cooking  Eat more home-cooked food and less restaurant, buffet, and fast food.  Avoid adding salt when cooking. Use salt-free seasonings or herbs instead of table salt or sea salt. Check with your health care provider or pharmacist before using salt substitutes.  Cook with plant-based oils, such as canola, sunflower, or olive oil. Meal planning  When eating at a restaurant, ask that your food be prepared with less salt or no salt, if possible.  Avoid foods that contain MSG (monosodium glutamate). MSG is sometimes added to [place] food, bouillon, and some canned foods. What foods are recommended? The items listed may not be a complete list. Talk with your dietitian about what dietary choices are best for you. Grains Low-sodium cereals, including oats, puffed wheat and rice, and shredded wheat. Low-sodium crackers. Unsalted rice. Unsalted pasta. Low-sodium bread. Whole-grain breads and whole-grain  pasta. Vegetables Fresh or frozen vegetables. "No salt added" canned vegetables. "No salt added" tomato sauce and paste. Low-sodium or reduced-sodium tomato and vegetable juice. Fruits Fresh, frozen, or canned fruit. Fruit juice. Meats and other protein foods Fresh or frozen (no salt added) meat, poultry, seafood, and fish. Low-sodium canned tuna and salmon. Unsalted nuts. Dried peas, beans, and lentils without added salt. Unsalted canned beans. Eggs. Unsalted nut butters. Dairy Milk. Soy milk. Cheese that is naturally low in sodium, such as ricotta cheese, fresh mozzarella, or Swiss cheese Low-sodium or reduced-sodium cheese. Cream cheese. Yogurt. Fats and oils Unsalted butter. Unsalted margarine with no trans fat. Vegetable oils such as canola or olive oils. Seasonings and other foods Fresh and dried herbs and spices. Salt-free seasonings. Low-sodium mustard and ketchup. Sodium-free salad dressing. Sodium-free light mayonnaise. Fresh or refrigerated horseradish. Lemon juice. Vinegar. Homemade, reduced-sodium, or low-sodium soups. Unsalted popcorn and pretzels. Low-salt or salt-free chips. What foods are not recommended? The items listed may not be a complete list. Talk with your dietitian about what dietary choices are best for you. Grains Instant hot cereals. Bread stuffing, pancake, and biscuit mixes. Croutons. Seasoned rice or pasta mixes. Noodle soup cups. Boxed or frozen macaroni and cheese. Regular salted crackers. Self-rising  flour. Vegetables Sauerkraut, pickled vegetables, and relishes. Olives. [place] fries. Onion rings. Regular canned vegetables (not low-sodium or reduced-sodium). Regular canned tomato sauce and paste (not low-sodium or reduced-sodium). Regular tomato and vegetable juice (not low-sodium or reduced-sodium). Frozen vegetables in sauces. Meats and other protein foods Meat or fish that is salted, canned, smoked, spiced, or pickled. Bacon, ham, sausage, hotdogs, corned  beef, chipped beef, packaged lunch meats, salt pork, jerky, pickled herring, anchovies, regular canned tuna, sardines, salted nuts. Dairy Processed cheese and cheese spreads. Cheese curds. Blue cheese. Feta cheese. String cheese. Regular cottage cheese. Buttermilk. Canned milk. Fats and oils Salted butter. Regular margarine. Ghee. Bacon fat. Seasonings and other foods Onion salt, garlic salt, seasoned salt, table salt, and sea salt. Canned and packaged gravies. Worcestershire sauce. Tartar sauce. Barbecue sauce. Teriyaki sauce. Soy sauce, including reduced-sodium. Steak sauce. Fish sauce. Oyster sauce. Cocktail sauce. Horseradish that you find on the shelf. Regular ketchup and mustard. Meat flavorings and tenderizers. Bouillon cubes. Hot sauce and Tabasco sauce. Premade or packaged marinades. Premade or packaged taco seasonings. Relishes. Regular salad dressings. Salsa. Potato and tortilla chips. Corn chips and puffs. Salted popcorn and pretzels. Canned or dried soups. Pizza. Frozen entrees and pot pies. Summary  Eating less sodium can help lower your blood pressure, reduce swelling, and protect your heart, liver, and kidneys.  Most people on this plan should limit their sodium intake to 1,500-2,000 mg (milligrams) of sodium each day.  Canned, boxed, and frozen foods are high in sodium. Restaurant foods, fast foods, and pizza are also very high in sodium. You also get sodium by adding salt to food.  Try to cook at home, eat more fresh fruits and vegetables, and eat less fast food, canned, processed, or prepared foods. This information is not intended to replace advice given to you by your health care provider. Make sure you discuss any questions you have with your health care provider. Document Released: 07/02/2001 Document Revised: 01/04/2016 Document Reviewed: 01/04/2016 Elsevier Interactive Patient Education  [employer].

## 2017-01-27 NOTE — Progress Notes (Signed)
Patient: Gina [name] Female    DOB: 1940/02/06   77 y.o.   MRN: [id number] Visit Date: 01/27/2017  Today's Provider: Shirlee Latch, MD   I, Joslyn Hy, CMA, am acting as scribe for Shirlee Latch, MD.  Chief Complaint  Patient presents with  . Hypertension  . Hyperlipidemia   Subjective:    HPI      Hypertension, follow-up:  BP Readings from Last 3 Encounters:  01/27/17 (!) 142/72  01/16/17 130/80  11/04/16 (!) 162/76    She was last seen for hypertension 3 months ago.  BP at that visit was 162/76. Management since that visit includes increasing amlodipine to 10 mg. Also discussed low salt diet and exercise. She reports good compliance with treatment. She is not having side effects.  She is not exercising. She is adherent to low salt diet.   Outside blood pressures are 140 for SBP. Pt unsure of DBP. She is experiencing none.  Patient denies chest pain, chest pressure/discomfort, claudication, dyspnea, exertional chest pressure/discomfort, fatigue, irregular heart beat, lower extremity edema, near-syncope, orthopnea, palpitations and syncope.   Cardiovascular risk factors include advanced age (older than 10 for men, 67 for women), dyslipidemia and hypertension.    Weight trend: decreasing steadily Wt Readings from Last 3 Encounters:  01/27/17 136 lb (61.7 kg)  01/16/17 137 lb 3.2 oz (62.2 kg)  11/04/16 140 lb (63.5 kg)    Current diet: pt admits she eats more fast food then she should, especially [employer]  ------------------------------------------------------------------------  Lipid/Cholesterol, Follow-up:   Last seen for this 2 months ago.  Management changes since that visit include adding atorvastatin 40 mg qd. . Last Lipid Panel:    Component Value Date/Time   CHOL 199 11/04/2016 1628   CHOL 194 10/30/2015 1018   TRIG 324 (H) 11/04/2016 1628   HDL 37 (L) 11/04/2016 1628   HDL 40 10/30/2015 1018   CHOLHDL 5.4 (H) 11/04/2016 1628   LDLCALC 108 (H) 10/30/2015 1018    Risk factors for vascular disease include hypercholesterolemia and hypertension  She reports poor compliance with treatment. She has not started this medication because she does not want to. She is asking if there is any other way to lower cholesterol, such as diet, etc.  She is also c/o worsening left leg pain, which is radiating from her back. She is s/o total left hip replacement.  Has been followed by Ortho for this.  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 Several ESI didn't seem to help Has done PT previously too. Walks with cane  -------------------------------------------------------------------   Allergies  Allergen Reactions  . Hydrochlorothiazide   . Chlorthalidone Nausea Only  . Lisinopril Cough  . Penicillins Rash     Current Outpatient Medications:  .  acetaminophen (TYLENOL) 325 MG tablet, Take by mouth every 6 (six) hours as needed for pain., Disp: , Rfl:  .  amLODipine (NORVASC) 10 MG tablet, Take 1 tablet (10 mg total) by mouth daily., Disp: 90 tablet, Rfl: 2 .  aspirin 325 MG tablet, Take 325 mg by mouth daily., Disp: , Rfl:  .  cetirizine (ZYRTEC) 10 MG tablet, Take 10 mg by mouth daily. Reported on 08/10/2015, Disp: , Rfl:  .  cholecalciferol (VITAMIN D) 1000 units tablet, Take 1,000 Units by mouth daily., Disp: , Rfl:  .  metoprolol (LOPRESSOR) 100 MG tablet, Take 100 mg by mouth 2 (two) times daily., Disp: , Rfl:  .  ranitidine (ZANTAC) 150 MG  tablet, Take 150 mg by mouth daily., Disp: , Rfl:  .  atorvastatin (LIPITOR) 40 MG tablet, Take 1 tablet (40 mg total) by mouth daily. (Patient not taking: Reported on 01/16/2017), Disp: 90 tablet, Rfl: 3  Review of Systems  Constitutional: Negative.   HENT: Positive for hearing loss. Negative for congestion, ear discharge, ear pain, postnasal drip, rhinorrhea, trouble swallowing and voice change.   Respiratory: Negative.   Cardiovascular: Negative.   Gastrointestinal:  Negative.   Genitourinary: Negative.   Musculoskeletal: Positive for arthralgias, back pain, gait problem and myalgias.  Skin: Negative.   Neurological: Negative for speech difficulty, weakness and numbness.  Psychiatric/Behavioral: Negative.     Social History   Tobacco Use  . Smoking status: Former Smoker    Packs/day: 0.50    Years: 5.00    Pack years: 2.50    Last attempt to quit: 01/23/1970    Years since quitting: 47.0  . Smokeless tobacco: Never Used  Substance Use Topics  . Alcohol use: No   Objective:   BP (!) 142/72 (BP Location: Right Arm, Patient Position: Sitting, Cuff Size: Normal)   Pulse 68   Temp 97.8 F (36.6 C) (Oral)   Resp 20   Wt 136 lb (61.7 kg)   SpO2 99%   BMI 23.34 kg/m  Vitals:   01/27/17 1114  BP: (!) 142/72  Pulse: 68  Resp: 20  Temp: 97.8 F (36.6 C)  TempSrc: Oral  SpO2: 99%  Weight: 136 lb (61.7 kg)     Physical Exam  Constitutional: She is oriented to person, place, and time. She appears well-developed and well-nourished. No distress.  HENT:  Head: Normocephalic and atraumatic.  Nose: Nose normal.  Eyes: Conjunctivae are normal. Pupils are equal, round, and reactive to light. No scleral icterus.  Neck: Neck supple. No thyromegaly present.  Cardiovascular: Normal rate, regular rhythm, normal heart sounds and intact distal pulses.  No murmur heard. Pulmonary/Chest: Effort normal and breath sounds normal. No respiratory distress. She has no wheezes. She has no rales.  Abdominal: Soft. She exhibits no distension. There is no tenderness.  Musculoskeletal: She exhibits no edema or deformity.  Back: No midline or paraspinal TTP. Strength and sensation intact in lower extremities.  Negative SLR on R.  Unable to perform SLR on L leg due to inability to flex hip to 90 degrees  Lymphadenopathy:    She has no cervical adenopathy.  Neurological: She is alert and oriented to person, place, and time.  Skin: Skin is warm and dry. No rash  noted.  Psychiatric: She has a normal mood and affect. Her behavior is normal.  Vitals reviewed.       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 Has tried many orthopedic interventions without improvement Patient states she tried gabapentin and it made her feel crazy in the past ESI was not helpful for pain per patient Trial of  cymbalta - start with 20mg  daily and increase slowly to 40mg   F/u in 3 months and consider further dose titration      History of CVA (cerebrovascular accident)    Discussed importance of secondary prevention iwht good cholesterol and HTN control Patient continues to refuse medications however Continue aspirin Plans for HLD and HTN as above      Hypercholesteremia    LDL not at goal at last check (<70) Needs secondary prevention as she has had CVA Discussed diet and exercise Patient agrees to try lipitor to lower cholesterol Discussed potential side effects F/u in 3 months and repeat lipid panel to see if LDL improves          Return in about 3 months (around 04/27/2017) for HTN, HLD, pain f/u.      The entirety of the information documented in the History of Present Illness, Review of Systems and Physical Exam were personally obtained by me. Portions of this information were initially documented by Irving Burton Ratchford, CMA and reviewed by me for thoroughness and accuracy.     Erasmo Downer, MD, MPH [facility] 01/27/2017 12:34 PM

## 2017-01-27 NOTE — Assessment & Plan Note (Signed)
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

## 2017-01-27 NOTE — Assessment & Plan Note (Signed)
Discussed importance of secondary prevention iwht good cholesterol and HTN control Patient continues to refuse medications however Continue aspirin Plans for HLD and HTN as above

## 2017-01-27 NOTE — Assessment & Plan Note (Signed)
Above goal again today and home BP readings also elevated Tolerating full dose amlodipine well Reassured that amlodipine is not recalled Continue metoprolol and amlodipine Discussed low sodium diet and exercise (limited by chronic back/leg pain) Advised addition of another antihypertensive, but patient refuses Continue home monitoring F/u in 3 months and consider another agent again

## 2017-01-27 NOTE — Assessment & Plan Note (Signed)
LDL not at goal at last check (<70) Needs secondary prevention as she has had CVA Discussed diet and exercise Patient agrees to try lipitor to lower cholesterol Discussed potential side effects F/u in 3 months and repeat lipid panel to see if LDL improves

## 2017-02-05 IMAGING — CR DG HIP (WITH OR WITHOUT PELVIS) 2-3V*L*
1 series · 3 of 3 positions shown · non-contrast
Comparison: 07/14/2011

CLINICAL DATA: Left hip pain

EXAM:
DG HIP (WITH OR WITHOUT PELVIS) 2-3V LEFT

[Series 1: view not recorded · 0.14mm/px · 3 of 3 slices shown]
[im 1/3]
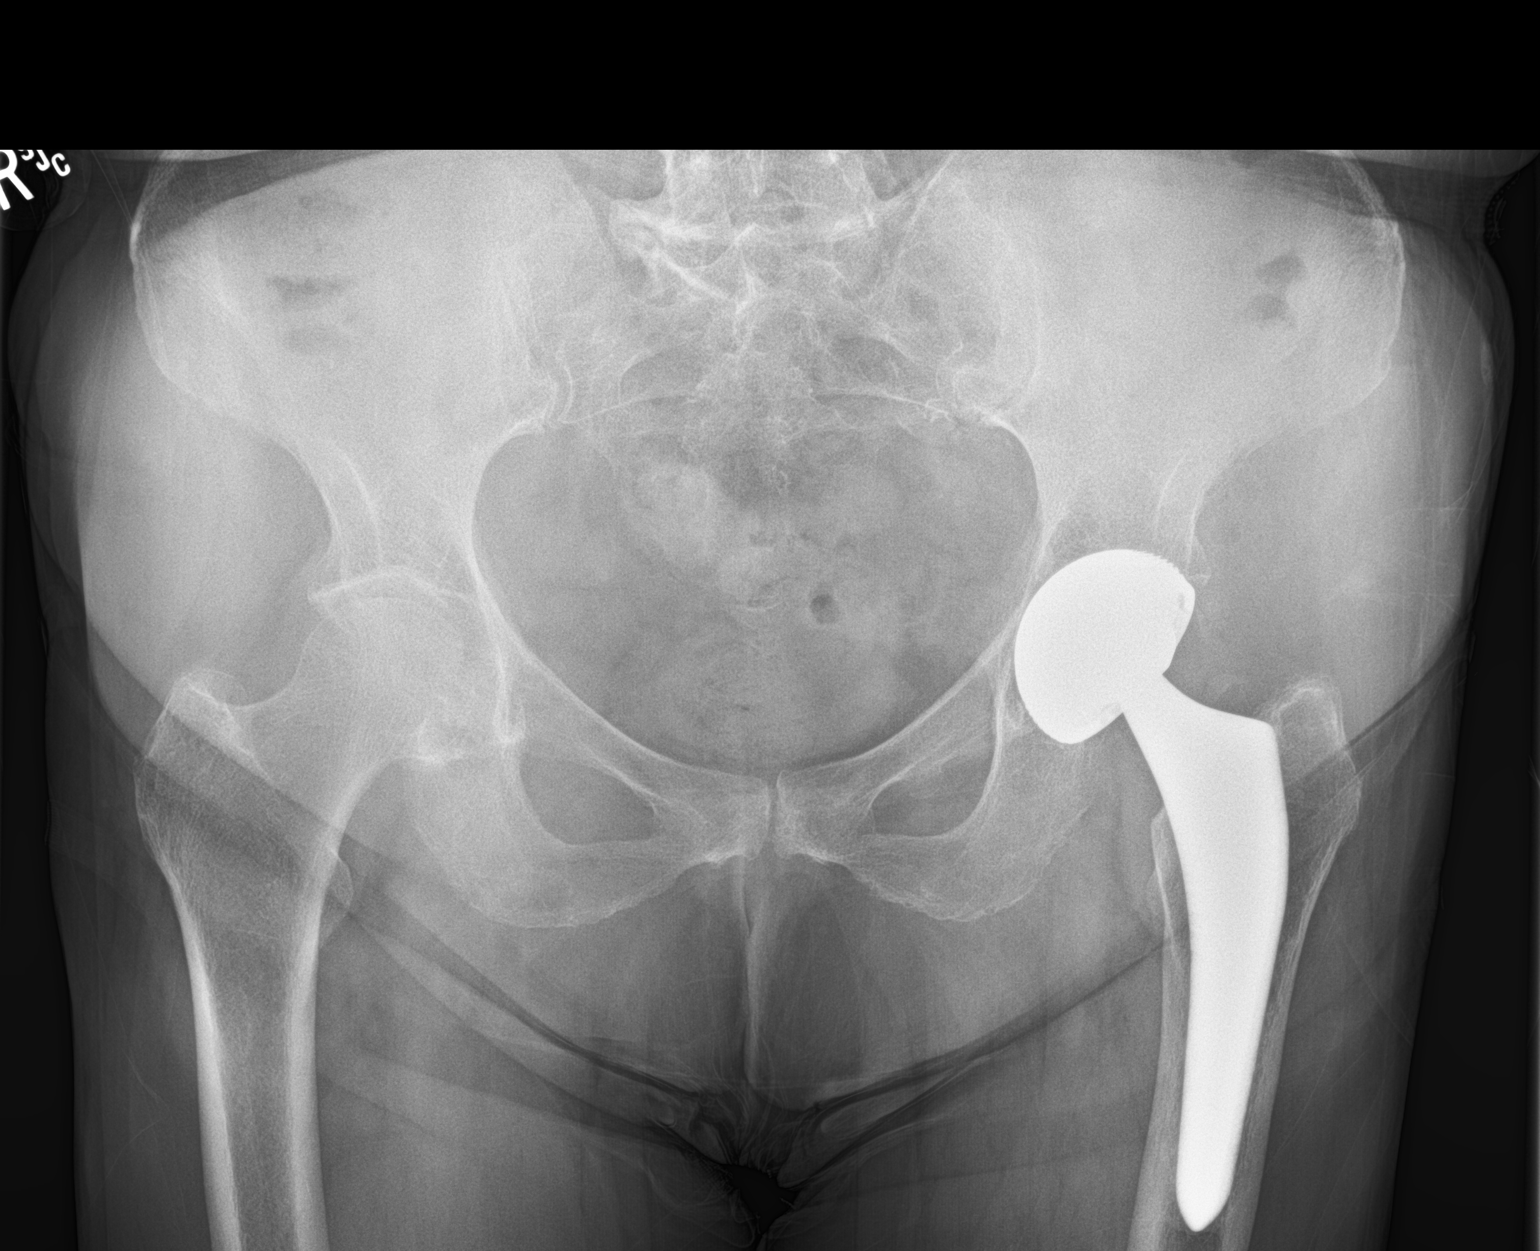
[im 2/3]
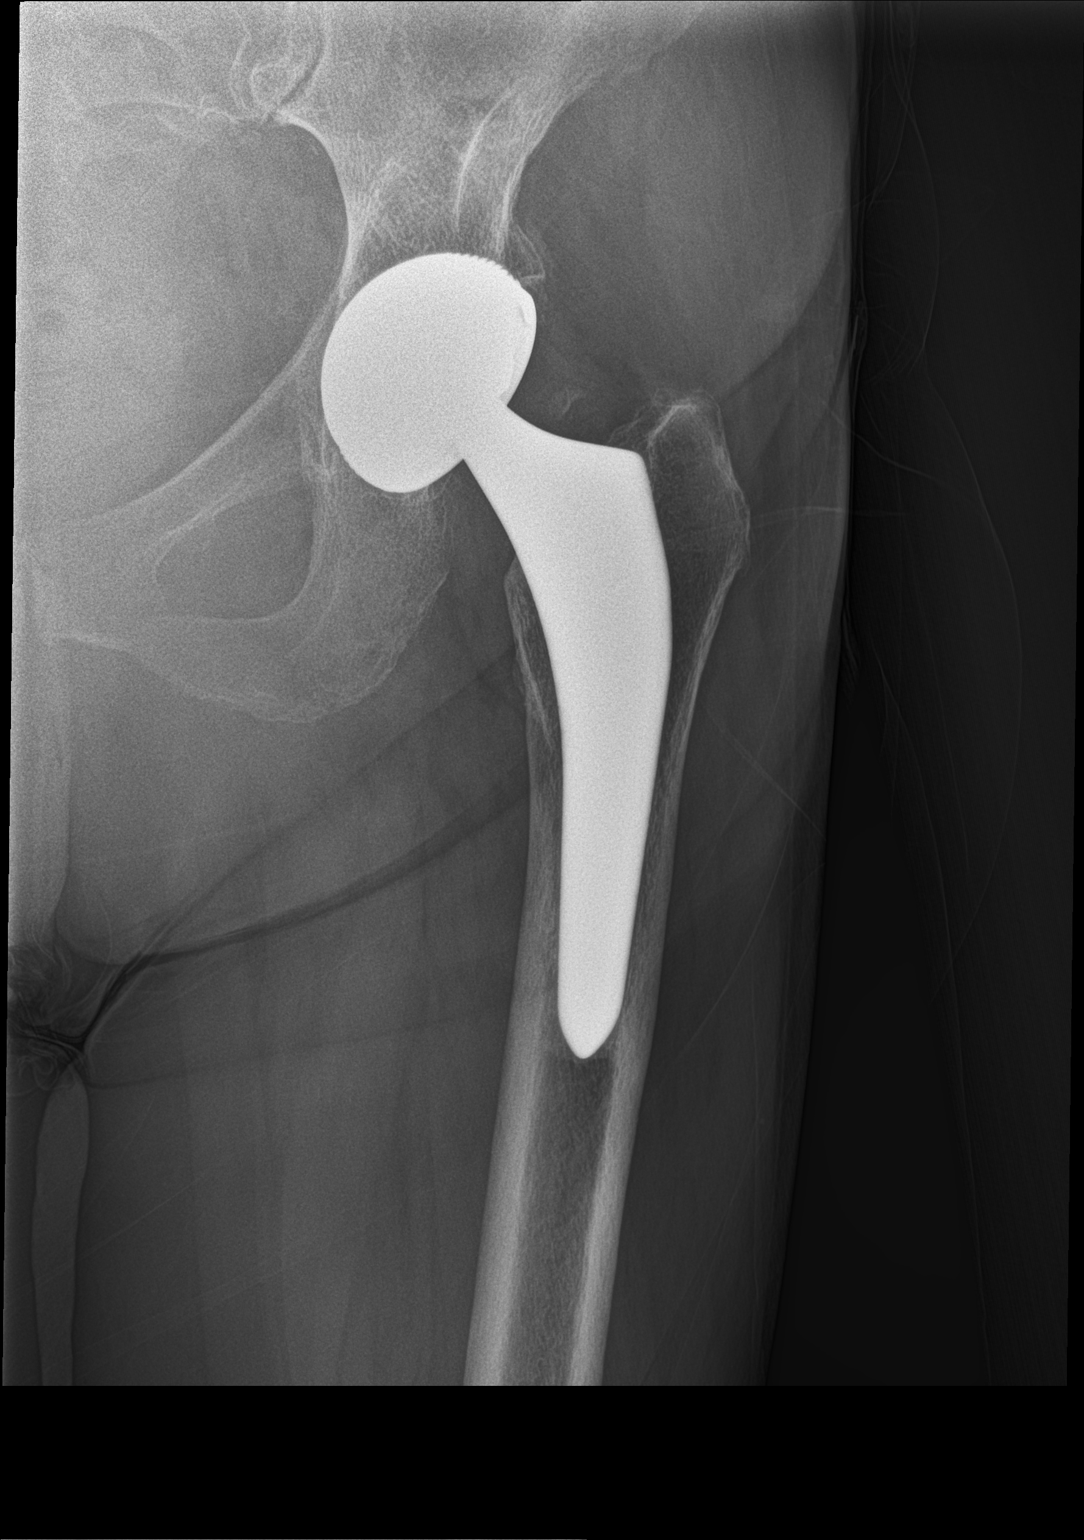
[im 3/3]
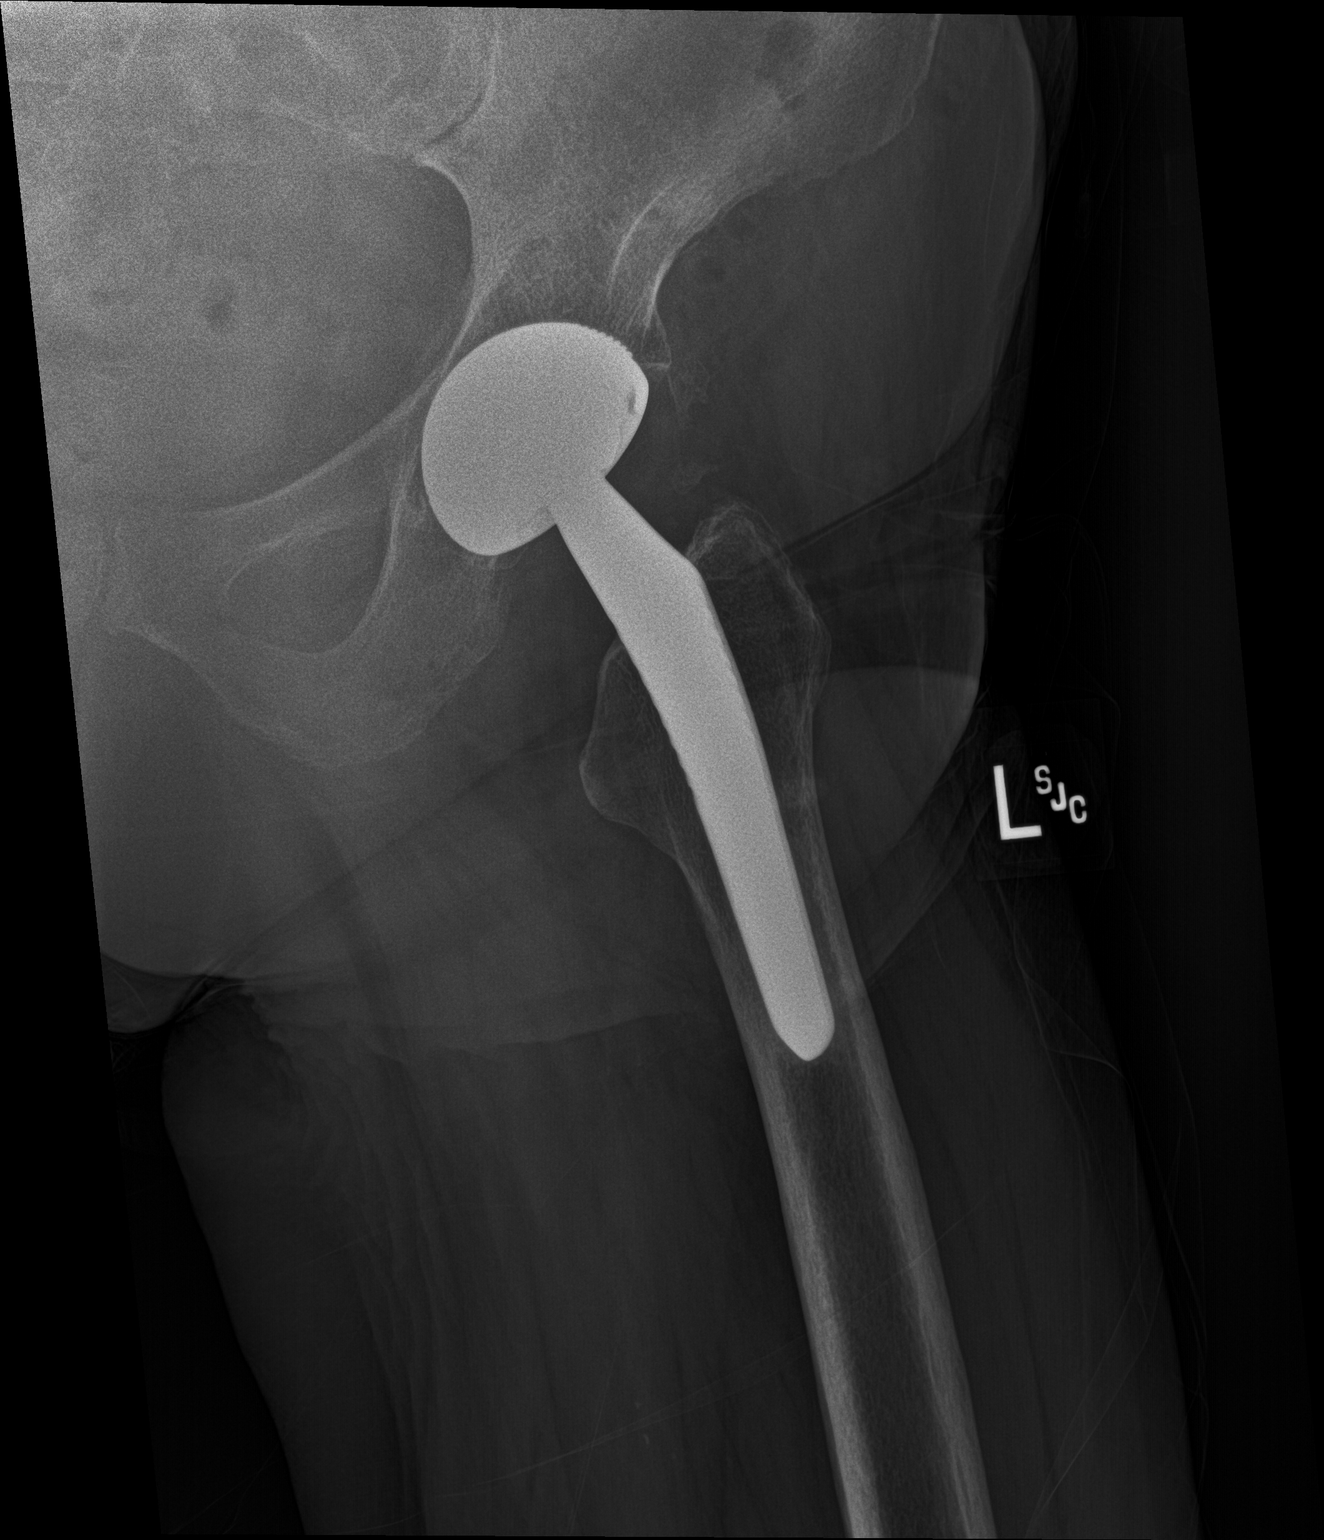

[3 of 3 positions shown; findings below may reference images not displayed]

FINDINGS: Left hip replacement in satisfactory position alignment and
unchanged from the prior study. No loosening or fracture. Mild
degenerative change in the right hip. No pelvic fracture.
IMPRESSION: Satisfactory left hip replacement.  No acute abnormality.

## 2017-02-05 IMAGING — CR DG LUMBAR SPINE 2-3V
1 series · 3 of 3 positions shown · non-contrast
Comparison: None.

CLINICAL DATA: Low back and left hip pain for 6 months without
known injury.

EXAM:
LUMBAR SPINE - 2-3 VIEW

[Series 1: dg lumbar spine 2-3 views · 0.14mm/px · 3 of 3 slices shown]
[im 1/3]
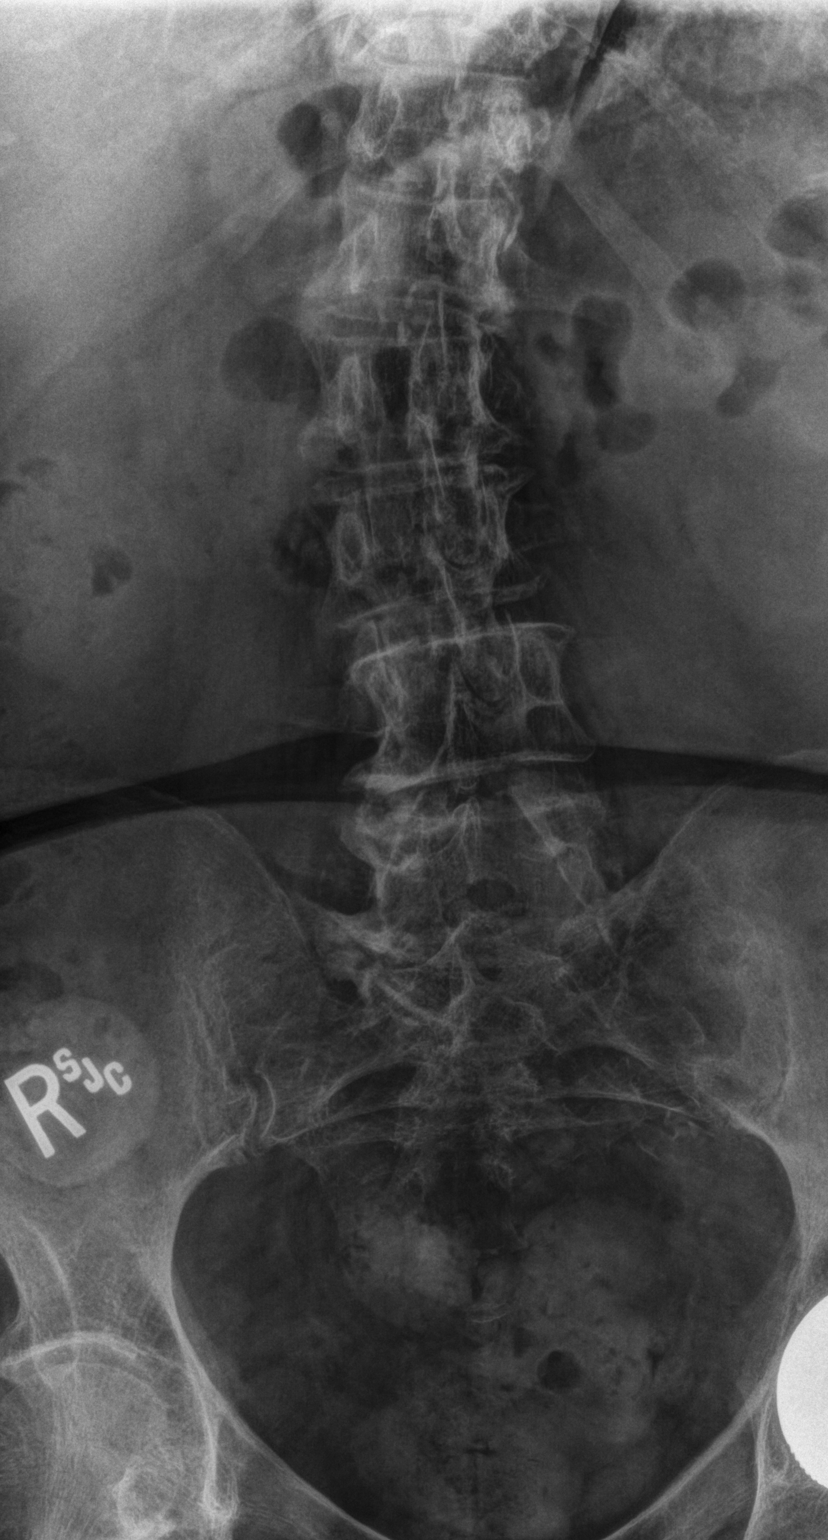
[im 2/3]
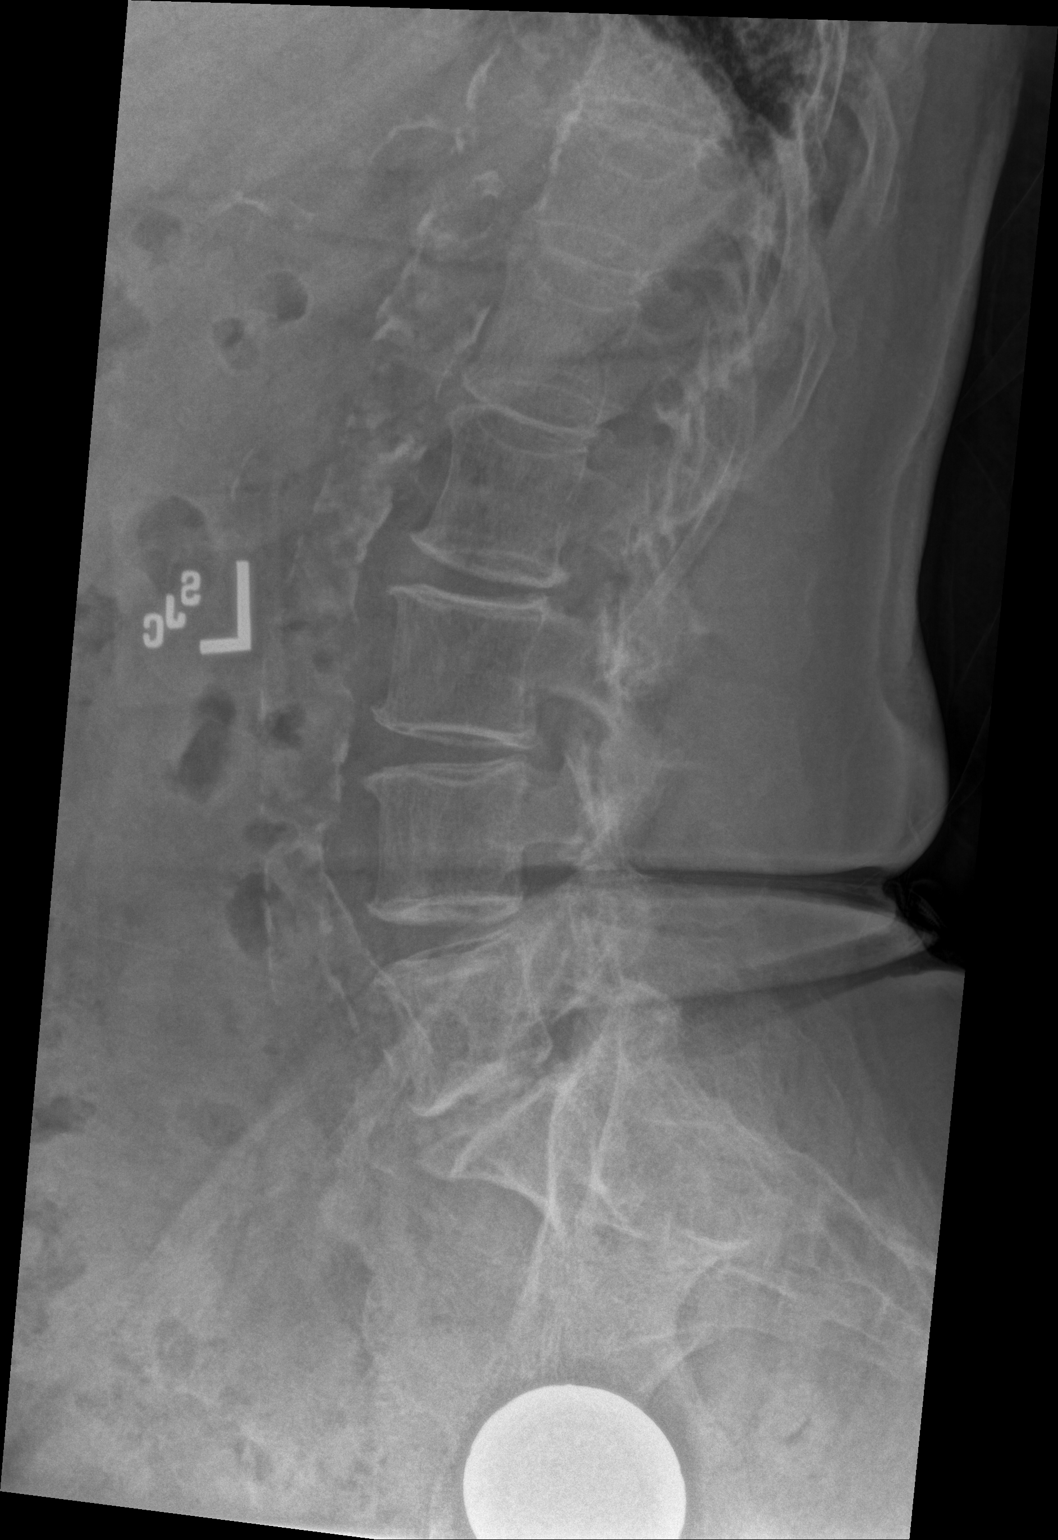
[im 3/3]
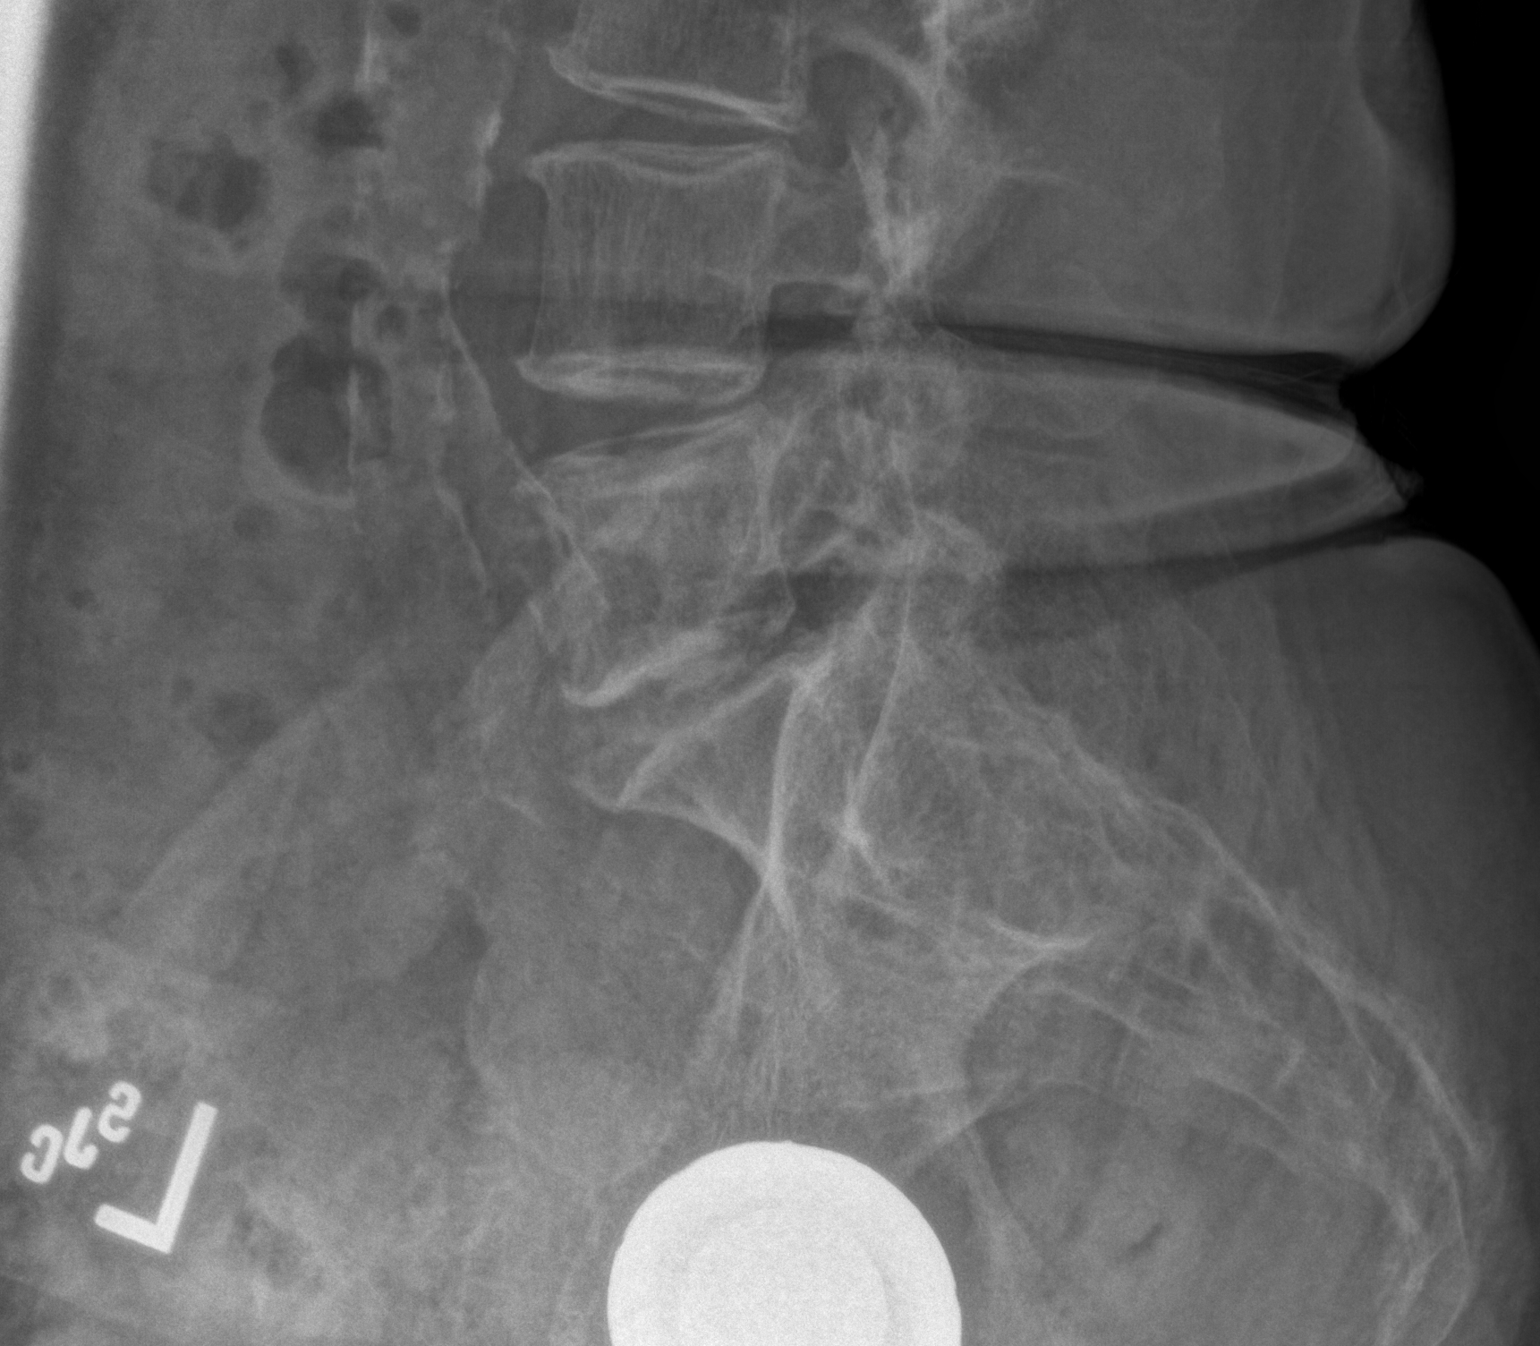

[3 of 3 positions shown; findings below may reference images not displayed]

FINDINGS: Moderate dextroscoliosis of upper lumbar spine is noted. Diffuse
osteopenia is noted. No fracture or spondylolisthesis is noted. Mild
degenerative disc disease is noted at L2-3, L3-4 and L5-S1.
Atherosclerosis of abdominal aorta is noted.
IMPRESSION: Multilevel degenerative disc disease is noted. No acute abnormality
seen in the lumbar spine.

## 2017-04-28 ENCOUNTER — Encounter: Payer: Self-pay | Admitting: Family Medicine

## 2017-04-28 ENCOUNTER — Ambulatory Visit (INDEPENDENT_AMBULATORY_CARE_PROVIDER_SITE_OTHER): Payer: Medicare Other | Admitting: Family Medicine

## 2017-04-28 VITALS — BP 142/74 | HR 70 | Temp 97.8°F | Resp 20 | Wt 139.0 lb

## 2017-04-28 DIAGNOSIS — R0600 Dyspnea, unspecified: Secondary | ICD-10-CM | POA: Insufficient documentation

## 2017-04-28 DIAGNOSIS — N3281 Overactive bladder: Secondary | ICD-10-CM | POA: Diagnosis not present

## 2017-04-28 DIAGNOSIS — E7800 Pure hypercholesterolemia, unspecified: Secondary | ICD-10-CM | POA: Diagnosis not present

## 2017-04-28 DIAGNOSIS — R0609 Other forms of dyspnea: Secondary | ICD-10-CM

## 2017-04-28 DIAGNOSIS — I10 Essential (primary) hypertension: Secondary | ICD-10-CM | POA: Diagnosis not present

## 2017-04-28 DIAGNOSIS — G894 Chronic pain syndrome: Secondary | ICD-10-CM | POA: Diagnosis not present

## 2017-04-28 MED ORDER — LOSARTAN POTASSIUM 50 MG PO TABS: 50.0000 mg | ORAL_TABLET | Freq: Every day | ORAL | 3 refills | Status: DC

## 2017-04-28 MED ORDER — OXYBUTYNIN CHLORIDE ER 5 MG PO TB24: 5.0000 mg | ORAL_TABLET | Freq: Every day | ORAL | 2 refills | Status: DC

## 2017-04-28 MED ORDER — AMLODIPINE BESYLATE 5 MG PO TABS: 5.0000 mg | ORAL_TABLET | Freq: Every day | ORAL | 2 refills | Status: DC

## 2017-04-28 NOTE — Assessment & Plan Note (Signed)
Concerning that the patient has valvular disease and she is having increasing dyspnea on exertion Given her history of CVA, she probably also has some CAD Referral back to Dr. Lady GaryFath, cardiology, for further evaluation

## 2017-04-28 NOTE — Patient Instructions (Signed)

## 2017-04-28 NOTE — Assessment & Plan Note (Signed)
Given history of CVA and LDL greater than 70, patient does need secondary prevention Advised her again of the importance of a statin to decrease her risk of future stroke or heart attack Discussed the potential side effects and also that these are not a guarantee She will try again to take Lipitor as prescribed Follow-up in 3 months

## 2017-04-28 NOTE — Assessment & Plan Note (Signed)
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

## 2017-04-28 NOTE — Progress Notes (Signed)
Patient: Gina ApleyMary E [name] Female    DOB: 04/06/1940   77 y.o.   MRN: [id number] Visit Date: 04/28/2017  Today's Provider: Shirlee LatchAngela Bacigalupo, MD   I, Joslyn HyEmily Ratchford, CMA, am acting as scribe for Shirlee LatchAngela Bacigalupo, MD.  Chief Complaint  Patient presents with  . Hypertension  . Hyperlipidemia  . Pain   Subjective:    HPI      Hypertension, follow-up:  BP Readings from Last 3 Encounters:  04/28/17 (!) 142/74  01/27/17 (!) 142/72  01/16/17 130/80    She was last seen for hypertension 3 months ago.  BP at that visit was 144/72. Management since that visit includes encouraging low sodium diet, as well as exercise since pt declined adding another antihypertensive medication. She reports fair compliance with treatment. She has difficulty exercising due to chronic pain. She states she is adherent to low salt diet.   Outside blood pressures are not being checked recently. She is experiencing dyspnea and lower extremity edema.  Patient denies chest pain, chest pressure/discomfort, exertional chest pressure/discomfort, fatigue, irregular heart beat, near-syncope, orthopnea, palpitations and syncope.   Cardiovascular risk factors include advanced age (older than 3155 for men, 1165 for women), dyslipidemia and hypertension.  Use of agents associated with hypertension: none.   Pt believes amlodipine is causing leg swelling.  Weight trend: increasing steadily Wt Readings from Last 3 Encounters:  04/28/17 139 lb (63 kg)  01/27/17 136 lb (61.7 kg)  01/16/17 137 lb 3.2 oz (62.2 kg)    Current diet: states she has cut back to 1 egg per day, is cooking vegetables at home. Also states she still eats fast food "sometimes".  ------------------------------------------------------------------------  Lipid/Cholesterol, Follow-up:   Last seen for this 3 months ago.  Management changes since that visit include encouraging pt to start Atorvastatin what was originally prescribed in  November. . Last Lipid Panel:    Component Value Date/Time   CHOL 199 11/04/2016 1628   CHOL 194 10/30/2015 1018   TRIG 324 (H) 11/04/2016 1628   HDL 37 (L) 11/04/2016 1628   HDL 40 10/30/2015 1018   CHOLHDL 5.4 (H) 11/04/2016 1628   LDLCALC 116 (H) 11/04/2016 1628    Risk factors for vascular disease include cerebral vascular disease, hypercholesterolemia and hypertension  She reports poor compliance with treatment. She is not taking this medication because she read the possible effects of myalgias. She states she is enough pain, and would rather treat cholesterol through diet.  -------------------------------------------------------------------  Follow up for Chronic Pain Syndrome  The patient was last seen for this 3 months ago. Changes made at last visit include adding Cymbalta 20 mg.  She reports poor compliance with treatment. She feels that condition is Unchanged. She is no taking this medication due to possible side effects (suicidal thoughts).  She has previously been seen by NSG and received ESIs without relief. ------------------------------------------------------------------------------------ Overactive bladder: States that she has struggled with nocturia, incontinence for years.  She wakes 3-4 times nightly to urinate.  She will leak urine when getting out of bed.  She often doesn't make it to the bathroom.  Dyspnea on exertion: Patient reports that for the last month she has had worsening dyspnea on exertion.  She dismisses this and thinks it is related to her difficulty walking from her leg pain.  Her son who is with her today states that she has had her leg pain for a long time and this is new.  He is concerned that she is  able to walk shorter and shorter distances without getting short of breath.  She denies any chest pain, dizziness, visual changes, nausea, vomiting, diaphoresis.  She was seen 2 years ago by Dr. Lady Gary who recommended 1 year  follow-up.   Allergies  Allergen Reactions  . Hydrochlorothiazide   . Chlorthalidone Nausea Only  . Lisinopril Cough  . Penicillins Rash     Current Outpatient Medications:  .  acetaminophen (TYLENOL) 325 MG tablet, Take by mouth every 6 (six) hours as needed for pain., Disp: , Rfl:  .  amLODipine (NORVASC) 10 MG tablet, Take 1 tablet (10 mg total) by mouth daily., Disp: 90 tablet, Rfl: 2 .  aspirin 325 MG tablet, Take 325 mg by mouth daily., Disp: , Rfl:  .  cetirizine (ZYRTEC) 10 MG tablet, Take 10 mg by mouth daily. Reported on 08/10/2015, Disp: , Rfl:  .  cholecalciferol (VITAMIN D) 1000 units tablet, Take 1,000 Units by mouth daily., Disp: , Rfl:  .  metoprolol (LOPRESSOR) 100 MG tablet, Take 100 mg by mouth 2 (two) times daily., Disp: , Rfl:  .  ranitidine (ZANTAC) 150 MG tablet, Take 150 mg by mouth daily., Disp: , Rfl:  .  atorvastatin (LIPITOR) 40 MG tablet, Take 1 tablet (40 mg total) by mouth daily. (Patient not taking: Reported on 01/16/2017), Disp: 90 tablet, Rfl: 3 .  DULoxetine (CYMBALTA) 20 MG capsule, Take 1 capsule (20 mg total) by mouth daily for 14 days, THEN 2 capsules (40 mg total) daily. (Patient not taking: No sig reported), Disp: 174 capsule, Rfl: 0  Review of Systems  Constitutional: Negative for activity change, appetite change, chills, diaphoresis, fatigue, fever and unexpected weight change.  Respiratory: Positive for shortness of breath (on exertion).   Cardiovascular: Positive for leg swelling. Negative for chest pain and palpitations.  Musculoskeletal: Positive for arthralgias, back pain, gait problem and myalgias.    Social History   Tobacco Use  . Smoking status: Former Smoker    Packs/day: 0.50    Years: 5.00    Pack years: 2.50    Last attempt to quit: 01/23/1970    Years since quitting: 47.2  . Smokeless tobacco: Never Used  Substance Use Topics  . Alcohol use: No   Objective:   BP (!) 142/74 (BP Location: Left Arm, Patient Position:  Sitting, Cuff Size: Normal)   Pulse 70   Temp 97.8 F (36.6 C) (Oral)   Resp 20   Wt 139 lb (63 kg)   SpO2 97%   BMI 23.86 kg/m  Vitals:   04/28/17 1111  BP: (!) 142/74  Pulse: 70  Resp: 20  Temp: 97.8 F (36.6 C)  TempSrc: Oral  SpO2: 97%  Weight: 139 lb (63 kg)     Physical Exam  Constitutional: She is oriented to person, place, and time. She appears well-developed and well-nourished. No distress.  HENT:  Head: Normocephalic and atraumatic.  Eyes: Conjunctivae are normal. No scleral icterus.  Cardiovascular: Normal rate, regular rhythm and intact distal pulses.  Pulmonary/Chest: Effort normal and breath sounds normal. No respiratory distress. She has no wheezes. She has no rales.  Abdominal: Soft. She exhibits no distension. There is no tenderness.  Musculoskeletal: She exhibits edema (trace).  Neurological: She is alert and oriented to person, place, and time.  Skin: Skin is warm and dry. No rash noted.  Psychiatric: She has a normal mood and affect. Her behavior is normal.  Vitals reviewed.      Assessment & Plan:  Problem List Items Addressed This Visit      Cardiovascular and Mediastinum   Hypertension, essential - Primary    Uncontrolled Having side effects of lower extremity edema from increased amlodipine dose Decrease amlodipine back to 5 mg daily which she was tolerating well Continue metoprolol at current dose Start losartan 50 mg daily Continue home monitoring of blood pressure Follow-up in 3 months and check labs at that time      Relevant Medications   losartan (COZAAR) 50 MG tablet   amLODipine (NORVASC) 5 MG tablet     Genitourinary   Overactive bladder    Seems to be a mixed picture with related bladder spasms, weak pelvic floor We will try Oxybutynin to decrease bladder spasms Could consider urology referral in the future Discussed possible side effects of oxybutynin        Other   Chronic pain syndrome (Chronic)    Related to  spinal stenosis/DJD Is tried many orthopedic interventions without improvement She states that gabapentin and ESI were not helpful Advised her again to try Cymbalta as prescribed at last visit Follow-up in 3 months and consider further dose titration Given her CKD 3, advised her to avoid chronic NSAIDs      Hypercholesteremia    Given history of CVA and LDL greater than 70, patient does need secondary prevention Advised her again of the importance of a statin to decrease her risk of future stroke or heart attack Discussed the potential side effects and also that these are not a guarantee She will try again to take Lipitor as prescribed Follow-up in 3 months      Relevant Medications   losartan (COZAAR) 50 MG tablet   amLODipine (NORVASC) 5 MG tablet   Dyspnea on exertion    Concerning that the patient has valvular disease and she is having increasing dyspnea on exertion Given her history of CVA, she probably also has some CAD Referral back to Dr. Lady Gary, cardiology, for further evaluation      Relevant Orders   Ambulatory referral to Cardiology    Other Visit Diagnoses    Benign hypertension       Relevant Medications   losartan (COZAAR) 50 MG tablet   amLODipine (NORVASC) 5 MG tablet       Return in about 3 months (around 07/28/2017) for chronic disease f/u.   The entirety of the information documented in the History of Present Illness, Review of Systems and Physical Exam were personally obtained by me. Portions of this information were initially documented by Irving Burton Ratchford, CMA and reviewed by me for thoroughness and accuracy.    Erasmo Downer, MD, MPH [facility] 04/28/2017 3:31 PM

## 2017-04-28 NOTE — Assessment & Plan Note (Signed)
Seems to be a mixed picture with related bladder spasms, weak pelvic floor We will try Oxybutynin to decrease bladder spasms Could consider urology referral in the future Discussed possible side effects of oxybutynin

## 2017-04-28 NOTE — Assessment & Plan Note (Signed)
Uncontrolled Having side effects of lower extremity edema from increased amlodipine dose Decrease amlodipine back to 5 mg daily which she was tolerating well Continue metoprolol at current dose Start losartan 50 mg daily Continue home monitoring of blood pressure Follow-up in 3 months and check labs at that time

## 2017-05-30 DIAGNOSIS — R002 Palpitations: Secondary | ICD-10-CM | POA: Diagnosis not present

## 2017-05-30 DIAGNOSIS — R0602 Shortness of breath: Secondary | ICD-10-CM | POA: Diagnosis not present

## 2017-05-30 DIAGNOSIS — I10 Essential (primary) hypertension: Secondary | ICD-10-CM | POA: Diagnosis not present

## 2017-06-02 ENCOUNTER — Ambulatory Visit (INDEPENDENT_AMBULATORY_CARE_PROVIDER_SITE_OTHER): Payer: Medicare Other

## 2017-06-02 ENCOUNTER — Ambulatory Visit (INDEPENDENT_AMBULATORY_CARE_PROVIDER_SITE_OTHER): Payer: Medicare Other | Admitting: Family Medicine

## 2017-06-02 ENCOUNTER — Encounter: Payer: Self-pay | Admitting: Family Medicine

## 2017-06-02 VITALS — BP 190/82 | HR 68 | Temp 98.7°F | Resp 16 | Ht 64.0 in | Wt 134.0 lb

## 2017-06-02 VITALS — BP 156/84 | HR 68 | Temp 98.7°F | Ht 64.0 in | Wt 134.0 lb

## 2017-06-02 DIAGNOSIS — Z0000 Encounter for general adult medical examination without abnormal findings: Secondary | ICD-10-CM

## 2017-06-02 DIAGNOSIS — N3281 Overactive bladder: Secondary | ICD-10-CM

## 2017-06-02 DIAGNOSIS — E7800 Pure hypercholesterolemia, unspecified: Secondary | ICD-10-CM | POA: Diagnosis not present

## 2017-06-02 DIAGNOSIS — G894 Chronic pain syndrome: Secondary | ICD-10-CM

## 2017-06-02 DIAGNOSIS — I10 Essential (primary) hypertension: Secondary | ICD-10-CM

## 2017-06-02 MED ORDER — OXYBUTYNIN CHLORIDE 5 MG PO TABS: 5.0000 mg | ORAL_TABLET | Freq: Two times a day (BID) | ORAL | 3 refills | Status: DC

## 2017-06-02 NOTE — Patient Instructions (Signed)
Preventive Care 26 Years and Older, Female Preventive care refers to lifestyle choices and visits with your health care provider that can promote health and wellness. What does preventive care include?  A yearly physical exam. This is also called an annual well check.  Dental exams once or twice a year.  Routine eye exams. Ask your health care provider how often you should have your eyes checked.  Personal lifestyle choices, including: ? Daily care of your teeth and gums. ? Regular physical activity. ? Eating a healthy diet. ? Avoiding tobacco and drug use. ? Limiting alcohol use. ? Practicing safe sex. ? Taking low-dose aspirin every day. ? Taking vitamin and mineral supplements as recommended by your health care provider. What happens during an annual well check? The services and screenings done by your health care provider during your annual well check will depend on your age, overall health, lifestyle risk factors, and family history of disease. Counseling Your health care provider may ask you questions about your:  Alcohol use.  Tobacco use.  Drug use.  Emotional well-being.  Home and relationship well-being.  Sexual activity.  Eating habits.  History of falls.  Memory and ability to understand (cognition).  Work and work Statistician.  Reproductive health.  Screening You may have the following tests or measurements:  Height, weight, and BMI.  Blood pressure.  Lipid and cholesterol levels. These may be checked every 5 years, or more frequently if you are over 40 years old.  Skin check.  Lung cancer screening. You may have this screening every year starting at age 8 if you have a 30-pack-year history of smoking and currently smoke or have quit within the past 15 years.  Fecal occult blood test (FOBT) of the stool. You may have this test every year starting at age 77.  Flexible sigmoidoscopy or colonoscopy. You may have a sigmoidoscopy every 5 years or  a colonoscopy every 10 years starting at age 22.  Hepatitis C blood test.  Hepatitis B blood test.  Sexually transmitted disease (STD) testing.  Diabetes screening. This is done by checking your blood sugar (glucose) after you have not eaten for a while (fasting). You may have this done every 1-3 years.  Bone density scan. This is done to screen for osteoporosis. You may have this done starting at age 77.  Mammogram. This may be done every 1-2 years. Talk to your health care provider about how often you should have regular mammograms.  Talk with your health care provider about your test results, treatment options, and if necessary, the need for more tests. Vaccines Your health care provider may recommend certain vaccines, such as:  Influenza vaccine. This is recommended every year.  Tetanus, diphtheria, and acellular pertussis (Tdap, Td) vaccine. You may need a Td booster every 10 years.  Varicella vaccine. You may need this if you have not been vaccinated.  Zoster vaccine. You may need this after age 77.  Measles, mumps, and rubella (MMR) vaccine. You may need at least one dose of MMR if you were born in 1957 or later. You may also need a second dose.  Pneumococcal 13-valent conjugate (PCV13) vaccine. One dose is recommended after age 77.  Pneumococcal polysaccharide (PPSV23) vaccine. One dose is recommended after age 77.  Meningococcal vaccine. You may need this if you have certain conditions.  Hepatitis A vaccine. You may need this if you have certain conditions or if you travel or work in places where you may be exposed to hepatitis  A.  Hepatitis B vaccine. You may need this if you have certain conditions or if you travel or work in places where you may be exposed to hepatitis B.  Haemophilus influenzae type b (Hib) vaccine. You may need this if you have certain conditions.  Talk to your health care provider about which screenings and vaccines you need and how often you  need them. This information is not intended to replace advice given to you by your health care provider. Make sure you discuss any questions you have with your health care provider. Document Released: 02/06/2015 Document Revised: 09/30/2015 Document Reviewed: 11/11/2014 Elsevier Interactive Patient Education  [employer].

## 2017-06-02 NOTE — Assessment & Plan Note (Signed)
Given h/o CVA and LDL >70, needs secondary prevention She has not started statin Advised again to start this F/u in 6 months

## 2017-06-02 NOTE — Patient Instructions (Signed)
Gina [name] , Thank you for taking time to come for your Medicare Wellness Visit. I appreciate your ongoing commitment to your health goals. Please review the following plan we discussed and let me know if I can assist you in the future.   Screening recommendations/referrals: Colonoscopy: N/A Mammogram: N/A Bone Density: Up to date Recommended yearly ophthalmology/optometry visit for glaucoma screening and checkup Recommended yearly dental visit for hygiene and checkup  Vaccinations: Influenza vaccine: N/A Pneumococcal vaccine: Pt declines today.  Tdap vaccine: Pt declines today.  Shingles vaccine: Pt declines today.     Advanced directives: Advance directive discussed with you today. I have provided a copy for you to complete at home and have notarized. Once this is complete please bring a copy in to our office so we can scan it into your chart.  Conditions/risks identified: Recommend increasing water intake to 3 glasses a day.   Next appointment: 9:40 AM today with Dr Beryle Flock   Preventive Care 77 Years and Older, Female Preventive care refers to lifestyle choices and visits with your health care provider that can promote health and wellness. What does preventive care include?  A yearly physical exam. This is also called an annual well check.  Dental exams once or twice a year.  Routine eye exams. Ask your health care provider how often you should have your eyes checked.  Personal lifestyle choices, including:  Daily care of your teeth and gums.  Regular physical activity.  Eating a healthy diet.  Avoiding tobacco and drug use.  Limiting alcohol use.  Practicing safe sex.  Taking low-dose aspirin every day.  Taking vitamin and mineral supplements as recommended by your health care provider. What happens during an annual well check? The services and screenings done by your health care provider during your annual well check will depend on your age, overall health,  lifestyle risk factors, and family history of disease. Counseling  Your health care provider may ask you questions about your:  Alcohol use.  Tobacco use.  Drug use.  Emotional well-being.  Home and relationship well-being.  Sexual activity.  Eating habits.  History of falls.  Memory and ability to understand (cognition).  Work and work Astronomer.  Reproductive health. Screening  You may have the following tests or measurements:  Height, weight, and BMI.  Blood pressure.  Lipid and cholesterol levels. These may be checked every 5 years, or more frequently if you are over 24 years old.  Skin check.  Lung cancer screening. You may have this screening every year starting at age 4 if you have a 30-pack-year history of smoking and currently smoke or have quit within the past 15 years.  Fecal occult blood test (FOBT) of the stool. You may have this test every year starting at age 52.  Flexible sigmoidoscopy or colonoscopy. You may have a sigmoidoscopy every 5 years or a colonoscopy every 10 years starting at age 73.  Hepatitis C blood test.  Hepatitis B blood test.  Sexually transmitted disease (STD) testing.  Diabetes screening. This is done by checking your blood sugar (glucose) after you have not eaten for a while (fasting). You may have this done every 1-3 years.  Bone density scan. This is done to screen for osteoporosis. You may have this done starting at age 61.  Mammogram. This may be done every 1-2 years. Talk to your health care provider about how often you should have regular mammograms. Talk with your health care provider about your test results, treatment  options, and if necessary, the need for more tests. Vaccines  Your health care provider may recommend certain vaccines, such as:  Influenza vaccine. This is recommended every year.  Tetanus, diphtheria, and acellular pertussis (Tdap, Td) vaccine. You may need a Td booster every 10 years.  Zoster  vaccine. You may need this after age 70.  Pneumococcal 13-valent conjugate (PCV13) vaccine. One dose is recommended after age 53.  Pneumococcal polysaccharide (PPSV23) vaccine. One dose is recommended after age 34. Talk to your health care provider about which screenings and vaccines you need and how often you need them. This information is not intended to replace advice given to you by your health care provider. Make sure you discuss any questions you have with your health care provider. Document Released: 02/06/2015 Document Revised: 09/30/2015 Document Reviewed: 11/11/2014 Elsevier Interactive Patient Education  2017 [employer] Prevention in the Home Falls can cause injuries. They can happen to people of all ages. There are many things you can do to make your home safe and to help prevent falls. What can I do on the outside of my home?  Regularly fix the edges of walkways and driveways and fix any cracks.  Remove anything that might make you trip as you walk through a door, such as a raised step or threshold.  Trim any bushes or trees on the path to your home.  Use bright outdoor lighting.  Clear any walking paths of anything that might make someone trip, such as rocks or tools.  Regularly check to see if handrails are loose or broken. Make sure that both sides of any steps have handrails.  Any raised decks and porches should have guardrails on the edges.  Have any leaves, snow, or ice cleared regularly.  Use sand or salt on walking paths during winter.  Clean up any spills in your garage right away. This includes oil or grease spills. What can I do in the bathroom?  Use night lights.  Install grab bars by the toilet and in the tub and shower. Do not use towel bars as grab bars.  Use non-skid mats or decals in the tub or shower.  If you need to sit down in the shower, use a plastic, non-slip stool.  Keep the floor dry. Clean up any water that spills on the  floor as soon as it happens.  Remove soap buildup in the tub or shower regularly.  Attach bath mats securely with double-sided non-slip rug tape.  Do not have throw rugs and other things on the floor that can make you trip. What can I do in the bedroom?  Use night lights.  Make sure that you have a light by your bed that is easy to reach.  Do not use any sheets or blankets that are too big for your bed. They should not hang down onto the floor.  Have a firm chair that has side arms. You can use this for support while you get dressed.  Do not have throw rugs and other things on the floor that can make you trip. What can I do in the kitchen?  Clean up any spills right away.  Avoid walking on wet floors.  Keep items that you use a lot in easy-to-reach places.  If you need to reach something above you, use a strong step stool that has a grab bar.  Keep electrical cords out of the way.  Do not use floor polish or wax that makes floors slippery.  If you must use wax, use non-skid floor wax.  Do not have throw rugs and other things on the floor that can make you trip. What can I do with my stairs?  Do not leave any items on the stairs.  Make sure that there are handrails on both sides of the stairs and use them. Fix handrails that are broken or loose. Make sure that handrails are as long as the stairways.  Check any carpeting to make sure that it is firmly attached to the stairs. Fix any carpet that is loose or worn.  Avoid having throw rugs at the top or bottom of the stairs. If you do have throw rugs, attach them to the floor with carpet tape.  Make sure that you have a light switch at the top of the stairs and the bottom of the stairs. If you do not have them, ask someone to add them for you. What else can I do to help prevent falls?  Wear shoes that:  Do not have high heels.  Have rubber bottoms.  Are comfortable and fit you well.  Are closed at the toe. Do not wear  sandals.  If you use a stepladder:  Make sure that it is fully opened. Do not climb a closed stepladder.  Make sure that both sides of the stepladder are locked into place.  Ask someone to hold it for you, if possible.  Clearly mark and make sure that you can see:  Any grab bars or handrails.  First and last steps.  Where the edge of each step is.  Use tools that help you move around (mobility aids) if they are needed. These include:  Canes.  Walkers.  Scooters.  Crutches.  Turn on the lights when you go into a dark area. Replace any light bulbs as soon as they burn out.  Set up your furniture so you have a clear path. Avoid moving your furniture around.  If any of your floors are uneven, fix them.  If there are any pets around you, be aware of where they are.  Review your medicines with your doctor. Some medicines can make you feel dizzy. This can increase your chance of falling. Ask your doctor what other things that you can do to help prevent falls. This information is not intended to replace advice given to you by your health care provider. Make sure you discuss any questions you have with your health care provider. Document Released: 11/06/2008 Document Revised: 06/18/2015 Document Reviewed: 02/14/2014 Elsevier Interactive Patient Education  2017 [employer].

## 2017-06-02 NOTE — Assessment & Plan Note (Signed)
Uncontrolled but has not taken any medications yet today Discussed that even if fasting, she can take antihypertensives Resume current medications Patient states that her cardiologist told her not to take the losartan She was uncontrolled without losartan previously, so we may start again in future Continue home monitoring of BP and come in sooner if remains elevated Go to ER with signs of stroke Check BMP

## 2017-06-02 NOTE — Assessment & Plan Note (Signed)
2/2 spinal stenosis and DJD Has tried many orthopedic interventions without improvement She states gabapentin and ESI were not helpful States she tried Cymbalta x3 wks and decided that it didn't work and stopped it Given CKD3, avoid chronic NSAIDs

## 2017-06-02 NOTE — Assessment & Plan Note (Signed)
As Ditropan XL was too expensive, will try short acting Oxybutynin BID to decrease bladder spasms Could consider urology referral in the future Discussed possible side effects of Oxybutynin

## 2017-06-02 NOTE — Progress Notes (Signed)
Patient: Gina [name], Female    DOB: [birth year], 77 y.o.   MRN: [id number] Visit Date: 06/02/2017  Today's Provider: Shirlee Latch, MD   I, Joslyn Hy, CMA, am acting as scribe for Shirlee Latch, MD.  Chief Complaint  Patient presents with  . Annual Exam   Subjective:     Complete Physical Gina [name] is a 77 y.o. female. She feels well. She reports exercising none. She reports she is sleeping fairly well.  Pt has never had a colonoscopy. Her last mammogram was more than 30 years ago. She declines her pneumonia vaccine.  Pt is not currently taking cholesterol medication. She states the pharmacy re-shelved this by the time she had her son pick up the medication.  Her BP is elevated today, but she states she has not taken her BP medications today. She is taking amlodipine and metoprolol. Losartan is on her medication list, but she states she is not taking this.   She states her oxybutynin is too expensive. She is requesting  30 day prescription of this so it will be more affordable.  -----------------------------------------------------------   Review of Systems  Constitutional: Negative.   HENT: Positive for sneezing. Negative for congestion, dental problem, drooling, ear discharge, ear pain, facial swelling, hearing loss, mouth sores, nosebleeds, postnasal drip, rhinorrhea, sinus pressure, sinus pain, sore throat, tinnitus, trouble swallowing and voice change.   Eyes: Negative.   Respiratory: Negative.   Cardiovascular: Negative.   Gastrointestinal: Negative.   Endocrine: Positive for polyuria. Negative for cold intolerance, heat intolerance, polydipsia and polyphagia.  Genitourinary: Negative.   Musculoskeletal: Positive for back pain. Negative for arthralgias, gait problem, joint swelling, myalgias, neck pain and neck stiffness.  Skin: Negative.   Allergic/Immunologic: Negative.   Neurological: Negative.   Hematological: Negative.     Psychiatric/Behavioral: Negative.     Social History   Socioeconomic History  . Marital status: Widowed    Spouse name: Not on file  . Number of children: 1  . Years of education: Not on file  . Highest education level: 12th grade  Occupational History  . Occupation: retired  Engineer, production  . Financial resource strain: Not very hard  . Food insecurity:    Worry: Never true    Inability: Never true  . Transportation needs:    Medical: No    Non-medical: No  Tobacco Use  . Smoking status: Former Smoker    Packs/day: 0.50    Years: 5.00    Pack years: 2.50    Last attempt to quit: 01/23/1970    Years since quitting: 47.3  . Smokeless tobacco: Never Used  Substance and Sexual Activity  . Alcohol use: Yes    Comment: rare - 1 drink  . Drug use: No  . Sexual activity: Not on file  Lifestyle  . Physical activity:    Days per week: Not on file    Minutes per session: Not on file  . Stress: Not at all  Relationships  . Social connections:    Talks on phone: Not on file    Gets together: Not on file    Attends religious service: Not on file    Active member of club or organization: Not on file    Attends meetings of clubs or organizations: Not on file    Relationship status: Not on file  . Intimate partner violence:    Fear of current or ex partner: Not on file    Emotionally abused: Not  on file    Physically abused: Not on file    Forced sexual activity: Not on file  Other Topics Concern  . Not on file  Social History Narrative  . Not on file    Past Medical History:  Diagnosis Date  . Abnormal blood sugar 08/03/2007  . Hyperlipidemia   . Hypertension 2010  . Mini stroke (HCC) 2010, 2014     Patient Active Problem List   Diagnosis Date Noted  . Dyspnea on exertion 04/28/2017  . Overactive bladder 04/28/2017  . CKD (chronic kidney disease) stage 3, GFR 30-59 ml/min (HCC) 11/07/2016  . Long-term use of high-risk medication 07/25/2016  . Chronic pain  syndrome 07/25/2016  . Hypertension, essential 03/17/2015  . Arthritis 09/01/2014  . 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 History of CVA (cerebrovascular accident) 07/18/2014  . Mononeuropathy of lower extremity 07/18/2014  . Adnexal mass 07/18/2014  . Avitaminosis D 07/18/2014  . Impacted cerumen of right ear 07/18/2014  . Cerebral artery occlusion with cerebral infarction (HCC) 05/04/2009  . Disorder of mitral valve 04/21/2009  . 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 Allergic rhinitis 06/13/2008  . Acid reflux 08/04/2007  . Hypercholesteremia 08/03/2007    Past Surgical History:  Procedure Laterality Date  . BREAST SURGERY Left 1982?   biopsy  . CESAREAN SECTION    . HERNIA REPAIR Left 2014   Laparoscopic placement  10 x 15 cm Physiomesh at lateral abdominal wall port site hernia  . HIP SURGERY Left 2013  . OOPHORECTOMY  2013    Her family history includes Congestive Heart Failure (age of onset: 38) in her mother; Heart disease (age of onset: 68) in her father; Heart failure (age of onset: 65) in her brother; Rheumatic fever in her father and sister.      Current Outpatient Medications:  .  acetaminophen (TYLENOL) 325 MG tablet, Take by mouth every 6 (six) hours as needed for pain., Disp: , Rfl:  .  amLODipine (NORVASC) 5 MG tablet, Take 1 tablet (5 mg total) by mouth daily., Disp: 90 tablet, Rfl: 2 .  aspirin 325 MG tablet, Take 325 mg by mouth daily., Disp: , Rfl:  .  cetirizine (ZYRTEC) 10 MG tablet, Take 10 mg by mouth daily. Reported on 08/10/2015, Disp: , Rfl:  .  cholecalciferol (VITAMIN D) 1000 units tablet, Take 1,000 Units by mouth daily., Disp: , Rfl:  .  metoprolol (LOPRESSOR) 100 MG tablet, Take 100 mg by mouth 2 (two) times daily., Disp: , Rfl:  .  ranitidine (ZANTAC) 150 MG tablet, Take 150 mg by mouth daily. , Disp: , Rfl:  .  atorvastatin (LIPITOR) 40 MG tablet, Take 1 tablet (40 mg total) by mouth daily. (Patient not taking: Reported on  01/16/2017), Disp: 90 tablet, Rfl: 3 .  losartan (COZAAR) 50 MG tablet, Take 1 tablet (50 mg total) by mouth daily. (Patient not taking: Reported on 06/02/2017), Disp: 90 tablet, Rfl: 3 .  oxybutynin (DITROPAN-XL) 5 MG 24 hr tablet, Take 1 tablet (5 mg total) by mouth at bedtime. (Patient not taking: Reported on 06/02/2017), Disp: 30 tablet, Rfl: 2  Patient Care Team: Erasmo Downer, MD as PCP - General (Family Medicine) Lady Gary Darlin Priestly, MD as Consulting Physician (Cardiology)     Objective:   Vitals: BP (!) 190/82 (BP Location: Left Arm, Patient Position: Sitting, Cuff Size: Normal)   Pulse 68   Temp 98.7 F (37.1 C) (Oral)   Resp 16   Ht  (1.626 m)  Wt 134 lb (60.8 kg)   BMI 23.00 kg/m   Physical Exam  Constitutional: She is oriented to person, place, and time. She appears well-developed and well-nourished. No distress.  HENT:  Head: Normocephalic and atraumatic.  Right Ear: External ear normal.  Left Ear: External ear normal.  Nose: Nose normal.  Mouth/Throat: Oropharynx is clear and moist.  Eyes: Pupils are equal, round, and reactive to light. Conjunctivae and EOM are normal. No scleral icterus.  Neck: Neck supple. No thyromegaly present.  Cardiovascular: Normal rate, regular rhythm, normal heart sounds and intact distal pulses.  No murmur heard. Pulmonary/Chest: Effort normal and breath sounds normal. No respiratory distress. She has no wheezes. She has no rales.  Abdominal: Soft. Bowel sounds are normal. She exhibits no distension. There is no tenderness. There is no rebound and no guarding.  Musculoskeletal: She exhibits no edema or deformity.  Lymphadenopathy:    She has no cervical adenopathy.  Neurological: She is alert and oriented to person, place, and time.  Skin: Skin is warm and dry. Capillary refill takes less than 2 seconds. No rash noted.  Psychiatric: She has a normal mood and affect. Her behavior is normal.  Vitals reviewed.   Activities of  Daily Living In your present state of health, do you have any difficulty performing the following activities: 06/02/2017  Hearing? N  Vision? N  Difficulty concentrating or making decisions? N  Walking or climbing stairs? Y  Comment Due to knee pain.   Dressing or bathing? N  Doing errands, shopping? N  Preparing Food and eating ? N  Using the Toilet? N  In the past six months, have you accidently leaked urine? Y  Comment Wears protection occasionally, has more trouble at night.   Do you have problems with loss of bowel control? N  Managing your Medications? N  Managing your Finances? N  Housekeeping or managing your Housekeeping? N  Some recent data might be hidden    Fall Risk Assessment Fall Risk  06/02/2017  Falls in the past year? No     Depression Screen PHQ 2/9 Scores 06/02/2017 06/02/2017  PHQ - 2 Score 0 0  PHQ- 9 Score 1 -    Cognitive Testing - 6-CIT  Correct? Score   What year is it? yes 0 0 or 4  What month is it? yes 0 0 or 3  Memorize:    Floyde Parkins,  42,  [address]  [place],      What time is it? (within 1 hour) yes 0 0 or 3  Count backwards from 20 yes 0 0, 2, or 4  Name the months of the year yes 0 0, 2, or 4  Repeat name & address above no 2 0, 2, 4, 6, 8, or 10       TOTAL SCORE  2/28   Interpretation:  Normal  Normal (0-7) Abnormal (8-28)       Assessment & Plan:    Annual Physical Reviewed patient's Family Medical History Reviewed and updated list of patient's medical providers Assessment of cognitive impairment was done Assessed patient's functional ability Established a written schedule for health screening services Health Risk Assessent Completed and Reviewed  Exercise Activities and Dietary recommendations Goals    . DIET - INCREASE WATER INTAKE     Recommend increasing water intake to 3 glasses a day.         There is no immunization history on file for this patient.  Health Maintenance  Topic Date Due  . TETANUS/TDAP   01/27/1959  . PNA vac Low Risk Adult (1 of 2 - PCV13) 01/26/2005  . INFLUENZA VACCINE  08/24/2017  . DEXA SCAN  Completed     Discussed health benefits of physical activity, and encouraged her to engage in regular exercise appropriate for her age and condition.    ------------------------------------------------------------------------------------------------------------ Problem List Items Addressed This Visit      Cardiovascular and Mediastinum   Hypertension, essential    Uncontrolled but has not taken any medications yet today Discussed that even if fasting, she can take antihypertensives Resume current medications Patient states that her cardiologist told her not to take the losartan She was uncontrolled without losartan previously, so we may start again in future Continue home monitoring of BP and come in sooner if remains elevated Go to ER with signs of stroke Check BMP      Relevant Orders   Basic Metabolic Panel (BMET)     Genitourinary   Overactive bladder    As Ditropan XL was too expensive, will try short acting Oxybutynin BID to decrease bladder spasms Could consider urology referral in the future Discussed possible side effects of Oxybutynin        Other   Chronic pain syndrome (Chronic)    2/2 spinal stenosis and DJD Has tried many orthopedic interventions without improvement She states gabapentin and ESI were not helpful States she tried Cymbalta x3 wks and decided that it didn't work and stopped it Given CKD3, avoid chronic NSAIDs      Hypercholesteremia    Given h/o CVA and LDL >70, needs secondary prevention She has not started statin Advised again to start this F/u in 6 months       Other Visit Diagnoses    Encounter for annual physical exam    -  Primary       Return in about 6 months (around 12/03/2017) for chronic disease f/u.   The entirety of the information documented in the History of Present Illness, Review of Systems and  Physical Exam were personally obtained by me. Portions of this information were initially documented by Irving Burton Ratchford, CMA and reviewed by me for thoroughness and accuracy.    Erasmo Downer, MD, MPH [facility] 06/02/2017 10:45 AM

## 2017-06-02 NOTE — Progress Notes (Addendum)
Subjective:   Gina [name] is a 77 y.o. female who presents for an Initial Medicare Annual Wellness Visit.  Review of Systems    N/A  Cardiac Risk Factors include: advanced age (>70men, >39 women);dyslipidemia;hypertension     Objective:    Today's Vitals   06/02/17 0911 06/02/17 0937  BP: (!) 162/76 (!) 156/84  Pulse: 68   Temp: 98.7 F (37.1 C)   TempSrc: Oral   Weight: 134 lb (60.8 kg)   Height:  (1.626 m)   PainSc: 0-No pain    Body mass index is 23 kg/m.  Advanced Directives 06/02/2017 09/01/2014  Does Patient Have a Medical Advance Directive? No No  Would patient like information on creating a medical advance directive? Yes (MAU/Ambulatory/Procedural Areas - Information given) -    Current Medications (verified) Outpatient Encounter Medications as of 06/02/2017  Medication Sig  . acetaminophen (TYLENOL) 325 MG tablet Take by mouth every 6 (six) hours as needed for pain.  Marland Kitchen amLODipine (NORVASC) 5 MG tablet Take 1 tablet (5 mg total) by mouth daily.  Marland Kitchen aspirin 325 MG tablet Take 325 mg by mouth daily.  . cetirizine (ZYRTEC) 10 MG tablet Take 10 mg by mouth daily. Reported on 08/10/2015  . cholecalciferol (VITAMIN D) 1000 units tablet Take 1,000 Units by mouth daily.  . metoprolol (LOPRESSOR) 100 MG tablet Take 100 mg by mouth 2 (two) times daily.  . ranitidine (ZANTAC) 150 MG tablet Take 150 mg by mouth daily.   Marland Kitchen atorvastatin (LIPITOR) 40 MG tablet Take 1 tablet (40 mg total) by mouth daily. (Patient not taking: Reported on 01/16/2017)  . DULoxetine (CYMBALTA) 20 MG capsule Take 1 capsule (20 mg total) by mouth daily for 14 days, THEN 2 capsules (40 mg total) daily. (Patient not taking: No sig reported)  . losartan (COZAAR) 50 MG tablet Take 1 tablet (50 mg total) by mouth daily. (Patient not taking: Reported on 06/02/2017)  . oxybutynin (DITROPAN-XL) 5 MG 24 hr tablet Take 1 tablet (5 mg total) by mouth at bedtime. (Patient not taking: Reported on 06/02/2017)   No  facility-administered encounter medications on file as of 06/02/2017.     Allergies (verified) Hydrochlorothiazide; Chlorthalidone; Lisinopril; and Penicillins   History: Past Medical History:  Diagnosis Date  . Abnormal blood sugar 08/03/2007  . Hyperlipidemia   . Hypertension 2010  . Mini stroke (HCC) 2010, 2014   Past Surgical History:  Procedure Laterality Date  . BREAST SURGERY Left 1982?   biopsy  . CESAREAN SECTION    . HERNIA REPAIR Left 2014   Laparoscopic placement  10 x 15 cm Physiomesh at lateral abdominal wall port site hernia  . HIP SURGERY Left 2013  . OOPHORECTOMY  2013   Family History  Problem Relation Age of Onset  . Congestive Heart Failure Mother 78       Glucose intolerance  . Heart disease Father 24       Valvular  . Rheumatic fever Father   . Heart failure Brother 77       heart valve  . Rheumatic fever Sister    Social History   Socioeconomic History  . Marital status: Widowed    Spouse name: Not on file  . Number of children: 1  . Years of education: Not on file  . Highest education level: 12th grade  Occupational History  . Occupation: retired  Engineer, production  . Financial resource strain: Not very hard  . Food insecurity:  Worry: Never true    Inability: Never true  . Transportation needs:    Medical: No    Non-medical: No  Tobacco Use  . Smoking status: Former Smoker    Packs/day: 0.50    Years: 5.00    Pack years: 2.50    Last attempt to quit: 01/23/1970    Years since quitting: 47.3  . Smokeless tobacco: Never Used  Substance and Sexual Activity  . Alcohol use: Yes    Comment: rare - 1 drink  . Drug use: No  . Sexual activity: Not on file  Lifestyle  . Physical activity:    Days per week: Not on file    Minutes per session: Not on file  . Stress: Not at all  Relationships  . Social connections:    Talks on phone: Not on file    Gets together: Not on file    Attends religious service: Not on file    Active member  of club or organization: Not on file    Attends meetings of clubs or organizations: Not on file    Relationship status: Not on file  Other Topics Concern  . Not on file  Social History Narrative  . Not on file    Tobacco Counseling Counseling given: Not Answered   Clinical Intake:  Pre-visit preparation completed: Yes  Pain : No/denies pain Pain Score: 0-No pain     Nutritional Status: BMI of 19-24  Normal Nutritional Risks: None Diabetes: No  How often do you need to have someone help you when you read instructions, pamphlets, or other written materials from your doctor or pharmacy?: 1 - Never  Interpreter Needed?: No  Information entered by :: [facility], LPN   Activities of Daily Living In your present state of health, do you have any difficulty performing the following activities: 06/02/2017  Hearing? N  Vision? N  Difficulty concentrating or making decisions? N  Walking or climbing stairs? Y  Comment Due to knee pain.   Dressing or bathing? N  Doing errands, shopping? N  Preparing Food and eating ? N  Using the Toilet? N  In the past six months, have you accidently leaked urine? Y  Comment Wears protection occasionally, has more trouble at night.   Do you have problems with loss of bowel control? N  Managing your Medications? N  Managing your Finances? N  Housekeeping or managing your Housekeeping? N  Some recent data might be hidden     Immunizations and Health Maintenance  There is no immunization history on file for this patient. Health Maintenance Due  Topic Date Due  . TETANUS/TDAP  01/27/1959  . PNA vac Low Risk Adult (1 of 2 - PCV13) 01/26/2005    Patient Care Team: Erasmo Downer, MD as PCP - General (Family Medicine) Lady Gary Darlin Priestly, MD as Consulting Physician (Cardiology)  Indicate any recent Medical Services you may have received from other than [facility] providers in the past year (date may be approximate).     Assessment:   This  is a routine wellness examination for [facility].  Hearing/Vision screen Vision Screening Comments: Pt does not have regular vision checks. Last eye exam was in 2001. Pt plans to set up an apt this year due to cataracts.  Dietary issues and exercise activities discussed: Current Exercise Habits: The patient does not participate in regular exercise at present, Exercise limited by: orthopedic condition(s)  Goals    . DIET - INCREASE WATER INTAKE  Recommend increasing water intake to 3 glasses a day.       Depression Screen PHQ 2/9 Scores 06/02/2017 06/02/2017  PHQ - 2 Score 0 0  PHQ- 9 Score 1 -    Fall Risk Fall Risk  06/02/2017  Falls in the past year? No    Is the patient's home free of loose throw rugs in walkways, pet beds, electrical cords, etc?   yes      Grab bars in the bathroom? no      Handrails on the stairs?   yes      Adequate lighting?   yes  Timed Get Up and Go Performed N/A  Cognitive Function: Pt declined screening today.         Screening Tests Health Maintenance  Topic Date Due  . TETANUS/TDAP  01/27/1959  . PNA vac Low Risk Adult (1 of 2 - PCV13) 01/26/2005  . INFLUENZA VACCINE  08/24/2017  . DEXA SCAN  Completed    Qualifies for Shingles Vaccine? Due for Shingles vaccine. Declined my offer to administer today. Education has been provided regarding the importance of this vaccine. Pt has been advised to call her insurance company to determine her out of pocket expense. Advised she may also receive this vaccine at her local pharmacy or Health Dept. Verbalized acceptance and understanding.  Cancer Screenings: Lung: Low Dose CT Chest recommended if Age 33-80 years, 30 pack-year currently smoking OR have quit w/in 15years. Patient does not qualify. Breast: Up to date on Mammogram? N/A  Up to date of Bone Density/Dexa? Yes Colorectal: N/A  Additional Screenings:  Hepatitis C Screening: N/A     Plan:  I have personally reviewed and addressed the  Medicare Annual Wellness questionnaire and have noted the following in the patient's chart:  A. Medical and social history B. Use of alcohol, tobacco or illicit drugs  C. Current medications and supplements D. Functional ability and status E.  Nutritional status F.  Physical activity G. Advance directives H. List of other physicians I.  Hospitalizations, surgeries, and ER visits in previous 12 months J.  Vitals K. Screenings such as hearing and vision if needed, cognitive and depression L. Referrals and appointments - none  In addition, I have reviewed and discussed with patient certain preventive protocols, quality metrics, and best practice recommendations. A written personalized care plan for preventive services as well as general preventive health recommendations were provided to patient.  See attached scanned questionnaire for additional information.   Signed,  Hyacinth Meeker, LPN Nurse Health Advisor   Nurse Recommendations: Pt declined the tetanus and pneumonia vaccines today.

## 2017-06-03 LAB — BASIC METABOLIC PANEL
BUN / CREAT RATIO: 18 (ref 12–28)
BUN: 19 mg/dL (ref 8–27)
CHLORIDE: 103 mmol/L (ref 96–106)
CO2: 22 mmol/L (ref 20–29)
CREATININE: 1.04 mg/dL — AB (ref 0.57–1.00)
Calcium: 9.8 mg/dL (ref 8.7–10.3)
GFR calc Af Amer: 60 mL/min/{1.73_m2} (ref >59)
GFR calc non Af Amer: 52 mL/min/{1.73_m2} — ABNORMAL LOW (ref >59)
GLUCOSE: 98 mg/dL (ref 65–99)
Potassium: 4.2 mmol/L (ref 3.5–5.2)
Sodium: 142 mmol/L (ref 134–144)

## 2017-06-05 ENCOUNTER — Telehealth: Payer: Self-pay

## 2017-06-05 NOTE — Telephone Encounter (Signed)
Pt advised.

## 2017-06-05 NOTE — Telephone Encounter (Signed)
-----   Message from Erasmo Downer, MD sent at 06/05/2017  8:27 AM EDT ----- Stable kidney function. Normal electrolytes.  Erasmo Downer, MD, MPH [facility] 06/05/2017 8:27 AM

## 2017-07-28 ENCOUNTER — Ambulatory Visit: Payer: Self-pay | Admitting: Family Medicine

## 2017-08-04 ENCOUNTER — Ambulatory Visit: Payer: Self-pay | Admitting: Family Medicine

## 2017-08-08 ENCOUNTER — Ambulatory Visit (INDEPENDENT_AMBULATORY_CARE_PROVIDER_SITE_OTHER): Payer: Medicare Other | Admitting: Family Medicine

## 2017-08-08 ENCOUNTER — Encounter: Payer: Self-pay | Admitting: Family Medicine

## 2017-08-08 VITALS — BP 174/98 | HR 70 | Temp 97.6°F | Resp 16 | Wt 138.0 lb

## 2017-08-08 DIAGNOSIS — N3281 Overactive bladder: Secondary | ICD-10-CM

## 2017-08-08 DIAGNOSIS — E7800 Pure hypercholesterolemia, unspecified: Secondary | ICD-10-CM

## 2017-08-08 DIAGNOSIS — I10 Essential (primary) hypertension: Secondary | ICD-10-CM | POA: Diagnosis not present

## 2017-08-08 MED ORDER — OXYBUTYNIN CHLORIDE 5 MG PO TABS: 5.0000 mg | ORAL_TABLET | Freq: Every day | ORAL | 3 refills | Status: DC

## 2017-08-08 MED ORDER — LOSARTAN POTASSIUM 50 MG PO TABS: 50.0000 mg | ORAL_TABLET | Freq: Every day | ORAL | 3 refills | Status: DC

## 2017-08-08 NOTE — Patient Instructions (Signed)
Continue amlodipine and metoprolol - no changes to doses  Start losartan 50mg  daily  OK to take bladder medicine only once daily  Start cholesterol pill (atorvastatin)

## 2017-08-08 NOTE — Assessment & Plan Note (Signed)
Improving with some leakage remaining Ok to continue Oxybutinin only once daily as this seems to help

## 2017-08-08 NOTE — Assessment & Plan Note (Signed)
Remains uncontrolled She has told me in the past that Dr Lady GaryFath stopped her losartan, but I believed that patient had stopped it before she saw him last time and he makes no mention of losartan She did not tolerate higher dose amlodipine 2/2 LE edema Continue amlodipien and metoprolol at current dose Restart losartan 50mg  daily Continue home BP monitoring and bring log to next visit F/u in 1 month

## 2017-08-08 NOTE — Assessment & Plan Note (Addendum)
Has not started statin Encouraged again today to go and pick this up and start it She seems to have difficult remembering changes that are made in her medications

## 2017-08-08 NOTE — Progress Notes (Signed)
Patient: Gina ApleyMary E Rash Female    DOB: 02/04/1940   77 y.o.   MRN: [id number] Visit Date: 08/08/2017  Today's Provider: Shirlee LatchAngela Patina Spanier, MD   I, Joslyn HyEmily Ratchford, CMA, am acting as scribe for Shirlee LatchAngela Acie Custis, MD.  Chief Complaint  Patient presents with  . Hypertension  . Over Active Bladder   Subjective:    HPI       Hypertension, follow-up:  BP Readings from Last 3 Encounters:  08/08/17 (!) 174/98  06/02/17 (!) 190/82  06/02/17 (!) 156/84    She was last seen for hypertension 2 months ago.  BP at that visit was 190/82. (Pt did not take BP medication that morning.) Management since that visit includes resuming medications. She reports good compliance with treatment. She is not having side effects.  She is not exercising. She is adherent to low salt diet.   Outside blood pressures are 140-145/60. She is experiencing none.  Patient denies chest pain, chest pressure/discomfort, claudication, dyspnea, exertional chest pressure/discomfort, fatigue, irregular heart beat, lower extremity edema, near-syncope, orthopnea, palpitations and syncope.   Cardiovascular risk factors include advanced age (older than 1255 for men, 665 for women), dyslipidemia and hypertension.  Use of agents associated with hypertension: none.     Weight trend: stable Wt Readings from Last 3 Encounters:  08/08/17 138 lb (62.6 kg)  06/02/17 134 lb (60.8 kg)  06/02/17 134 lb (60.8 kg)    Current diet: eats fast food  ------------------------------------------------------------------------  Follow up for Overactive Bladder  The patient was last seen for this 2 months ago. Changes made at last visit include starting Oxybutynin BID.  She reports good compliance with treatment. She feels that condition is Improved. Still has some incontinence, but is improved. She is having side effects. She felt "exhausted" when she took this medication BID, so she is only taking this once daily. She is  tolerating this dose well.  ------------------------------------------------------------------------------------    Allergies  Allergen Reactions  . Hydrochlorothiazide   . Chlorthalidone Nausea Only  . Lisinopril Cough  . Penicillins Rash     Current Outpatient Medications:  .  acetaminophen (TYLENOL) 325 MG tablet, Take by mouth every 6 (six) hours as needed for pain., Disp: , Rfl:  .  amLODipine (NORVASC) 5 MG tablet, Take 1 tablet (5 mg total) by mouth daily., Disp: 90 tablet, Rfl: 2 .  aspirin 325 MG tablet, Take 325 mg by mouth daily., Disp: , Rfl:  .  cetirizine (ZYRTEC) 10 MG tablet, Take 10 mg by mouth daily. Reported on 08/10/2015, Disp: , Rfl:  .  cholecalciferol (VITAMIN D) 1000 units tablet, Take 1,000 Units by mouth daily., Disp: , Rfl:  .  metoprolol (LOPRESSOR) 100 MG tablet, Take 100 mg by mouth 2 (two) times daily., Disp: , Rfl:  .  oxybutynin (DITROPAN) 5 MG tablet, Take 1 tablet (5 mg total) by mouth 2 (two) times daily., Disp: 60 tablet, Rfl: 3 .  ranitidine (ZANTAC) 150 MG tablet, Take 150 mg by mouth daily. , Disp: , Rfl:  .  atorvastatin (LIPITOR) 40 MG tablet, Take 1 tablet (40 mg total) by mouth daily. (Patient not taking: Reported on 01/16/2017), Disp: 90 tablet, Rfl: 3  Review of Systems  Constitutional: Negative for activity change, appetite change, chills, diaphoresis, fatigue, fever and unexpected weight change.  Respiratory: Negative for shortness of breath.   Cardiovascular: Negative for chest pain, palpitations and leg swelling.  Genitourinary: Positive for urgency.    Social History  Tobacco Use  . Smoking status: Former Smoker    Packs/day: 0.50    Years: 5.00    Pack years: 2.50    Last attempt to quit: 01/23/1970    Years since quitting: 47.5  . Smokeless tobacco: Never Used  Substance Use Topics  . Alcohol use: Yes    Comment: rare - 1 drink   Objective:   BP (!) 174/98 (BP Location: Left Arm, Patient Position: Sitting, Cuff Size:  Normal)   Pulse 70   Temp 97.6 F (36.4 C) (Oral)   Resp 16   Wt 138 lb (62.6 kg)   SpO2 96%   BMI 23.69 kg/m  Vitals:   08/08/17 0946  BP: (!) 174/98  Pulse: 70  Resp: 16  Temp: 97.6 F (36.4 C)  TempSrc: Oral  SpO2: 96%  Weight: 138 lb (62.6 kg)     Physical Exam  Constitutional: She is oriented to person, place, and time. She appears well-developed and well-nourished. No distress.  HENT:  Head: Normocephalic and atraumatic.  Eyes: Conjunctivae are normal. No scleral icterus.  Neck: Neck supple. No thyromegaly present.  Cardiovascular: Normal rate, regular rhythm, normal heart sounds and intact distal pulses.  No murmur heard. Pulmonary/Chest: Effort normal and breath sounds normal. No respiratory distress. She has no wheezes. She has no rales.  Musculoskeletal: She exhibits no edema.  Lymphadenopathy:    She has no cervical adenopathy.  Neurological: She is alert and oriented to person, place, and time.  Skin: Skin is warm and dry. Capillary refill takes less than 2 seconds. No rash noted.  Psychiatric: She has a normal mood and affect. Her behavior is normal.  Vitals reviewed.       Assessment & Plan:   Problem List Items Addressed This Visit      Cardiovascular and Mediastinum   Hypertension, essential - Primary    Remains uncontrolled She has told me in the past that Dr Lady Gary stopped her losartan, but I believed that patient had stopped it before she saw him last time and he makes no mention of losartan She did not tolerate higher dose amlodipine 2/2 LE edema Continue amlodipien and metoprolol at current dose Restart losartan 50mg  daily Continue home BP monitoring and bring log to next visit F/u in 1 month      Relevant Medications   losartan (COZAAR) 50 MG tablet     Genitourinary   Overactive bladder    Improving with some leakage remaining Ok to continue Oxybutinin only once daily as this seems to help        Other   Hypercholesteremia     Has not started statin Encouraged again today to go and pick this up and start it She seems to have difficult remembering changes that are made in her medications      Relevant Medications   losartan (COZAAR) 50 MG tablet       Return in about 1 month (around 09/05/2017) for BP f/u.   The entirety of the information documented in the History of Present Illness, Review of Systems and Physical Exam were personally obtained by me. Portions of this information were initially documented by Irving Burton Ratchford, CMA and reviewed by me for thoroughness and accuracy.    Erasmo Downer, MD, MPH [facility] 08/08/2017 10:19 AM

## 2017-09-07 ENCOUNTER — Ambulatory Visit: Payer: Self-pay | Admitting: Family Medicine

## 2017-09-12 ENCOUNTER — Ambulatory Visit (INDEPENDENT_AMBULATORY_CARE_PROVIDER_SITE_OTHER): Payer: Medicare Other | Admitting: Family Medicine

## 2017-09-12 ENCOUNTER — Encounter: Payer: Self-pay | Admitting: Family Medicine

## 2017-09-12 VITALS — BP 170/84 | HR 68 | Temp 97.4°F | Resp 20 | Wt 138.0 lb

## 2017-09-12 DIAGNOSIS — Z8673 Personal history of transient ischemic attack (TIA), and cerebral infarction without residual deficits: Secondary | ICD-10-CM

## 2017-09-12 DIAGNOSIS — E7800 Pure hypercholesterolemia, unspecified: Secondary | ICD-10-CM | POA: Diagnosis not present

## 2017-09-12 DIAGNOSIS — N3281 Overactive bladder: Secondary | ICD-10-CM | POA: Diagnosis not present

## 2017-09-12 DIAGNOSIS — I10 Essential (primary) hypertension: Secondary | ICD-10-CM

## 2017-09-12 MED ORDER — MIRABEGRON ER 25 MG PO TB24: 25.0000 mg | ORAL_TABLET | Freq: Every day | ORAL | 3 refills | Status: DC

## 2017-09-12 NOTE — Progress Notes (Signed)
Patient: Gina [name] Female    DOB: 05/12/1940   77 y.o.   MRN: [id number] Visit Date: 09/12/2017  Today's Provider: Shirlee LatchAngela Sumaya Riedesel, MD   I, Joslyn HyEmily Ratchford, CMA, am acting as scribe for Shirlee LatchAngela Tayten Bergdoll, MD.  Chief Complaint  Patient presents with  . Hypertension   Subjective:    HPI  Hypertension, follow-up:  BP Readings from Last 3 Encounters:  09/12/17 (!) 170/84  08/08/17 (!) 174/98  06/02/17 (!) 190/82   She was last seen for hypertension 1 month ago.  BP at that visit was 174/98 Management since that visit includes continue Amlodipine and Metoprolol. Advised to restart Losartan 50 mg daily. She reports good compliance with treatment. She is not having side effects.  She is not exercising. She is adherent to low salt diet.   Outside systolic blood pressures are in the 130's. She is experiencing none. Due to left leg pain. Patient denies chest pain, chest pressure/discomfort, claudication, dyspnea, exertional chest pressure/discomfort, fatigue, irregular heart beat, lower extremity edema, near-syncope, orthopnea, palpitations and syncope.   Cardiovascular risk factors include advanced age (older than 355 for men, 9565 for women), dyslipidemia and hypertension.  Use of agents associated with hypertension: advanced age, hypertension, and hypercholesteremia.                Weight trend: stable Wt Readings from Last 3 Encounters:  09/12/17 138 lb (62.6 kg)  08/08/17 138 lb (62.6 kg)  06/02/17 134 lb (60.8 kg)    Current diet: pt admits to eating out "some".   Pt states she had to D/C her oxybutynin. This caused hoarseness, constipation and abdominal pain. She is wondering if there is an alternative medication to take for incontinence.   She has yet to pick up the atorvastatin. She states the pharmacy will need confirmation that the dose has not changed since it has been so long since it was prescribed.  Allergies  Allergen Reactions  . Hydrochlorothiazide     . Chlorthalidone Nausea Only  . Lisinopril Cough  . Penicillins Rash     Current Outpatient Medications:  .  acetaminophen (TYLENOL) 325 MG tablet, Take by mouth every 6 (six) hours as needed for pain., Disp: , Rfl:  .  amLODipine (NORVASC) 5 MG tablet, Take 1 tablet (5 mg total) by mouth daily., Disp: 90 tablet, Rfl: 2 .  aspirin 325 MG tablet, Take 325 mg by mouth daily., Disp: , Rfl:  .  cetirizine (ZYRTEC) 10 MG tablet, Take 10 mg by mouth daily. Reported on 08/10/2015, Disp: , Rfl:  .  cholecalciferol (VITAMIN D) 1000 units tablet, Take 1,000 Units by mouth daily., Disp: , Rfl:  .  losartan (COZAAR) 50 MG tablet, Take 1 tablet (50 mg total) by mouth daily., Disp: 30 tablet, Rfl: 3 .  metoprolol (LOPRESSOR) 100 MG tablet, Take 100 mg by mouth 2 (two) times daily., Disp: , Rfl:  .  ranitidine (ZANTAC) 150 MG tablet, Take 150 mg by mouth daily. , Disp: , Rfl:  .  atorvastatin (LIPITOR) 40 MG tablet, Take 1 tablet (40 mg total) by mouth daily. (Patient not taking: Reported on 01/16/2017), Disp: 90 tablet, Rfl: 3  Review of Systems  Constitutional: Negative.   Respiratory: Negative.   Cardiovascular: Negative.   Gastrointestinal: Negative.   Genitourinary: Positive for enuresis. Negative for difficulty urinating, dysuria, frequency, hematuria, pelvic pain and vaginal discharge.  Musculoskeletal: Positive for back pain, gait problem and myalgias (left leg pain).  Skin: Negative.  Neurological: Negative for dizziness, weakness, light-headedness and numbness.  Psychiatric/Behavioral: Negative.     Social History   Tobacco Use  . Smoking status: Former Smoker    Packs/day: 0.50    Years: 5.00    Pack years: 2.50    Last attempt to quit: 01/23/1970    Years since quitting: 47.6  . Smokeless tobacco: Never Used  Substance Use Topics  . Alcohol use: Yes    Comment: rare - 1 drink   Objective:   BP (!) 170/84 (BP Location: Left Arm, Patient Position: Sitting, Cuff Size: Normal)  Comment: pt has not taken BP meds  Pulse 68   Temp (!) 97.4 F (36.3 C) (Oral)   Resp 20   Wt 138 lb (62.6 kg)   SpO2 98%   BMI 23.69 kg/m  Vitals:   09/12/17 0957  BP: (!) 170/84  Pulse: 68  Resp: 20  Temp: (!) 97.4 F (36.3 C)  TempSrc: Oral  SpO2: 98%  Weight: 138 lb (62.6 kg)     Physical Exam  Constitutional: She is oriented to person, place, and time. She appears well-developed and well-nourished. No distress.  HENT:  Head: Normocephalic and atraumatic.  Eyes: Conjunctivae are normal. No scleral icterus.  Neck: Neck supple. No thyromegaly present.  Cardiovascular: Normal rate, regular rhythm, normal heart sounds and intact distal pulses.  No murmur heard. Pulmonary/Chest: Effort normal and breath sounds normal. No respiratory distress. She has no wheezes. She has no rales.  Abdominal: Soft. She exhibits no distension. There is no tenderness.  Musculoskeletal: She exhibits no edema.  Lymphadenopathy:    She has no cervical adenopathy.  Neurological: She is alert and oriented to person, place, and time.  Skin: Skin is warm and dry. Capillary refill takes less than 2 seconds. No rash noted.  Psychiatric: She has a normal mood and affect. Her behavior is normal.  Vitals reviewed.      Assessment & Plan:   Problem List Items Addressed This Visit      Cardiovascular and Mediastinum   Hypertension, essential - Primary    Remains uncontrolled Compliance with her medications has been an issue in the past She states she has not taken her medications yet this morning She states that her home blood pressures are well controlled Encouraged her to bring her home blood pressure cuff, log, and medications to her next visit I will continue her amlodipine, metoprolol, losartan at current doses Follow-up in 1 month        Genitourinary   Overactive bladder    Did not tolerate anticholinergic side effects of oxybutynin This is been discontinued Try Myrbetriq instead          Other   History of CVA (cerebrovascular accident)    Again discussed importance of secondary prevention with the patient Encouraged her again to take her statin Continue aspirin She also needs good control of her hypertension      Hypercholesteremia    Patient still has not started her statin Encouraged her again today to go and pick this up and started She seems to have difficulty remembering changes made in her medications Encouraged that given that she has had a stroke in the past, she needs to take this for secondary prevention to help decrease her stroke risk          Return in about 6 weeks (around 10/24/2017) for BP f/u.   The entirety of the information documented in the History of Present Illness, Review of Systems  and Physical Exam were personally obtained by me. Portions of this information were initially documented by Irving BurtonEmily Ratchford, CMA and reviewed by me for thoroughness and accuracy.    Erasmo DownerBacigalupo, Arnell Mausolf M, MD, MPH [facility] 09/13/2017 9:07 AM

## 2017-09-12 NOTE — Patient Instructions (Signed)
Pick up the cholesterol medicine and start taking - atorvastatin 40mg  daily  Continue losartan, amlodipine, and metoprolol for blood pressure Bring blood pressure medicines, cuff, and log to next visit Take blood pressure pills before next visit  Start Myrbetriq instead of Oxybutinin for your bladder

## 2017-09-13 ENCOUNTER — Telehealth: Payer: Self-pay | Admitting: Family Medicine

## 2017-09-13 NOTE — Assessment & Plan Note (Signed)
Remains uncontrolled Compliance with her medications has been an issue in the past She states she has not taken her medications yet this morning She states that her home blood pressures are well controlled Encouraged her to bring her home blood pressure cuff, log, and medications to her next visit I will continue her amlodipine, metoprolol, losartan at current doses Follow-up in 1 month

## 2017-09-13 NOTE — Telephone Encounter (Signed)
Any suggestions for a lower cost med?

## 2017-09-13 NOTE — Assessment & Plan Note (Signed)
Patient still has not started her statin Encouraged her again today to go and pick this up and started She seems to have difficulty remembering changes made in her medications Encouraged that given that she has had a stroke in the past, she needs to take this for secondary prevention to help decrease her stroke risk

## 2017-09-13 NOTE — Assessment & Plan Note (Signed)
Again discussed importance of secondary prevention with the patient Encouraged her again to take her statin Continue aspirin She also needs good control of her hypertension

## 2017-09-13 NOTE — Telephone Encounter (Signed)
All other medications for this have similar side effects to oxybutinin as they are in the same class.  Thought we'd receive a PA for Myrbetriq to get better coverage.  Could refer to Urology to see if they would have other options or be able to get it cheaper.  Erasmo DownerBacigalupo, Jalaila Caradonna M, MD, MPH [facility] 09/13/2017 2:45 PM

## 2017-09-13 NOTE — Telephone Encounter (Signed)
Pt is requesting to speak with Dr. Senaida LangeB's CMA. Pt stated that she went to pick up mirabegron ER (MYRBETRIQ) 25 MG TB24 tablet but it was going to cost over 100$ out of pocket after her insurance paid what they cover for the medication. Pt stated that she can't afford that and wanted to know if there was a cheaper medication similar to mirabegron ER (MYRBETRIQ) 25 MG TB24 tablet or what she can try. Please advise. Thanks TNP

## 2017-09-13 NOTE — Assessment & Plan Note (Signed)
Did not tolerate anticholinergic side effects of oxybutynin This is been discontinued Try Myrbetriq instead

## 2017-09-14 NOTE — Telephone Encounter (Signed)
Pt declines urology referral right now. Will call back if she changes her mind about this.

## 2017-10-30 ENCOUNTER — Ambulatory Visit (INDEPENDENT_AMBULATORY_CARE_PROVIDER_SITE_OTHER): Payer: Medicare Other | Admitting: Family Medicine

## 2017-10-30 ENCOUNTER — Encounter: Payer: Self-pay | Admitting: Family Medicine

## 2017-10-30 VITALS — BP 122/72 | HR 76 | Temp 98.1°F | Wt 142.2 lb

## 2017-10-30 DIAGNOSIS — E559 Vitamin D deficiency, unspecified: Secondary | ICD-10-CM

## 2017-10-30 DIAGNOSIS — N183 Chronic kidney disease, stage 3 unspecified: Secondary | ICD-10-CM

## 2017-10-30 DIAGNOSIS — I10 Essential (primary) hypertension: Secondary | ICD-10-CM | POA: Diagnosis not present

## 2017-10-30 DIAGNOSIS — E7800 Pure hypercholesterolemia, unspecified: Secondary | ICD-10-CM | POA: Diagnosis not present

## 2017-10-30 NOTE — Progress Notes (Signed)
Patient: Gina [name] Female    DOB: [birth year]   77 y.o.   MRN: [id number] Visit Date: 10/31/2017  Today's Provider: Shirlee Latch, MD   Chief Complaint  Patient presents with  . Hypertension   Subjective:    HPI  Hypertension, follow-up:  BP Readings from Last 3 Encounters:  10/30/17 122/72  09/12/17 (!) 170/84  08/08/17 (!) 174/98    She was last seen for hypertension 6 weeks ago.  BP at that visit was 170/84. Management changes since that visit include patient advised to continue her amlodipine, metoprolol, losartan at current doses, and encouraged her to bring her home blood pressure cuff, log, and medications to her next visit  . Home BP cuff reading in office today is 175/75 and 158/69 She reports good compliance with treatment. She is not having side effects.  She is not exercising. She is adherent to low salt diet.   Outside blood pressures are being checked at home. She is experiencing none.  Patient denies chest pain, chest pressure/discomfort, claudication, dyspnea, exertional chest pressure/discomfort, fatigue, irregular heart beat, lower extremity edema, near-syncope, orthopnea, palpitations, paroxysmal nocturnal dyspnea, syncope and tachypnea.   Cardiovascular risk factors include advanced age (older than 30 for men, 14 for women), dyslipidemia and hypertension.  Use of agents associated with hypertension: none.     Weight trend: stable Wt Readings from Last 3 Encounters:  10/30/17 142 lb 3.2 oz (64.5 kg)  09/12/17 138 lb (62.6 kg)  08/08/17 138 lb (62.6 kg)   ------------------------------------------------------------------------  She states that she is taking her atorvastatin 40 mg daily with good compliance.  She denies any myalgias or side effects.  Is time to recheck her cholesterol.  Allergies  Allergen Reactions  . Hydrochlorothiazide   . Chlorthalidone Nausea Only  . Lisinopril Cough  . Penicillins Rash     Current Outpatient  Medications:  .  acetaminophen (TYLENOL) 325 MG tablet, Take by mouth every 6 (six) hours as needed for pain., Disp: , Rfl:  .  amLODipine (NORVASC) 5 MG tablet, Take 1 tablet (5 mg total) by mouth daily., Disp: 90 tablet, Rfl: 2 .  aspirin 325 MG tablet, Take 325 mg by mouth daily., Disp: , Rfl:  .  cetirizine (ZYRTEC) 10 MG tablet, Take 10 mg by mouth daily. Reported on 08/10/2015, Disp: , Rfl:  .  cholecalciferol (VITAMIN D) 1000 units tablet, Take 1,000 Units by mouth daily., Disp: , Rfl:  .  losartan (COZAAR) 50 MG tablet, Take 1 tablet (50 mg total) by mouth daily., Disp: 30 tablet, Rfl: 3 .  metoprolol (LOPRESSOR) 100 MG tablet, Take 100 mg by mouth 2 (two) times daily., Disp: , Rfl:  .  mirabegron ER (MYRBETRIQ) 25 MG TB24 tablet, Take 1 tablet (25 mg total) by mouth daily., Disp: 30 tablet, Rfl: 3 .  ranitidine (ZANTAC) 150 MG tablet, Take 150 mg by mouth daily. , Disp: , Rfl:  .  atorvastatin (LIPITOR) 40 MG tablet, Take 1 tablet (40 mg total) by mouth daily. (Patient not taking: Reported on 01/16/2017), Disp: 90 tablet, Rfl: 3  Review of Systems  Constitutional: Negative.   Respiratory: Negative.   Cardiovascular: Negative.   Genitourinary: Negative.   Neurological: Negative.   Psychiatric/Behavioral: Negative.     Social History   Tobacco Use  . Smoking status: Former Smoker    Packs/day: 0.50    Years: 5.00    Pack years: 2.50    Last attempt to quit: 01/23/1970  Years since quitting: 47.8  . Smokeless tobacco: Never Used  Substance Use Topics  . Alcohol use: Yes    Comment: rare - 1 drink   Objective:   BP 122/72 (BP Location: Right Arm, Patient Position: Sitting, Cuff Size: Normal)   Pulse 76   Temp 98.1 F (36.7 C) (Oral)   Wt 142 lb 3.2 oz (64.5 kg)   SpO2 97%   BMI 24.41 kg/m  Vitals:   10/30/17 1604  BP: 122/72  Pulse: 76  Temp: 98.1 F (36.7 C)  TempSrc: Oral  SpO2: 97%  Weight: 142 lb 3.2 oz (64.5 kg)     Physical Exam  Constitutional:  She is oriented to person, place, and time. She appears well-developed and well-nourished. No distress.  HENT:  Head: Normocephalic and atraumatic.  Eyes: Conjunctivae are normal. No scleral icterus.  Neck: Neck supple. No thyromegaly present.  Cardiovascular: Normal rate, regular rhythm, normal heart sounds and intact distal pulses.  No murmur heard. Pulmonary/Chest: Effort normal and breath sounds normal. No respiratory distress. She has no wheezes. She has no rales.  Musculoskeletal: She exhibits no edema.  Lymphadenopathy:    She has no cervical adenopathy.  Neurological: She is alert and oriented to person, place, and time.  Skin: Skin is warm and dry. Capillary refill takes less than 2 seconds. No rash noted.  Psychiatric: She has a normal mood and affect. Her behavior is normal.  Vitals reviewed.      Assessment & Plan:   Problem List Items Addressed This Visit      Cardiovascular and Mediastinum   Hypertension, essential - Primary    Well-controlled Compliance with her medications is been an issue in the past, but she does seem to be taking her medications currently Discussed that her home blood pressure cuff does not seem to be well calibrated and is likely not accurate advised her to get a new one if she wants to continue checking her blood pressures Continue amlodipine, metoprolol, losartan at current doses Follow-up in 3 months Check CMP      Relevant Orders   Comprehensive metabolic panel     Genitourinary   CKD (chronic kidney disease) stage 3, GFR 30-59 ml/min (HCC)    Recheck CMP Avoid nephrotoxic medications      Relevant Orders   Comprehensive metabolic panel   CBC w/Diff/Platelet     Other   Hypercholesteremia    Patient states that she is taking her statin, but I am skeptical about this She had not yet picked it up from the pharmacy at her last visit Again encouraged her that given history of stroke, she needs to take this for secondary prevention  to help decrease her stroke risk Recheck lipid panel and CMP      Relevant Orders   Lipid panel   Comprehensive metabolic panel   Avitaminosis D    Recheck vitamin D level      Relevant Orders   CBC w/Diff/Platelet   VITAMIN D 25 Hydroxy (Vit-D Deficiency, Fractures)       Return in about 3 months (around 01/30/2018) for chronic disease f/u.   The entirety of the information documented in the History of Present Illness, Review of Systems and Physical Exam were personally obtained by me. Portions of this information were initially documented by Presley Raddle and Dara Lords, CMA and reviewed by me for thoroughness and accuracy.    Erasmo Downer, MD, MPH [facility] 10/31/2017 9:33 AM

## 2017-10-30 NOTE — Patient Instructions (Signed)
Get labs drawn after 11/04/2017  Hypertension Hypertension, commonly called high blood pressure, is when the force of blood pumping through the arteries is too strong. The arteries are the blood vessels that carry blood from the heart throughout the body. Hypertension forces the heart to work harder to pump blood and may cause arteries to become narrow or stiff. Having untreated or uncontrolled hypertension can cause heart attacks, strokes, kidney disease, and other problems. A blood pressure reading consists of a higher number over a lower number. Ideally, your blood pressure should be below 120/80. The first ("top") number is called the systolic pressure. It is a measure of the pressure in your arteries as your heart beats. The second ("bottom") number is called the diastolic pressure. It is a measure of the pressure in your arteries as the heart relaxes. What are the causes? The cause of this condition is not known. What increases the risk? Some risk factors for high blood pressure are under your control. Others are not. Factors you can change  Smoking.  Having type 2 diabetes mellitus, high cholesterol, or both.  Not getting enough exercise or physical activity.  Being overweight.  Having too much fat, sugar, calories, or salt (sodium) in your diet.  Drinking too much alcohol. Factors that are difficult or impossible to change  Having chronic kidney disease.  Having a family history of high blood pressure.  Age. Risk increases with age.  Race. You may be at higher risk if you are African-American.  Gender. Men are at higher risk than women before age 22. After age 76, women are at higher risk than men.  Having obstructive sleep apnea.  Stress. What are the signs or symptoms? Extremely high blood pressure (hypertensive crisis) may cause:  Headache.  Anxiety.  Shortness of breath.  Nosebleed.  Nausea and vomiting.  Severe chest pain.  Jerky movements you cannot  control (seizures).  How is this diagnosed? This condition is diagnosed by measuring your blood pressure while you are seated, with your arm resting on a surface. The cuff of the blood pressure monitor will be placed directly against the skin of your upper arm at the level of your heart. It should be measured at least twice using the same arm. Certain conditions can cause a difference in blood pressure between your right and left arms. Certain factors can cause blood pressure readings to be lower or higher than normal (elevated) for a short period of time:  When your blood pressure is higher when you are in a health care provider's office than when you are at home, this is called white coat hypertension. Most people with this condition do not need medicines.  When your blood pressure is higher at home than when you are in a health care provider's office, this is called masked hypertension. Most people with this condition may need medicines to control blood pressure.  If you have a high blood pressure reading during one visit or you have normal blood pressure with other risk factors:  You may be asked to return on a different day to have your blood pressure checked again.  You may be asked to monitor your blood pressure at home for 1 week or longer.  If you are diagnosed with hypertension, you may have other blood or imaging tests to help your health care provider understand your overall risk for other conditions. How is this treated? This condition is treated by making healthy lifestyle changes, such as eating healthy foods, exercising more,  and reducing your alcohol intake. Your health care provider may prescribe medicine if lifestyle changes are not enough to get your blood pressure under control, and if:  Your systolic blood pressure is above 130.  Your diastolic blood pressure is above 80.  Your personal target blood pressure may vary depending on your medical conditions, your age, and  other factors. Follow these instructions at home: Eating and drinking  Eat a diet that is high in fiber and potassium, and low in sodium, added sugar, and fat. An example eating plan is called the DASH (Dietary Approaches to Stop Hypertension) diet. To eat this way: ? Eat plenty of fresh fruits and vegetables. Try to fill half of your plate at each meal with fruits and vegetables. ? Eat whole grains, such as whole wheat pasta, [place]man rice, or whole grain bread. Fill about one quarter of your plate with whole grains. ? Eat or drink low-fat dairy products, such as skim milk or low-fat yogurt. ? Avoid fatty cuts of meat, processed or cured meats, and poultry with skin. Fill about one quarter of your plate with lean proteins, such as fish, chicken without skin, beans, eggs, and tofu. ? Avoid premade and processed foods. These tend to be higher in sodium, added sugar, and fat.  Reduce your daily sodium intake. Most people with hypertension should eat less than 1,500 mg of sodium a day.  Limit alcohol intake to no more than 1 drink a day for nonpregnant women and 2 drinks a day for men. One drink equals 12 oz of beer, 5 oz of wine, or 1 oz of hard liquor. Lifestyle  Work with your health care provider to maintain a healthy body weight or to lose weight. Ask what an ideal weight is for you.  Get at least 30 minutes of exercise that causes your heart to beat faster (aerobic exercise) most days of the week. Activities may include walking, swimming, or biking.  Include exercise to strengthen your muscles (resistance exercise), such as pilates or lifting weights, as part of your weekly exercise routine. Try to do these types of exercises for 30 minutes at least 3 days a week.  Do not use any products that contain nicotine or tobacco, such as cigarettes and e-cigarettes. If you need help quitting, ask your health care provider.  Monitor your blood pressure at home as told by your health care  provider.  Keep all follow-up visits as told by your health care provider. This is important. Medicines  Take over-the-counter and prescription medicines only as told by your health care provider. Follow directions carefully. Blood pressure medicines must be taken as prescribed.  Do not skip doses of blood pressure medicine. Doing this puts you at risk for problems and can make the medicine less effective.  Ask your health care provider about side effects or reactions to medicines that you should watch for. Contact a health care provider if:  You think you are having a reaction to a medicine you are taking.  You have headaches that keep coming back (recurring).  You feel dizzy.  You have swelling in your ankles.  You have trouble with your vision. Get help right away if:  You develop a severe headache or confusion.  You have unusual weakness or numbness.  You feel faint.  You have severe pain in your chest or abdomen.  You vomit repeatedly.  You have trouble breathing. Summary  Hypertension is when the force of blood pumping through your arteries is too  strong. If this condition is not controlled, it may put you at risk for serious complications.  Your personal target blood pressure may vary depending on your medical conditions, your age, and other factors. For most people, a normal blood pressure is less than 120/80.  Hypertension is treated with lifestyle changes, medicines, or a combination of both. Lifestyle changes include weight loss, eating a healthy, low-sodium diet, exercising more, and limiting alcohol. This information is not intended to replace advice given to you by your health care provider. Make sure you discuss any questions you have with your health care provider. Document Released: 01/10/2005 Document Revised: 12/09/2015 Document Reviewed: 12/09/2015 Elsevier Interactive Patient Education  [employer].

## 2017-10-31 NOTE — Assessment & Plan Note (Signed)
Patient states that she is taking her statin, but I am skeptical about this She had not yet picked it up from the pharmacy at her last visit Again encouraged her that given history of stroke, she needs to take this for secondary prevention to help decrease her stroke risk Recheck lipid panel and CMP

## 2017-10-31 NOTE — Assessment & Plan Note (Signed)
Recheck CMP Avoid nephrotoxic medications

## 2017-10-31 NOTE — Assessment & Plan Note (Signed)
Well-controlled Compliance with her medications is been an issue in the past, but she does seem to be taking her medications currently Discussed that her home blood pressure cuff does not seem to be well calibrated and is likely not accurate advised her to get a new one if she wants to continue checking her blood pressures Continue amlodipine, metoprolol, losartan at current doses Follow-up in 3 months Check CMP

## 2017-10-31 NOTE — Assessment & Plan Note (Signed)
Recheck vitamin D level 

## 2017-11-08 DIAGNOSIS — I10 Essential (primary) hypertension: Secondary | ICD-10-CM | POA: Diagnosis not present

## 2017-11-08 DIAGNOSIS — E7800 Pure hypercholesterolemia, unspecified: Secondary | ICD-10-CM | POA: Diagnosis not present

## 2017-11-08 DIAGNOSIS — N183 Chronic kidney disease, stage 3 (moderate): Secondary | ICD-10-CM | POA: Diagnosis not present

## 2017-11-08 DIAGNOSIS — E559 Vitamin D deficiency, unspecified: Secondary | ICD-10-CM | POA: Diagnosis not present

## 2017-11-09 LAB — LIPID PANEL
CHOLESTEROL TOTAL: 194 mg/dL (ref 100–199)
Chol/HDL Ratio: 5 ratio — ABNORMAL HIGH (ref 0.0–4.4)
HDL: 39 mg/dL — ABNORMAL LOW (ref >39)
LDL Calculated: 109 mg/dL — ABNORMAL HIGH (ref 0–99)
TRIGLYCERIDES: 231 mg/dL — AB (ref 0–149)
VLDL Cholesterol Cal: 46 mg/dL — ABNORMAL HIGH (ref 5–40)

## 2017-11-09 LAB — COMPREHENSIVE METABOLIC PANEL
A/G RATIO: 1.3 (ref 1.2–2.2)
ALBUMIN: 4.2 g/dL (ref 3.5–4.8)
ALT: 22 IU/L (ref 0–32)
AST: 18 IU/L (ref 0–40)
Alkaline Phosphatase: 73 IU/L (ref 39–117)
BUN / CREAT RATIO: 22 (ref 12–28)
BUN: 21 mg/dL (ref 8–27)
Bilirubin Total: 0.4 mg/dL (ref 0.0–1.2)
CALCIUM: 9.7 mg/dL (ref 8.7–10.3)
CO2: 22 mmol/L (ref 20–29)
CREATININE: 0.96 mg/dL (ref 0.57–1.00)
Chloride: 102 mmol/L (ref 96–106)
GFR, EST AFRICAN AMERICAN: 66 mL/min/{1.73_m2} (ref >59)
GFR, EST NON AFRICAN AMERICAN: 57 mL/min/{1.73_m2} — AB (ref >59)
GLOBULIN, TOTAL: 3.3 g/dL (ref 1.5–4.5)
Glucose: 101 mg/dL — ABNORMAL HIGH (ref 65–99)
POTASSIUM: 4 mmol/L (ref 3.5–5.2)
SODIUM: 141 mmol/L (ref 134–144)
TOTAL PROTEIN: 7.5 g/dL (ref 6.0–8.5)

## 2017-11-09 LAB — VITAMIN D 25 HYDROXY (VIT D DEFICIENCY, FRACTURES): Vit D, 25-Hydroxy: 30.5 ng/mL (ref 30.0–100.0)

## 2017-11-09 LAB — CBC WITH DIFFERENTIAL/PLATELET
BASOS: 1 %
Basophils Absolute: 0.1 10*3/uL (ref 0.0–0.2)
EOS (ABSOLUTE): 0.2 10*3/uL (ref 0.0–0.4)
EOS: 3 %
HEMATOCRIT: 40.5 % (ref 34.0–46.6)
Hemoglobin: 13.7 g/dL (ref 11.1–15.9)
IMMATURE GRANS (ABS): 0 10*3/uL (ref 0.0–0.1)
IMMATURE GRANULOCYTES: 0 %
Lymphocytes Absolute: 1.6 10*3/uL (ref 0.7–3.1)
Lymphs: 23 %
MCH: 29.8 pg (ref 26.6–33.0)
MCHC: 33.8 g/dL (ref 31.5–35.7)
MCV: 88 fL (ref 79–97)
MONOS ABS: 0.8 10*3/uL (ref 0.1–0.9)
Monocytes: 11 %
NEUTROS ABS: 4.3 10*3/uL (ref 1.4–7.0)
NEUTROS PCT: 62 %
Platelets: 179 10*3/uL (ref 150–450)
RBC: 4.59 x10E6/uL (ref 3.77–5.28)
RDW: 12.8 % (ref 12.3–15.4)
WBC: 7 10*3/uL (ref 3.4–10.8)

## 2017-11-13 ENCOUNTER — Telehealth: Payer: Self-pay

## 2017-11-13 DIAGNOSIS — G894 Chronic pain syndrome: Secondary | ICD-10-CM

## 2017-11-13 NOTE — Telephone Encounter (Signed)
It appears the patient has been referred to Dr. Laban Emperor for pain management previously and no showed his appointments twice.  We will try to refer her again to a pain management specialist.  She really needs to take the atorvastatin.  This is not going to make a change in her chronic pain that is already present.  Since she has history of stroke, is very important for her to control her cholesterol.  This will help decrease her risk of stroke, heart attack, and death in the future  Bacigalupo, Marzella Schlein, MD, MPH [facility] 11/13/2017 4:38 PM

## 2017-11-13 NOTE — Telephone Encounter (Signed)
-----   Message from Erasmo Downer, MD sent at 11/13/2017  8:55 AM EDT ----- Cholesterol is unchanged.  Is patient taking her atorvastatin?  Normal kidney function, liver function, electrolytes, blood counts, vitamin D level  Bacigalupo, Marzella Schlein, MD, MPH [facility] 11/13/2017 8:55 AM

## 2017-11-13 NOTE — Telephone Encounter (Signed)
Patient was advised and states that she is not taking her medication like she should because she is scared of the Atorvastatin due to it might make her pain worse. Would like to know if she could referral to a doctor for her leg pain that is becoming more painful.

## 2017-11-14 NOTE — Telephone Encounter (Signed)
Patient advised. She agrees to proceed with pain management. Referral is placed.

## 2017-12-04 ENCOUNTER — Ambulatory Visit: Payer: Self-pay | Admitting: Family Medicine

## 2017-12-12 ENCOUNTER — Ambulatory Visit
Payer: Medicare Other | Attending: Student in an Organized Health Care Education/Training Program | Admitting: Student in an Organized Health Care Education/Training Program

## 2017-12-12 ENCOUNTER — Other Ambulatory Visit: Payer: Self-pay

## 2017-12-12 ENCOUNTER — Encounter: Payer: Self-pay | Admitting: Student in an Organized Health Care Education/Training Program

## 2017-12-12 VITALS — BP 149/78 | HR 70 | Temp 98.2°F | Resp 18 | Ht 64.0 in | Wt 140.0 lb

## 2017-12-12 DIAGNOSIS — M47816 Spondylosis without myelopathy or radiculopathy, lumbar region: Secondary | ICD-10-CM | POA: Insufficient documentation

## 2017-12-12 DIAGNOSIS — I129 Hypertensive chronic kidney disease with stage 1 through stage 4 chronic kidney disease, or unspecified chronic kidney disease: Secondary | ICD-10-CM | POA: Insufficient documentation

## 2017-12-12 DIAGNOSIS — M533 Sacrococcygeal disorders, not elsewhere classified: Secondary | ICD-10-CM | POA: Diagnosis not present

## 2017-12-12 DIAGNOSIS — K219 Gastro-esophageal reflux disease without esophagitis: Secondary | ICD-10-CM | POA: Insufficient documentation

## 2017-12-12 DIAGNOSIS — M25551 Pain in right hip: Secondary | ICD-10-CM | POA: Diagnosis present

## 2017-12-12 DIAGNOSIS — M5126 Other intervertebral disc displacement, lumbar region: Secondary | ICD-10-CM | POA: Diagnosis not present

## 2017-12-12 DIAGNOSIS — J309 Allergic rhinitis, unspecified: Secondary | ICD-10-CM | POA: Insufficient documentation

## 2017-12-12 DIAGNOSIS — Z7982 Long term (current) use of aspirin: Secondary | ICD-10-CM | POA: Insufficient documentation

## 2017-12-12 DIAGNOSIS — M47818 Spondylosis without myelopathy or radiculopathy, sacral and sacrococcygeal region: Secondary | ICD-10-CM

## 2017-12-12 DIAGNOSIS — E559 Vitamin D deficiency, unspecified: Secondary | ICD-10-CM | POA: Insufficient documentation

## 2017-12-12 DIAGNOSIS — M5127 Other intervertebral disc displacement, lumbosacral region: Secondary | ICD-10-CM | POA: Insufficient documentation

## 2017-12-12 DIAGNOSIS — Z8673 Personal history of transient ischemic attack (TIA), and cerebral infarction without residual deficits: Secondary | ICD-10-CM | POA: Insufficient documentation

## 2017-12-12 DIAGNOSIS — G5790 Unspecified mononeuropathy of unspecified lower limb: Secondary | ICD-10-CM | POA: Diagnosis not present

## 2017-12-12 DIAGNOSIS — G894 Chronic pain syndrome: Secondary | ICD-10-CM | POA: Diagnosis not present

## 2017-12-12 DIAGNOSIS — N183 Chronic kidney disease, stage 3 (moderate): Secondary | ICD-10-CM | POA: Insufficient documentation

## 2017-12-12 DIAGNOSIS — Z96642 Presence of left artificial hip joint: Secondary | ICD-10-CM | POA: Diagnosis not present

## 2017-12-12 DIAGNOSIS — E7800 Pure hypercholesterolemia, unspecified: Secondary | ICD-10-CM | POA: Insufficient documentation

## 2017-12-12 DIAGNOSIS — M2578 Osteophyte, vertebrae: Secondary | ICD-10-CM | POA: Insufficient documentation

## 2017-12-12 DIAGNOSIS — Z79899 Other long term (current) drug therapy: Secondary | ICD-10-CM | POA: Diagnosis not present

## 2017-12-12 DIAGNOSIS — I700 Atherosclerosis of aorta: Secondary | ICD-10-CM | POA: Insufficient documentation

## 2017-12-12 DIAGNOSIS — N3281 Overactive bladder: Secondary | ICD-10-CM | POA: Insufficient documentation

## 2017-12-12 DIAGNOSIS — M25552 Pain in left hip: Secondary | ICD-10-CM | POA: Diagnosis present

## 2017-12-12 NOTE — Patient Instructions (Addendum)
Please stop ASA 325 7 days prior to scheduled procedure and take 81 mg instead  Moderate Conscious Sedation, Adult Sedation is the use of medicines to promote relaxation and relieve discomfort and anxiety. Moderate conscious sedation is a type of sedation. Under moderate conscious sedation, you are less alert than normal, but you are still able to respond to instructions, touch, or both. Moderate conscious sedation is used during short medical and dental procedures. It is milder than deep sedation, which is a type of sedation under which you cannot be easily woken up. It is also milder than general anesthesia, which is the use of medicines to make you unconscious. Moderate conscious sedation allows you to return to your regular activities sooner. Tell a health care provider about:  Any allergies you have.  All medicines you are taking, including vitamins, herbs, eye drops, creams, and over-the-counter medicines.  Use of steroids (by mouth or creams).  Any problems you or family members have had with sedatives and anesthetic medicines.  Any blood disorders you have.  Any surgeries you have had.  Any medical conditions you have, such as sleep apnea.  Whether you are pregnant or may be pregnant.  Any use of cigarettes, alcohol, marijuana, or street drugs. What are the risks? Generally, this is a safe procedure. However, problems may occur, including:  Getting too much medicine (oversedation).  Nausea.  Allergic reaction to medicines.  Trouble breathing. If this happens, a breathing tube may be used to help with breathing. It will be removed when you are awake and breathing on your own.  Heart trouble.  Lung trouble.  What happens before the procedure? Staying hydrated Follow instructions from your health care provider about hydration, which may include:  Up to 2 hours before the procedure - you may continue to drink clear liquids, such as water, clear fruit juice, black  coffee, and plain tea.  Eating and drinking restrictions Follow instructions from your health care provider about eating and drinking, which may include:  8 hours before the procedure - stop eating heavy meals or foods such as meat, fried foods, or fatty foods.  6 hours before the procedure - stop eating light meals or foods, such as toast or cereal.  6 hours before the procedure - stop drinking milk or drinks that contain milk.  2 hours before the procedure - stop drinking clear liquids.  Medicine  Ask your health care provider about:  Changing or stopping your regular medicines. This is especially important if you are taking diabetes medicines or blood thinners.  Taking medicines such as aspirin and ibuprofen. These medicines can thin your blood. Do not take these medicines before your procedure if your health care provider instructs you not to.  Tests and exams  You will have a physical exam.  You may have blood tests done to show: ? How well your kidneys and liver are working. ? How well your blood can clot. General instructions  Plan to have someone take you home from the hospital or clinic.  If you will be going home right after the procedure, plan to have someone with you for 24 hours. What happens during the procedure?  An IV tube will be inserted into one of your veins.  Medicine to help you relax (sedative) will be given through the IV tube.  The medical or dental procedure will be performed. What happens after the procedure?  Your blood pressure, heart rate, breathing rate, and blood oxygen level will be monitored often  until the medicines you were given have worn off.  Do not drive for 24 hours. This information is not intended to replace advice given to you by your health care provider. Make sure you discuss any questions you have with your health care provider. Document Released: 10/05/2000 Document Revised: 06/16/2015 Document Reviewed:  05/02/2015 Elsevier Interactive Patient Education  2018 [place]  What are the risk, side effects and possible complications? Generally speaking, most procedures are safe.  However, with any procedure there are risks, side effects, and the possibility of complications.  The risks and complications are dependent upon the sites that are lesioned, or the type of nerve block to be performed.  The closer the procedure is to the spine, the more serious the risks are.  Great care is taken when placing the radio frequency needles, block needles or lesioning probes, but sometimes complications can occur. 1. Infection: Any time there is an injection through the skin, there is a risk of infection.  This is why sterile conditions are used for these blocks.  There are four possible types of infection. 1. Localized skin infection. 2. Central Nervous System Infection-This can be in the form of Meningitis, which can be deadly. 3. Epidural Infections-This can be in the form of an epidural abscess, which can cause pressure inside of the spine, causing compression of the spinal cord with subsequent paralysis. This would require an emergency surgery to decompress, and there are no guarantees that the patient would recover from the paralysis. 4. Discitis-This is an infection of the intervertebral discs.  It occurs in about 1% of discography procedures.  It is difficult to treat and it may lead to surgery.        2. Pain: the needles have to go through skin and soft tissues, will cause soreness.       3. Damage to internal structures:  The nerves to be lesioned may be near blood vessels or    other nerves which can be potentially damaged.       4. Bleeding: Bleeding is more common if the patient is taking blood thinners such as  aspirin, Coumadin, Ticiid, Plavix, etc., or if he/she have some genetic predisposition  such as hemophilia. Bleeding into the spinal canal can cause  compression of the spinal  cord with subsequent paralysis.  This would require an emergency surgery to  decompress and there are no guarantees that the patient would recover from the  paralysis.       5. Pneumothorax:  Puncturing of a lung is a possibility, every time a needle is introduced in  the area of the chest or upper back.  Pneumothorax refers to free air around the  collapsed lung(s), inside of the thoracic cavity (chest cavity).  Another two possible  complications related to a similar event would include: Hemothorax and Chylothorax.   These are variations of the Pneumothorax, where instead of air around the collapsed  lung(s), you may have blood or chyle, respectively.       6. Spinal headaches: They may occur with any procedures in the area of the spine.       7. Persistent CSF (Cerebro-Spinal Fluid) leakage: This is a rare problem, but may occur  with prolonged intrathecal or epidural catheters either due to the formation of a fistulous  track or a dural tear.       8. Nerve damage: By working so close to the spinal cord, there is always a possibility  of  nerve damage, which could be as serious as a permanent spinal cord injury with  paralysis.       9. Death:  Although rare, severe deadly allergic reactions known as "Anaphylactic  reaction" can occur to any of the medications used.      10. Worsening of the symptoms:  We can always make thing worse.  What are the chances of something like this happening? Chances of any of this occuring are extremely low.  By statistics, you have more of a chance of getting killed in a motor vehicle accident: while driving to the hospital than any of the above occurring .  Nevertheless, you should be aware that they are possibilities.  In general, it is similar to taking a shower.  Everybody knows that you can slip, hit your head and get killed.  Does that mean that you should not shower again?  Nevertheless always keep in mind that statistics do not mean  anything if you happen to be on the wrong side of them.  Even if a procedure has a 1 (one) in a 1,000,000 (million) chance of going wrong, it you happen to be that one..Also, keep in mind that by statistics, you have more of a chance of having something go wrong when taking medications.  Who should not have this procedure? If you are on a blood thinning medication (e.g. Coumadin, Plavix, see list of "Blood Thinners"), or if you have an active infection going on, you should not have the procedure.  If you are taking any blood thinners, please inform your physician.  How should I prepare for this procedure?  Do not eat or drink anything at least six hours prior to the procedure.  Bring a driver with you .  It cannot be a taxi.  Come accompanied by an adult that can drive you back, and that is strong enough to help you if your legs get weak or numb from the local anesthetic.  Take all of your medicines the morning of the procedure with just enough water to swallow them.  If you have diabetes, make sure that you are scheduled to have your procedure done first thing in the morning, whenever possible.  If you have diabetes, take only half of your insulin dose and notify our nurse that you have done so as soon as you arrive at the clinic.  If you are diabetic, but only take blood sugar pills (oral hypoglycemic), then do not take them on the morning of your procedure.  You may take them after you have had the procedure.  Do not take aspirin or any aspirin-containing medications, at least eleven (11) days prior to the procedure.  They may prolong bleeding.  Wear loose fitting clothing that may be easy to take off and that you would not mind if it got stained with Betadine or blood.  Do not wear any jewelry or perfume  Remove any nail coloring.  It will interfere with some of our monitoring equipment.  NOTE: Remember that this is not meant to be interpreted as a complete list of all possible  complications.  Unforeseen problems may occur.  BLOOD THINNERS The following drugs contain aspirin or other products, which can cause increased bleeding during surgery and should not be taken for 2 weeks prior to and 1 week after surgery.  If you should need take something for relief of minor pain, you may take acetaminophen which is found in Tylenol,m Datril, Anacin-3 and Panadol. It is  not blood thinner. The products listed below are.  Do not take any of the products listed below in addition to any listed on your instruction sheet.  A.P.C or A.P.C with Codeine Codeine Phosphate Capsules #3 Ibuprofen Ridaura  ABC compound Congesprin Imuran rimadil  Advil Cope Indocin Robaxisal  Alka-Seltzer Effervescent Pain Reliever and Antacid Coricidin or Coricidin-D  Indomethacin Rufen  Alka-Seltzer plus Cold Medicine Cosprin Ketoprofen S-A-C Tablets  Anacin Analgesic Tablets or Capsules Coumadin Korlgesic Salflex  Anacin Extra Strength Analgesic tablets or capsules CP-2 Tablets Lanoril Salicylate  Anaprox Cuprimine Capsules Levenox Salocol  Anexsia-D Dalteparin Magan Salsalate  Anodynos Darvon compound Magnesium Salicylate Sine-off  Ansaid Dasin Capsules Magsal Sodium Salicylate  Anturane Depen Capsules Marnal Soma  APF Arthritis pain formula Dewitt's Pills Measurin Stanback  Argesic Dia-Gesic Meclofenamic Sulfinpyrazone  Arthritis Bayer Timed Release Aspirin Diclofenac Meclomen Sulindac  Arthritis pain formula Anacin Dicumarol Medipren Supac  Analgesic (Safety coated) Arthralgen Diffunasal Mefanamic Suprofen  Arthritis Strength Bufferin Dihydrocodeine Mepro Compound Suprol  Arthropan liquid Dopirydamole Methcarbomol with Aspirin Synalgos  ASA tablets/Enseals Disalcid Micrainin Tagament  Ascriptin Doan's Midol Talwin  Ascriptin A/D Dolene Mobidin Tanderil  Ascriptin Extra Strength Dolobid Moblgesic Ticlid  Ascriptin with Codeine Doloprin or Doloprin with Codeine Momentum Tolectin  Asperbuf Duoprin  Mono-gesic Trendar  Aspergum Duradyne Motrin or Motrin IB Triminicin  Aspirin plain, buffered or enteric coated Durasal Myochrisine Trigesic  Aspirin Suppositories Easprin Nalfon Trillsate  Aspirin with Codeine Ecotrin Regular or Extra Strength Naprosyn Uracel  Atromid-S Efficin Naproxen Ursinus  Auranofin Capsules Elmiron Neocylate Vanquish  Axotal Emagrin Norgesic Verin  Azathioprine Empirin or Empirin with Codeine Normiflo Vitamin E  Azolid Emprazil Nuprin Voltaren  Bayer Aspirin plain, buffered or children's or timed BC Tablets or powders Encaprin Orgaran Warfarin Sodium  Buff-a-Comp Enoxaparin Orudis Zorpin  Buff-a-Comp with Codeine Equegesic Os-Cal-Gesic   Buffaprin Excedrin plain, buffered or Extra Strength Oxalid   Bufferin Arthritis Strength Feldene Oxphenbutazone   Bufferin plain or Extra Strength Feldene Capsules Oxycodone with Aspirin   Bufferin with Codeine Fenoprofen Fenoprofen Pabalate or Pabalate-SF   Buffets II Flogesic Panagesic   Buffinol plain or Extra Strength Florinal or Florinal with Codeine Panwarfarin   Buf-Tabs Flurbiprofen Penicillamine   Butalbital Compound Four-way cold tablets Penicillin   Butazolidin Fragmin Pepto-Bismol   Carbenicillin Geminisyn Percodan   Carna Arthritis Reliever Geopen Persantine   Carprofen Gold's salt Persistin   Chloramphenicol Goody's Phenylbutazone   Chloromycetin Haltrain Piroxlcam   Clmetidine heparin Plaquenil   Cllnoril Hyco-pap Ponstel   Clofibrate Hydroxy chloroquine Propoxyphen         Before stopping any of these medications, be sure to consult the physician who ordered them.  Some, such as Coumadin (Warfarin) are ordered to prevent or treat serious conditions such as "deep thrombosis", "pumonary embolisms", and other heart problems.  The amount of time that you may need off of the medication may also vary with the medication and the reason for which you were taking it.  If you are taking any of these medications, please  make sure you notify your pain physician before you undergo any procedures.    Facet Blocks Patient Information  Description: The facets are joints in the spine between the vertebrae.  Like any joints in the body, facets can become irritated and painful.  Arthritis can also effect the facets.  By injecting steroids and local anesthetic in and around these joints, we can temporarily block the nerve supply to them.  Steroids act directly on irritated nerves and  tissues to reduce selling and inflammation which often leads to decreased pain.  Facet blocks may be done anywhere along the spine from the neck to the low back depending upon the location of your pain.   After numbing the skin with local anesthetic (like Novocaine), a small needle is passed onto the facet joints under x-Rhody guidance.  You may experience a sensation of pressure while this is being done.  The entire block usually lasts about 15-25 minutes.   Conditions which may be treated by facet blocks:   Low back/buttock pain  Neck/shoulder pain  Certain types of headaches  Preparation for the injection:  1. Do not eat any solid food or dairy products within 8 hours of your appointment. 2. You may drink clear liquid up to 3 hours before appointment.  Clear liquids include water, black coffee, juice or soda.  No milk or cream please. 3. You may take your regular medication, including pain medications, with a sip of water before your appointment.  Diabetics should hold regular insulin (if taken separately) and take 1/2 normal NPH dose the morning of the procedure.  Carry some sugar containing items with you to your appointment. 4. A driver must accompany you and be prepared to drive you home after your procedure. 5. Bring all your current medications with you. 6. An IV may be inserted and sedation may be given at the discretion of the physician. 7. A blood pressure cuff, EKG and other monitors will often be applied during the  procedure.  Some patients may need to have extra oxygen administered for a short period. 8. You will be asked to provide medical information, including your allergies and medications, prior to the procedure.  We must know immediately if you are taking blood thinners (like Coumadin/Warfarin) or if you are allergic to IV iodine contrast (dye).  We must know if you could possible be pregnant.  Possible side-effects:   Bleeding from needle site  Infection (rare, may require surgery)  Nerve injury (rare)  Numbness & tingling (temporary)  Difficulty urinating (rare, temporary)  Spinal headache (a headache worse with upright posture)  Light-headedness (temporary)  Pain at injection site (serveral days)  Decreased blood pressure (rare, temporary)  Weakness in arm/leg (temporary)  Pressure sensation in back/neck (temporary)   Call if you experience:   Fever/chills associated with headache or increased back/neck pain  Headache worsened by an upright position  New onset, weakness or numbness of an extremity below the injection site  Hives or difficulty breathing (go to the emergency room)  Inflammation or drainage at the injection site(s)  Severe back/neck pain greater than usual  New symptoms which are concerning to you  Please note:  Although the local anesthetic injected can often make your back or neck feel good for several hours after the injection, the pain will likely return. It takes 3-7 days for steroids to work.  You may not notice any pain relief for at least one week.  If effective, we will often do a series of 2-3 injections spaced 3-6 weeks apart to maximally decrease your pain.  After the initial series, you may be a candidate for a more permanent nerve block of the facets.  If you have any questions, please call #[phone] [facility]

## 2017-12-12 NOTE — Progress Notes (Signed)
Safety precautions to be maintained throughout the outpatient stay will include: orient to surroundings, keep bed in low position, maintain call bell within reach at all times, provide assistance with transfer out of bed and ambulation.  

## 2017-12-12 NOTE — Progress Notes (Signed)
Patient's Name: Gina [name]  MRN: [id number]  Referring Provider: Virginia Crews, MD  DOB: 11-20-1940  PCP: Virginia Crews, MD  DOS: 12/12/2017  Note by: Gillis Santa, MD  Service setting: Ambulatory outpatient  Specialty: Interventional Pain Management  Location: [facility] (AMB) Pain Management Facility  Visit type: Initial Patient Evaluation  Patient type: New Patient   Primary Reason(s) for Visit: Encounter for initial evaluation of one or more chronic problems (new to examiner) potentially causing chronic pain, and posing a threat to normal musculoskeletal function. (Level of risk: High) CC: Hip Pain (bilateral)  HPI  Gina [name] is a 77 y.o. year old, female patient, who comes today to see [place] for the first time for an initial evaluation of her chronic pain. She has Allergic rhinitis; Appendicular ataxia; Cerebral artery occlusion with cerebral infarction ([place]); Acid reflux; History of CVA (cerebrovascular accident); Hypercholesteremia; Mononeuropathy of lower extremity; Disorder of mitral valve; Nonrheumatic tricuspid valve disorder; Adnexal mass; Avitaminosis D; Impacted cerumen of right ear; Arthritis; Hypertension, essential; Long-term use of high-risk medication; Chronic pain syndrome; CKD (chronic kidney disease) stage 3, GFR 30-59 ml/min (HCC); Dyspnea on exertion; and Overactive bladder on their problem list. Today she comes in for evaluation of her Hip Pain (bilateral)  Pain Assessment: Location: Right, Left Hip Radiating: radiates into both legs in the back to knee Onset: More than a month ago Duration: Chronic pain Quality: Dull, Aching, Constant Severity: 8 /10 (subjective, self-reported pain score)  Note: Reported level is inconsistent with clinical observations.                         When using our objective Pain Scale, levels between 6 and 10/10 are said to belong in an emergency room, as it progressively worsens from a 6/10, described as severely limiting, requiring emergency  care not usually available at an outpatient pain management facility. At a 6/10 level, communication becomes difficult and requires great effort. Assistance to reach the emergency department may be required. Facial flushing and profuse sweating along with potentially dangerous increases in heart rate and blood pressure will be evident. Effect on ADL: "IIt limits my activities" Timing: Constant Modifying factors: rest, sleep BP: (!) 149/78  HR: 70  Onset and Duration: Present longer than 3 months Cause of pain: Unknown Severity: Getting worse and NAS-11 at its worse: 8/10 Timing: Not influenced by the time of the day Aggravating Factors: Motion, Prolonged standing and Walking Alleviating Factors: Lying down and Sitting Associated Problems: Weakness Quality of Pain: Annoying Previous Examinations or Tests: The patient denies none noted Previous Treatments: The patient denies none noted  The patient comes into the clinics today for the first time for a chronic pain management evaluation.   Very pleasant 77 year old female who presents with a chief complaint of axial low back pain that radiates into bilateral buttocks and posterior thighs.  This is worse on the left side.  Patient does have a history of left hip replacement in 2013.  Otherwise she has had a history of prior TIAs which have left some sensory deficits in her left hand.  Otherwise no significant motor deficits.  She is on a full-strength aspirin.  Patient is previously tried lumbar epidural steroid injections which were not helpful.  She denies any bowel or bladder dysfunction.  Patient is tried physical therapy, NSAID trials including ibuprofen and Aleve as well as neuropathic's which resulted in sedation.  Patient wants to avoid opioid medications given that she  does have a history of falls and wants to avoid any sedative medications.  Today I took the time to provide the patient with information regarding my pain practice. The  patient was informed that my practice is divided into two sections: an interventional pain management section, as well as a completely separate and distinct medication management section. I explained that I have procedure days for my interventional therapies, and evaluation days for follow-ups and medication management. Because of the amount of documentation required during both, they are kept separated. This means that there is the possibility that she may be scheduled for a procedure on one day, and medication management the next. I have also informed her that because of staffing and facility limitations, I no longer take patients for medication management only. To illustrate the reasons for this, I gave the patient the example of surgeons, and how inappropriate it would be to refer a patient to his/her care, just to write for the post-surgical antibiotics on a surgery done by a different surgeon.   Because interventional pain management is my board-certified specialty, the patient was informed that joining my practice means that they are open to any and all interventional therapies. I made it clear that this does not mean that they will be forced to have any procedures done. What this means is that I believe interventional therapies to be essential part of the diagnosis and proper management of chronic pain conditions. Therefore, patients not interested in these interventional alternatives will be better served under the care of a different practitioner.  The patient was also made aware of my Comprehensive Pain Management Safety Guidelines where by joining my practice, they limit all of their nerve blocks and joint injections to those done by our practice, for as long as we are retained to manage their care.   Historic Controlled Substance Pharmacotherapy Review  No opioid prescriptions filled   [place] Department of public safety, offender search: Editor, commissioning Information) Non-contributory Risk Assessment  Profile: Aberrant behavior: None observed or detected today Risk factors for fatal opioid overdose: None identified today Fatal overdose hazard ratio (HR): Calculation deferred Non-fatal overdose hazard ratio (HR): Calculation deferred Risk of opioid abuse or dependence: 0.7-3.0% with doses ? 36 MME/day and 6.1-26% with doses ? 120 MME/day. Substance use disorder (SUD) risk level: See below Personal History of Substance Abuse (SUD-Substance use disorder):  Alcohol:    Illegal Drugs:    Rx Drugs:    ORT Risk Level calculation:    ORT Scoring interpretation table:  Score <3 = Low Risk for SUD  Score between 4-7 = Moderate Risk for SUD  Score >8 = High Risk for Opioid Abuse   PHQ-2 Depression Scale:  Total score: 0  PHQ-2 Scoring interpretation table: (Score and probability of major depressive disorder)  Score 0 = No depression  Score 1 = 15.4% Probability  Score 2 = 21.1% Probability  Score 3 = 38.4% Probability  Score 4 = 45.5% Probability  Score 5 = 56.4% Probability  Score 6 = 78.6% Probability   PHQ-9 Depression Scale:  Total score: 0  PHQ-9 Scoring interpretation table:  Score 0-4 = No depression  Score 5-9 = Mild depression  Score 10-14 = Moderate depression  Score 15-19 = Moderately severe depression  Score 20-27 = Severe depression (2.4 times higher risk of SUD and 2.89 times higher risk of overuse)   Pharmacologic Plan: Gina [name] indicated having a preference to stay away from opioid analgesics.  Initial impression: Gina [name] indicated having no interest in opioid therapy, at this point.  Meds   Current Outpatient Medications:  .  acetaminophen (TYLENOL) 325 MG tablet, Take by mouth every 6 (six) hours as needed for pain., Disp: , Rfl:  .  amLODipine (NORVASC) 5 MG tablet, Take 1 tablet (5 mg total) by mouth daily., Disp: 90 tablet, Rfl: 2 .  aspirin 325 MG tablet, Take 325 mg by mouth daily., Disp: , Rfl:  .  atorvastatin (LIPITOR) 40 MG tablet, Take 1  tablet (40 mg total) by mouth daily., Disp: 90 tablet, Rfl: 3 .  cetirizine (ZYRTEC) 10 MG tablet, Take 10 mg by mouth daily. Reported on 08/10/2015, Disp: , Rfl:  .  cholecalciferol (VITAMIN D) 1000 units tablet, Take 1,000 Units by mouth daily., Disp: , Rfl:  .  losartan (COZAAR) 50 MG tablet, Take 1 tablet (50 mg total) by mouth daily., Disp: 30 tablet, Rfl: 3 .  metoprolol (LOPRESSOR) 100 MG tablet, Take 100 mg by mouth 2 (two) times daily., Disp: , Rfl:  .  ranitidine (ZANTAC) 150 MG tablet, Take 150 mg by mouth daily. , Disp: , Rfl:  .  mirabegron ER (MYRBETRIQ) 25 MG TB24 tablet, Take 1 tablet (25 mg total) by mouth daily. (Patient not taking: Reported on 12/12/2017), Disp: 30 tablet, Rfl: 3  Imaging Review  C Lumbosacral Imaging: Lumbar MR wo contrast:  Results for orders placed during the hospital encounter of 11/30/15  MR LUMBAR SPINE WO CONTRAST   Narrative CLINICAL DATA:  77 year old female with low back pain. Left leg pain and numbness. Subsequent encounter.  EXAM: MRI LUMBAR SPINE WITHOUT CONTRAST  TECHNIQUE: Multiplanar, multisequence MR imaging of the lumbar spine was performed. No intravenous contrast was administered.  COMPARISON:  10/18/2014 MR.  FINDINGS: Segmentation: Last fully open disk space is labeled L5-S1. Present examination incorporates from T11-12 disc space through the lower sacrum.  Alignment:  Curvature of the lumbar spine convex right.  Vertebrae:  No acute vertebral abnormality.  Conus medullaris: Extends to the L1-2 level and appears normal.  Paraspinal and other soft tissues: Mild ectasia abdominal aorta and right common iliac artery.  Disc levels:  T11-12:  Minimal bulge.  Axial imaging not obtained.  T12-L1:  Negative.  L1-2:  Minimal bulge.  Minimal facet degenerative changes.  L2-3: Bulge greater to left. Facet degenerative changes greater on left. Mild to moderate left lateral thecal sac narrowing without compression of the  contained nerve roots.  L3-4: Prominent facet degenerative changes. Ligamentum flavum hypertrophy. Rotation and bulge greater to left. Moderate narrowing lateral aspect of thecal sac greater on left. Left foraminal/ lateral extension of bulge with slight encroachment upon but not compression of the exiting left L3 nerve root.  L4-5: Mild facet degenerative changes. Bulge with right lateral spur without nerve root compression. Mild spinal stenosis.  L5-S1: Right lateral disc/osteophyte with mild compression of the exiting right L5 nerve root in a far lateral position.  IMPRESSION: Scoliosis lumbar spine convex right. Minimal progression of superimposed degenerative changes when compared to the prior exam. Summary pertinent findings includes:  L2-3 multifactorial mild to moderate left lateral thecal sac narrowing without compression of the contained nerve roots.  L3-4 multifactorial moderate narrowing lateral aspect of thecal sac greater on left. Left foraminal/ lateral extension of bulge with slight encroachment upon but not compression of the exiting left L3 nerve root.  L4-5 mild spinal stenosis.  L5-S1 right lateral disc/osteophyte with mild compression of the exiting right L5  nerve root in a far lateral position.   Electronically Signed   By: Genia Del M.D.   On: 11/30/2015 16:33     Lumbar CT w contrast:  Results for orders placed during the hospital encounter of 02/05/16  CT LUMBAR SPINE W CONTRAST   Narrative CLINICAL DATA:  Low back pain extending into the hips. Pain is alternating to the right and left hip. Left total hip arthroplasty.  EXAM: LUMBAR MYELOGRAM  FLUOROSCOPY TIME:  Radiation Exposure Index (as provided by the fluoroscopic device): 268.79 uGy*m2  If the device does not provide the exposure index:  Fluoroscopy Time:  28 seconds  Number of Acquired Images:  15  PROCEDURE: After thorough discussion of risks and benefits of the  procedure including bleeding, infection, injury to nerves, blood vessels, adjacent structures as well as headache and CSF leak, written and oral informed consent was obtained. Consent was obtained by Dr. San Morelle. Time out form was completed.  Patient was positioned prone on the fluoroscopy table. Local anesthesia was provided with 1% lidocaine without epinephrine after prepped and draped in the usual sterile fashion. Puncture was performed at L5-S1 using a 3 1/2 inch 22-gauge spinal needle via left paramedian approach. Using a single pass through the dura, the needle was placed within the thecal sac, with return of clear CSF. 15 mL of Isovue-M 200 was injected into the thecal sac, with normal opacification of the nerve roots and cauda equina consistent with free flow within the subarachnoid space.  I personally performed the lumbar puncture and administered the intrathecal contrast. I also personally supervised acquisition of the myelogram images.  TECHNIQUE: Contiguous axial images were obtained through the Lumbar spine after the intrathecal infusion of infusion. Coronal and sagittal reconstructions were obtained of the axial image sets.  COMPARISON:  MRI of the lumbar spine 11/30/2015  FINDINGS: LUMBAR MYELOGRAM FINDINGS:  A transitional L1 segment is noted. Rightward curvature of the lumbar spine is centered at L2. Asymmetric leftward disc disease and endplate changes present at L2-3 and L3-4 with left-sided subarticular narrowing at both these levels. Mild subarticular narrowing is present on the right at L4-5. The lower nerve roots otherwise fill normally.  Standing images demonstrate slight exaggeration of the L3-4 and L4-5 disc protrusions. There is no significant change and disc protrusions or alignment through a limited range of flexion and extension.  Atherosclerotic calcifications are present in the aorta and iliac vessels without definite  aneurysm.  CT LUMBAR MYELOGRAM FINDINGS:  Five lumbar type vertebral bodies are present. AP alignment is anatomic. Rightward curvature of the lumbar spine is centered at L2. There is compensatory leftward curvature at L5-S1.  Limited imaging of the abdomen demonstrates atherosclerotic calcifications in the aorta and branch vessels without aneurysm. No solid organ lesions are evident. There is no significant adenopathy.  L1-2: There is mild asymmetry of facet hypertrophy on the left without significant stenosis.  L2-3: A a leftward disc protrusion and asymmetric facet hypertrophy results in mild left subarticular narrowing. The disc extends to the left foramen without significant foraminal stenosis.  L3-4: A leftward disc protrusion is present. Asymmetric moderate left-sided facet hypertrophy is noted. This results in moderate left and mild right subarticular narrowing. Moderate left foraminal stenosis is present.  L4-5: A mild broad-based disc protrusion is present. Facet hypertrophy is noted bilaterally. Mild left subarticular narrowing is present. The foramina are patent bilaterally.  L5-S1: A far right lateral disc protrusion and endplate osteophyte formation is noted. This results in moderate  right foraminal narrowing. The central canal and left foramen are patent.  IMPRESSION: 1. Dextroconvex curvature of the lumbar spine is centered at L2-3 with asymmetric disc disease and endplate change on the left at L2-3 and L3-4. 2. Mild left subarticular narrowing at L2-3. 3. Moderate left and mild right subarticular narrowing at L3-4. Moderate left foraminal stenosis is present. 4. Mild left subarticular narrowing at L4-5. 5. Moderate right foraminal stenosis at L5-S1 secondary to a far right lateral disc protrusion and asymmetric endplate osteophyte formation. 6. The disc protrusions at L3-4 and L4-5 for exaggerated with standing. 7. Atherosclerosis.   Electronically  Signed   By: San Morelle M.D.   On: 02/05/2016 11:50     Results for orders placed during the hospital encounter of 10/01/14  DG Lumbar Spine 2-3 Views   Narrative CLINICAL DATA:  Low back and left hip pain for 6 months without known injury.  EXAM: LUMBAR SPINE - 2-3 VIEW  COMPARISON:  None.  FINDINGS: Moderate dextroscoliosis of upper lumbar spine is noted. Diffuse osteopenia is noted. No fracture or spondylolisthesis is noted. Mild degenerative disc disease is noted at L2-3, L3-4 and L5-S1. Atherosclerosis of abdominal aorta is noted.  IMPRESSION: Multilevel degenerative disc disease is noted. No acute abnormality seen in the lumbar spine.   Electronically Signed   By: Marijo Conception, M.D.   On: 10/01/2014 15:36     Lumbar DG Myelogram Lumbosacral:  Results for orders placed during the hospital encounter of 02/05/16  DG MYELOGRAPHY LUMBAR INJ LUMBOSACRAL   Narrative CLINICAL DATA:  Low back pain extending into the hips. Pain is alternating to the right and left hip. Left total hip arthroplasty.  EXAM: LUMBAR MYELOGRAM  FLUOROSCOPY TIME:  Radiation Exposure Index (as provided by the fluoroscopic device): 268.79 uGy*m2  If the device does not provide the exposure index:  Fluoroscopy Time:  28 seconds  Number of Acquired Images:  15  PROCEDURE: After thorough discussion of risks and benefits of the procedure including bleeding, infection, injury to nerves, blood vessels, adjacent structures as well as headache and CSF leak, written and oral informed consent was obtained. Consent was obtained by Dr. San Morelle. Time out form was completed.  Patient was positioned prone on the fluoroscopy table. Local anesthesia was provided with 1% lidocaine without epinephrine after prepped and draped in the usual sterile fashion. Puncture was performed at L5-S1 using a 3 1/2 inch 22-gauge spinal needle via left paramedian approach. Using a single pass  through the dura, the needle was placed within the thecal sac, with return of clear CSF. 15 mL of Isovue-M 200 was injected into the thecal sac, with normal opacification of the nerve roots and cauda equina consistent with free flow within the subarachnoid space.  I personally performed the lumbar puncture and administered the intrathecal contrast. I also personally supervised acquisition of the myelogram images.  TECHNIQUE: Contiguous axial images were obtained through the Lumbar spine after the intrathecal infusion of infusion. Coronal and sagittal reconstructions were obtained of the axial image sets.  COMPARISON:  MRI of the lumbar spine 11/30/2015  FINDINGS: LUMBAR MYELOGRAM FINDINGS:  A transitional L1 segment is noted. Rightward curvature of the lumbar spine is centered at L2. Asymmetric leftward disc disease and endplate changes present at L2-3 and L3-4 with left-sided subarticular narrowing at both these levels. Mild subarticular narrowing is present on the right at L4-5. The lower nerve roots otherwise fill normally.  Standing images demonstrate slight exaggeration of the L3-4 and  L4-5 disc protrusions. There is no significant change and disc protrusions or alignment through a limited range of flexion and extension.  Atherosclerotic calcifications are present in the aorta and iliac vessels without definite aneurysm.  CT LUMBAR MYELOGRAM FINDINGS:  Five lumbar type vertebral bodies are present. AP alignment is anatomic. Rightward curvature of the lumbar spine is centered at L2. There is compensatory leftward curvature at L5-S1.  Limited imaging of the abdomen demonstrates atherosclerotic calcifications in the aorta and branch vessels without aneurysm. No solid organ lesions are evident. There is no significant adenopathy.  L1-2: There is mild asymmetry of facet hypertrophy on the left without significant stenosis.  L2-3: A a leftward disc protrusion and  asymmetric facet hypertrophy results in mild left subarticular narrowing. The disc extends to the left foramen without significant foraminal stenosis.  L3-4: A leftward disc protrusion is present. Asymmetric moderate left-sided facet hypertrophy is noted. This results in moderate left and mild right subarticular narrowing. Moderate left foraminal stenosis is present.  L4-5: A mild broad-based disc protrusion is present. Facet hypertrophy is noted bilaterally. Mild left subarticular narrowing is present. The foramina are patent bilaterally.  L5-S1: A far right lateral disc protrusion and endplate osteophyte formation is noted. This results in moderate right foraminal narrowing. The central canal and left foramen are patent.  IMPRESSION: 1. Dextroconvex curvature of the lumbar spine is centered at L2-3 with asymmetric disc disease and endplate change on the left at L2-3 and L3-4. 2. Mild left subarticular narrowing at L2-3. 3. Moderate left and mild right subarticular narrowing at L3-4. Moderate left foraminal stenosis is present. 4. Mild left subarticular narrowing at L4-5. 5. Moderate right foraminal stenosis at L5-S1 secondary to a far right lateral disc protrusion and asymmetric endplate osteophyte formation. 6. The disc protrusions at L3-4 and L4-5 for exaggerated with standing. 7. Atherosclerosis.   Electronically Signed   By: San Morelle M.D.   On: 02/05/2016 11:50    it. Hip-L MR wo contrast:  Results for orders placed during the hospital encounter of 07/27/10  MR Hip Left Wo Contrast   Narrative *RADIOLOGY REPORT*  Clinical Data: Left hip pain.  Left hip osteoarthritis.  Labral tear versus fracture.  MRI OF THE LEFT HIP WITHOUT CONTRAST  Technique:  Multiplanar, multisequence MR imaging was performed. No intravenous contrast was administered.  Comparison: None.  Findings: There is diffuse bone marrow edema in the left femoral head radiating away  from the articular surface.  There is a subchondral sclerotic area in the anterior femoral head most compatible with avascular necrosis which is favored over a subchondral insufficiency fracture.  There is no fracture of the femoral neck however bone marrow edema does radiate to the left femoral neck.  Reactive left hip effusion.  Mild left hip osteoarthritis with acetabular and femoral head chondromalacia. Incidental imaging of the right hip is unremarkable.  Sacral Tarlov cyst noted.  Bone marrow signal in the sacrum and pelvic bones is within normal limits.  Pubic symphysial degenerative disease. Common hamstring origins normal.  Reactive edema is present around the left hip.  The visceral pelvis demonstrates a septated cystic lesion in the left posterior anatomic pelvis measuring 63 mm x 44 mm x 47 mm.  IMPRESSION: 1.  Left femoral head subchondral sclerosis anteriorly and radiating bone marrow edema is most compatible with avascular necrosis.  Subchondral insufficiency fracture given secondary consideration.  Mild left hip osteoarthritis. 2.  Reactive left hip effusion.  Mild reactive soft tissue edema around the  left hip. 3.  Left anatomic pelvis cystic lesion measuring 63 mm x 44 mm x 47 mm.  Findings suspicious for cystic ovarian neoplasm.  Follow up transvaginal ultrasound recommended to further assess.  Gynecology evaluation may be necessary depending on the outcome of ultrasound.  Original Report Authenticated By: Dereck Ligas, M.D.    Hip-L DG 2-3 views:  Results for orders placed during the hospital encounter of 10/01/14  DG HIP UNILAT WITH PELVIS 2-3 VIEWS LEFT   Narrative CLINICAL DATA:  Left hip pain  EXAM: DG HIP (WITH OR WITHOUT PELVIS) 2-3V LEFT  COMPARISON:  07/14/2011  FINDINGS: Left hip replacement in satisfactory position alignment and unchanged from the prior study. No loosening or fracture. Mild degenerative change in the right hip. No pelvic  fracture.  IMPRESSION: Satisfactory left hip replacement.  No acute abnormality.   Electronically Signed   By: Franchot Gallo M.D.   On: 10/01/2014 15:35      Complexity Note: Imaging results reviewed. Results shared with Gina [name], using Layman's terms. Today I personally and independently reviewed the study images pertinent to Gina [name]'s problem.            I have personally examined the images and I agree with the reported  findings. I find no additional pain-related pathology to add to the report.  ROS  Cardiovascular: Daily Aspirin intake, High blood pressure and Blood thinners:  Antiplatelet Pulmonary or Respiratory: Snoring  Neurological: Stroke (Residual deficits or weakness: not noted) Review of Past Neurological Studies: No results found for this or any previous visit. Psychological-Psychiatric: No reported psychological or psychiatric signs or symptoms such as difficulty sleeping, anxiety, depression, delusions or hallucinations (schizophrenial), mood swings (bipolar disorders) or suicidal ideations or attempts Gastrointestinal: No reported gastrointestinal signs or symptoms such as vomiting or evacuating blood, reflux, heartburn, alternating episodes of diarrhea and constipation, inflamed or scarred liver, or pancreas or irrregular and/or infrequent bowel movements Genitourinary: No reported renal or genitourinary signs or symptoms such as difficulty voiding or producing urine, peeing blood, non-functioning kidney, kidney stones, difficulty emptying the bladder, difficulty controlling the flow of urine, or chronic kidney disease Hematological: Brusing easily Endocrine: No reported endocrine signs or symptoms such as high or low blood sugar, rapid heart rate due to high thyroid levels, obesity or weight gain due to slow thyroid or thyroid disease Rheumatologic: No reported rheumatological signs and symptoms such as fatigue, joint pain, tenderness, swelling, redness, heat, stiffness,  decreased range of motion, with or without associated rash Musculoskeletal: Negative for myasthenia gravis, muscular dystrophy, multiple sclerosis or malignant hyperthermia Work History: Retired  Allergies  Ms. Martone is allergic to hydrochlorothiazide; chlorthalidone; lisinopril; and penicillins.  Laboratory Chemistry  Inflammation Markers (CRP: Acute Phase) (ESR: Chronic Phase) Lab Results  Component Value Date   ESRSEDRATE 11 09/04/2014                         Rheumatology Markers Lab Results  Component Value Date   RF 8.7 09/04/2014                        Renal Function Markers Lab Results  Component Value Date   BUN 21 11/08/2017   CREATININE 0.96 11/08/2017   BCR 22 11/08/2017   GFRAA 66 11/08/2017   GFRNONAA 57 (L) 11/08/2017  Hepatic Function Markers Lab Results  Component Value Date   AST 18 11/08/2017   ALT 22 11/08/2017   ALBUMIN 4.2 11/08/2017   ALKPHOS 73 11/08/2017                        Electrolytes Lab Results  Component Value Date   NA 141 11/08/2017   K 4.0 11/08/2017   CL 102 11/08/2017   CALCIUM 9.7 11/08/2017                        Neuropathy Markers Lab Results  Component Value Date   HGBA1C 5.9 10/19/2012                        CNS Tests No results found for: COLORCSF, APPEARCSF, RBCCOUNTCSF, WBCCSF, POLYSCSF, LYMPHSCSF, EOSCSF, PROTEINCSF, GLUCCSF, JCVIRUS, CSFOLI, IGGCSF                      Bone Pathology Markers Lab Results  Component Value Date   VD25OH 30.5 11/08/2017                         Coagulation Parameters Lab Results  Component Value Date   INR 1.0 06/28/2011   LABPROT 13.2 06/28/2011   APTT 29.8 06/28/2011   PLT 179 11/08/2017                        Cardiovascular Markers Lab Results  Component Value Date   HGB 13.7 11/08/2017   HCT 40.5 11/08/2017                         CA Markers No results found for: CEA, CA125, LABCA2                      Note: Lab results  reviewed.  PFSH  Drug: Gina [name]  reports that she does not use drugs. Alcohol:  reports that she drinks alcohol. Tobacco:  reports that she quit smoking about 47 years ago. She has a 2.50 pack-year smoking history. She has never used smokeless tobacco. Medical:  has a past medical history of Abnormal blood sugar (08/03/2007), Hyperlipidemia, Hypertension (2010), and Mini stroke ([place]) (2010, 2014). Family: family history includes Congestive Heart Failure (age of onset: 74) in her mother; Heart disease (age of onset: 56) in her father; Heart failure (age of onset: 53) in her brother; Rheumatic fever in her father and sister.  Past Surgical History:  Procedure Laterality Date  . BREAST SURGERY Left 1982?   biopsy  . CESAREAN SECTION    . HERNIA REPAIR Left 2014   Laparoscopic placement  10 x 15 cm Physiomesh at lateral abdominal wall port site hernia  . HIP SURGERY Left 2013  . OOPHORECTOMY  2013   Active Ambulatory Problems    Diagnosis Date Noted  . Allergic rhinitis 06/13/2008  . 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 Cerebral artery occlusion with cerebral infarction ([place]) 05/04/2009  . Acid reflux 08/04/2007  . History of CVA (cerebrovascular accident) 07/18/2014  . Hypercholesteremia 08/03/2007  . Mononeuropathy of lower extremity 07/18/2014  . Disorder of mitral valve 04/21/2009  . 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 Adnexal mass 07/18/2014  . Avitaminosis D 07/18/2014  . Impacted cerumen of right ear 07/18/2014  . Arthritis 09/01/2014  . Hypertension, essential  03/17/2015  . Long-term use of high-risk medication 07/25/2016  . Chronic pain syndrome 07/25/2016  . CKD (chronic kidney disease) stage 3, GFR 30-59 ml/min (HCC) 11/07/2016  . Dyspnea on exertion 04/28/2017  . Overactive bladder 04/28/2017   Resolved Ambulatory Problems    Diagnosis Date Noted  . Incisional hernia, without obstruction or gangrene 05/03/2012  . Hernia of abdominal wall 08/02/2012  .  Abnormal blood sugar 08/03/2007  . Abnormal liver enzymes 07/18/2014  . Calcium blood increased 11/21/2008  . Beat, premature ventricular 09/22/2008  . Hernia of anterior abdominal wall 08/02/2012  . Hernia, incisional 05/03/2012  . Left hip pain 09/01/2014  . Palpitations 06/24/2013  . Opiate use 07/25/2016   Past Medical History:  Diagnosis Date  . Hyperlipidemia   . Hypertension 2010  . Mini stroke ([place]) 2010, 2014   Constitutional Exam  General appearance: Well nourished, well developed, and well hydrated. In no apparent acute distress Vitals:   12/12/17 0858  BP: (!) 149/78  Pulse: 70  Resp: 18  Temp: 98.2 F (36.8 C)  SpO2: 98%  Weight: 140 lb (63.5 kg)  Height: _0  (1.626 m)   BMI Assessment: Estimated body mass index is 24.03 kg/m as calculated from the following:   Height as of this encounter: _1  (1.626 m).   Weight as of this encounter: 140 lb (63.5 kg).  BMI interpretation table: BMI level Category Range association with higher incidence of chronic pain  <18 kg/m2 Underweight   18.5-24.9 kg/m2 Ideal body weight   25-29.9 kg/m2 Overweight Increased incidence by 20%  30-34.9 kg/m2 Obese (Class I) Increased incidence by 68%  35-39.9 kg/m2 Severe obesity (Class II) Increased incidence by 136%  >40 kg/m2 Extreme obesity (Class III) Increased incidence by 254%   Patient's current BMI Ideal Body weight  Body mass index is 24.03 kg/m. Ideal body weight: 54.7 kg (120 lb 9.5 oz) Adjusted ideal body weight: 58.2 kg (128 lb 5.7 oz)   BMI Readings from Last 4 Encounters:  12/12/17 24.03 kg/m  10/30/17 24.41 kg/m  09/12/17 23.69 kg/m  08/08/17 23.69 kg/m   Wt Readings from Last 4 Encounters:  12/12/17 140 lb (63.5 kg)  10/30/17 142 lb 3.2 oz (64.5 kg)  09/12/17 138 lb (62.6 kg)  08/08/17 138 lb (62.6 kg)  Psych/Mental status: Alert, oriented x 3 (person, place, & time)       Eyes: PERLA Respiratory: No evidence of acute respiratory  distress  Cervical Spine Area Exam  Skin & Axial Inspection: No masses, redness, edema, swelling, or associated skin lesions Alignment: Symmetrical Functional ROM: Unrestricted ROM      Stability: No instability detected Muscle Tone/Strength: Functionally intact. No obvious neuro-muscular anomalies detected. Sensory (Neurological): Unimpaired Palpation: No palpable anomalies              Upper Extremity (UE) Exam    Side: Right upper extremity  Side: Left upper extremity  Skin & Extremity Inspection: Skin color, temperature, and hair growth are WNL. No peripheral edema or cyanosis. No masses, redness, swelling, asymmetry, or associated skin lesions. No contractures.  Skin & Extremity Inspection: Skin color, temperature, and hair growth are WNL. No peripheral edema or cyanosis. No masses, redness, swelling, asymmetry, or associated skin lesions. No contractures.  Functional ROM: Unrestricted ROM          Functional ROM: Unrestricted ROM          Muscle Tone/Strength: Functionally intact. No obvious neuro-muscular anomalies detected.  Muscle Tone/Strength: Functionally intact. No  obvious neuro-muscular anomalies detected.  Sensory (Neurological): Unimpaired          Sensory (Neurological): Unimpaired          Palpation: No palpable anomalies              Palpation: No palpable anomalies              Provocative Test(s):  Phalen's test: deferred Tinel's test: deferred Apley's scratch test (touch opposite shoulder):  Action 1 (Across chest): deferred Action 2 (Overhead): deferred Action 3 (LB reach): deferred   Provocative Test(s):  Phalen's test: deferred Tinel's test: deferred Apley's scratch test (touch opposite shoulder):  Action 1 (Across chest): deferred Action 2 (Overhead): deferred Action 3 (LB reach): deferred    Thoracic Spine Area Exam  Skin & Axial Inspection: No masses, redness, or swelling Alignment: Symmetrical Functional ROM: Unrestricted ROM Stability: No  instability detected Muscle Tone/Strength: Functionally intact. No obvious neuro-muscular anomalies detected. Sensory (Neurological): Unimpaired Muscle strength & Tone: No palpable anomalies  Lumbar Spine Area Exam  Skin & Axial Inspection: No masses, redness, or swelling Alignment: Symmetrical Functional ROM: Decreased ROM       Stability: No instability detected Muscle Tone/Strength: Functionally intact. No obvious neuro-muscular anomalies detected. Sensory (Neurological): Articular pain pattern Palpation: No palpable anomalies       Provocative Tests: Hyperextension/rotation test: (+) bilaterally for facet joint pain. Lumbar quadrant test (Kemp's test): (+) bilaterally for facet joint pain. Lateral bending test: (+) due to pain. Patrick's Maneuver: (+) for bilateral S-I arthralgia             FABER test: deferred today                   S-I anterior distraction/compression test: deferred today         S-I lateral compression test: deferred today         S-I Thigh-thrust test: deferred today         S-I Gaenslen's test: deferred today          Gait & Posture Assessment  Ambulation: Unassisted Gait: Relatively normal for age and body habitus Posture: WNL   Lower Extremity Exam    Side: Right lower extremity  Side: Left lower extremity  Stability: No instability observed          Stability: No instability observed          Skin & Extremity Inspection: Skin color, temperature, and hair growth are WNL. No peripheral edema or cyanosis. No masses, redness, swelling, asymmetry, or associated skin lesions. No contractures.  Skin & Extremity Inspection: Skin color, temperature, and hair growth are WNL. No peripheral edema or cyanosis. No masses, redness, swelling, asymmetry, or associated skin lesions. No contractures.  Functional ROM: Unrestricted ROM                  Functional ROM: Decreased ROM for all joints of the lower extremity          Muscle Tone/Strength: Functionally intact.  No obvious neuro-muscular anomalies detected.  Muscle Tone/Strength: Functionally intact. No obvious neuro-muscular anomalies detected.  Sensory (Neurological): Unimpaired medial portion of foot (L4)  Sensory (Neurological): Dermatomal pain pattern top of foot & big toe (L5)  DTR: Patellar: deferred today Achilles: deferred today Plantar: deferred today  DTR: Patellar: deferred today Achilles: deferred today Plantar: deferred today  Palpation: No palpable anomalies  Palpation: No palpable anomalies   Assessment  Primary Diagnosis & Pertinent Problem List: The primary encounter diagnosis was  Lumbar spondylosis. Diagnoses of Lumbar facet arthropathy, Chronic pain syndrome, History of left hip replacement, and SI joint arthritis were also pertinent to this visit.  Visit Diagnosis (New problems to examiner): 1. Lumbar spondylosis   2. Lumbar facet arthropathy   3. Chronic pain syndrome   4. History of left hip replacement   5. SI joint arthritis    General Recommendations: The pain condition that the patient suffers from is best treated with a multidisciplinary approach that involves an increase in physical activity to prevent de-conditioning and worsening of the pain cycle, as well as psychological counseling (formal and/or informal) to address the co-morbid psychological affects of pain. Treatment will often involve judicious use of pain medications and interventional procedures to decrease the pain, allowing the patient to participate in the physical activity that will ultimately produce long-lasting pain reductions. The goal of the multidisciplinary approach is to return the patient to a higher level of overall function and to restore their ability to perform activities of daily living.  Very pleasant 77 year old female who presents with a chief complaint of axial low back pain that radiates into bilateral buttocks and posterior thighs.  This is worse on the left side.  Patient does have a  history of left hip replacement in 2013.  Otherwise she has had a history of prior TIAs which have left some sensory deficits in her left hand.  Otherwise no significant motor deficits.  She is on a full-strength aspirin.  Patient is previously tried lumbar epidural steroid injections which were not helpful.  She denies any bowel or bladder dysfunction.  Patient is tried physical therapy, NSAID trials including ibuprofen and Aleve as well as neuropathic's which resulted in sedation.  Patient wants to avoid opioid medications given that she does have a history of falls and wants to avoid any sedative medications.  Patient's lumbar MRI shows diffuse lumbar degenerative disc disease, lumbar facet arthropathy, lumbar spondylosis most pronounced at L2, L3, L4, L5 along with disc herniations affecting lumbar spinal nerve roots most prominent at left L5.  Gina [name] has a history of greater than 3 months of moderate to severe pain which is resulted in functional impairment.  The patient has tried various conservative therapeutic options such as NSAIDs, Tylenol, muscle relaxants, physical therapy which was inadequately effective.  Patient's pain is predominantly axial with physical exam findings suggestive of facet arthropathy.  Lumbar facet medial branch nerve blocks were discussed with the patient.  Risks and benefits were reviewed.  Patient would like to proceed with bilateral L2, L3, L4, L5 medial branch nerve block.  Patient advised to reduce ASA 325 to 81 mg 7 days prior to procedure.  PLAN: Ordered Lab-work, Procedure(s), Referral(s), & Consult(s): Orders Placed This Encounter  Procedures  . LUMBAR FACET(MEDIAL BRANCH NERVE BLOCK) MBNB   Other analgesic(s): To be determined at a later time   Interventional management options: Ms. Daigneault was informed that there is no guarantee that she would be a candidate for interventional therapies. The decision will be based on the results of diagnostic studies, as  well as Ms. Rape's risk profile.  Procedure(s) under consideration:  Lumbar facet blocks Lumbar RFA SI joint block SI RFA   Provider-requested follow-up: Return in about 2 weeks (around 12/26/2017) for Procedure.  Future Appointments  Date Time Provider [place]  12/25/2017  8:45 AM Gillis Santa, MD [facility] None  01/31/2018  2:40 PM Brita Romp, Dionne Bucy, MD [facility] None  06/04/2018 10:40 AM [employer] HEALTH ADVISOR [id number]  None    Primary Care Physician: Virginia Crews, MD Location: [facility] Note by: Gillis Santa, M.D, Date: 12/12/2017; Time: 10:28 AM  Patient Instructions   Please stop ASA 325 7 days prior to scheduled procedure and take 81 mg instead  Moderate Conscious Sedation, Adult Sedation is the use of medicines to promote relaxation and relieve discomfort and anxiety. Moderate conscious sedation is a type of sedation. Under moderate conscious sedation, you are less alert than normal, but you are still able to respond to instructions, touch, or both. Moderate conscious sedation is used during short medical and dental procedures. It is milder than deep sedation, which is a type of sedation under which you cannot be easily woken up. It is also milder than general anesthesia, which is the use of medicines to make you unconscious. Moderate conscious sedation allows you to return to your regular activities sooner. Tell a health care provider about:  Any allergies you have.  All medicines you are taking, including vitamins, herbs, eye drops, creams, and over-the-counter medicines.  Use of steroids (by mouth or creams).  Any problems you or family members have had with sedatives and anesthetic medicines.  Any blood disorders you have.  Any surgeries you have had.  Any medical conditions you have, such as sleep apnea.  Whether you are pregnant or may be pregnant.  Any use of cigarettes, alcohol, marijuana, or street  drugs. What are the risks? Generally, this is a safe procedure. However, problems may occur, including:  Getting too much medicine (oversedation).  Nausea.  Allergic reaction to medicines.  Trouble breathing. If this happens, a breathing tube may be used to help with breathing. It will be removed when you are awake and breathing on your own.  Heart trouble.  Lung trouble.  What happens before the procedure? Staying hydrated Follow instructions from your health care provider about hydration, which may include:  Up to 2 hours before the procedure - you may continue to drink clear liquids, such as water, clear fruit juice, black coffee, and plain tea.  Eating and drinking restrictions Follow instructions from your health care provider about eating and drinking, which may include:  8 hours before the procedure - stop eating heavy meals or foods such as meat, fried foods, or fatty foods.  6 hours before the procedure - stop eating light meals or foods, such as toast or cereal.  6 hours before the procedure - stop drinking milk or drinks that contain milk.  2 hours before the procedure - stop drinking clear liquids.  Medicine  Ask your health care provider about:  Changing or stopping your regular medicines. This is especially important if you are taking diabetes medicines or blood thinners.  Taking medicines such as aspirin and ibuprofen. These medicines can thin your blood. Do not take these medicines before your procedure if your health care provider instructs you not to.  Tests and exams  You will have a physical exam.  You may have blood tests done to show: ? How well your kidneys and liver are working. ? How well your blood can clot. General instructions  Plan to have someone take you home from the hospital or clinic.  If you will be going home right after the procedure, plan to have someone with you for 24 hours. What happens during the procedure?  An IV tube  will be inserted into one of your veins.  Medicine to help you relax (sedative) will be given through  the IV tube.  The medical or dental procedure will be performed. What happens after the procedure?  Your blood pressure, heart rate, breathing rate, and blood oxygen level will be monitored often until the medicines you were given have worn off.  Do not drive for 24 hours. This information is not intended to replace advice given to you by your health care provider. Make sure you discuss any questions you have with your health care provider. Document Released: 10/05/2000 Document Revised: 06/16/2015 Document Reviewed: 05/02/2015 Elsevier Interactive Patient Education  2018 [employer]  What are the risk, side effects and possible complications? Generally speaking, most procedures are safe.  However, with any procedure there are risks, side effects, and the possibility of complications.  The risks and complications are dependent upon the sites that are lesioned, or the type of nerve block to be performed.  The closer the procedure is to the spine, the more serious the risks are.  Great care is taken when placing the radio frequency needles, block needles or lesioning probes, but sometimes complications can occur. 1. Infection: Any time there is an injection through the skin, there is a risk of infection.  This is why sterile conditions are used for these blocks.  There are four possible types of infection. 1. Localized skin infection. 2. Central Nervous System Infection-This can be in the form of Meningitis, which can be deadly. 3. Epidural Infections-This can be in the form of an epidural abscess, which can cause pressure inside of the spine, causing compression of the spinal cord with subsequent paralysis. This would require an emergency surgery to decompress, and there are no guarantees that the patient would recover from the paralysis. 4. Discitis-This is an  infection of the intervertebral discs.  It occurs in about 1% of discography procedures.  It is difficult to treat and it may lead to surgery.        2. Pain: the needles have to go through skin and soft tissues, will cause soreness.       3. Damage to internal structures:  The nerves to be lesioned may be near blood vessels or    other nerves which can be potentially damaged.       4. Bleeding: Bleeding is more common if the patient is taking blood thinners such as  aspirin, Coumadin, Ticiid, Plavix, etc., or if he/she have some genetic predisposition  such as hemophilia. Bleeding into the spinal canal can cause compression of the spinal  cord with subsequent paralysis.  This would require an emergency surgery to  decompress and there are no guarantees that the patient would recover from the  paralysis.       5. Pneumothorax:  Puncturing of a lung is a possibility, every time a needle is introduced in  the area of the chest or upper back.  Pneumothorax refers to free air around the  collapsed lung(s), inside of the thoracic cavity (chest cavity).  Another two possible  complications related to a similar event would include: Hemothorax and Chylothorax.   These are variations of the Pneumothorax, where instead of air around the collapsed  lung(s), you may have blood or chyle, respectively.       6. Spinal headaches: They may occur with any procedures in the area of the spine.       7. Persistent CSF (Cerebro-Spinal Fluid) leakage: This is a rare problem, but may occur  with prolonged intrathecal or epidural catheters either due to the  formation of a fistulous  track or a dural tear.       8. Nerve damage: By working so close to the spinal cord, there is always a possibility of  nerve damage, which could be as serious as a permanent spinal cord injury with  paralysis.       9. Death:  Although rare, severe deadly allergic reactions known as "Anaphylactic  reaction" can occur to any of the medications used.       10. Worsening of the symptoms:  We can always make thing worse.  What are the chances of something like this happening? Chances of any of this occuring are extremely low.  By statistics, you have more of a chance of getting killed in a motor vehicle accident: while driving to the hospital than any of the above occurring .  Nevertheless, you should be aware that they are possibilities.  In general, it is similar to taking a shower.  Everybody knows that you can slip, hit your head and get killed.  Does that mean that you should not shower again?  Nevertheless always keep in mind that statistics do not mean anything if you happen to be on the wrong side of them.  Even if a procedure has a 1 (one) in a 1,000,000 (million) chance of going wrong, it you happen to be that one..Also, keep in mind that by statistics, you have more of a chance of having something go wrong when taking medications.  Who should not have this procedure? If you are on a blood thinning medication (e.g. Coumadin, Plavix, see list of "Blood Thinners"), or if you have an active infection going on, you should not have the procedure.  If you are taking any blood thinners, please inform your physician.  How should I prepare for this procedure?  Do not eat or drink anything at least six hours prior to the procedure.  Bring a driver with you .  It cannot be a taxi.  Come accompanied by an adult that can drive you back, and that is strong enough to help you if your legs get weak or numb from the local anesthetic.  Take all of your medicines the morning of the procedure with just enough water to swallow them.  If you have diabetes, make sure that you are scheduled to have your procedure done first thing in the morning, whenever possible.  If you have diabetes, take only half of your insulin dose and notify our nurse that you have done so as soon as you arrive at the clinic.  If you are diabetic, but only take blood sugar pills  (oral hypoglycemic), then do not take them on the morning of your procedure.  You may take them after you have had the procedure.  Do not take aspirin or any aspirin-containing medications, at least eleven (11) days prior to the procedure.  They may prolong bleeding.  Wear loose fitting clothing that may be easy to take off and that you would not mind if it got stained with Betadine or blood.  Do not wear any jewelry or perfume  Remove any nail coloring.  It will interfere with some of our monitoring equipment.  NOTE: Remember that this is not meant to be interpreted as a complete list of all possible complications.  Unforeseen problems may occur.  BLOOD THINNERS The following drugs contain aspirin or other products, which can cause increased bleeding during surgery and should not be taken for 2 weeks prior to  and 1 week after surgery.  If you should need take something for relief of minor pain, you may take acetaminophen which is found in Tylenol,m Datril, Anacin-3 and Panadol. It is not blood thinner. The products listed below are.  Do not take any of the products listed below in addition to any listed on your instruction sheet.  A.P.C or A.P.C with Codeine Codeine Phosphate Capsules #3 Ibuprofen Ridaura  ABC compound Congesprin Imuran rimadil  Advil Cope Indocin Robaxisal  Alka-Seltzer Effervescent Pain Reliever and Antacid Coricidin or Coricidin-D  Indomethacin Rufen  Alka-Seltzer plus Cold Medicine Cosprin Ketoprofen S-A-C Tablets  Anacin Analgesic Tablets or Capsules Coumadin Korlgesic Salflex  Anacin Extra Strength Analgesic tablets or capsules CP-2 Tablets Lanoril Salicylate  Anaprox Cuprimine Capsules Levenox Salocol  Anexsia-D Dalteparin Magan Salsalate  Anodynos Darvon compound Magnesium Salicylate Sine-off  Ansaid Dasin Capsules Magsal Sodium Salicylate  Anturane Depen Capsules Marnal Soma  APF Arthritis pain formula Dewitt's Pills Measurin Stanback  Argesic Dia-Gesic  Meclofenamic Sulfinpyrazone  Arthritis Bayer Timed Release Aspirin Diclofenac Meclomen Sulindac  Arthritis pain formula Anacin Dicumarol Medipren Supac  Analgesic (Safety coated) Arthralgen Diffunasal Mefanamic Suprofen  Arthritis Strength Bufferin Dihydrocodeine Mepro Compound Suprol  Arthropan liquid Dopirydamole Methcarbomol with Aspirin Synalgos  ASA tablets/Enseals Disalcid Micrainin Tagament  Ascriptin Doan's Midol Talwin  Ascriptin A/D Dolene Mobidin Tanderil  Ascriptin Extra Strength Dolobid Moblgesic Ticlid  Ascriptin with Codeine Doloprin or Doloprin with Codeine Momentum Tolectin  Asperbuf Duoprin Mono-gesic Trendar  Aspergum Duradyne Motrin or Motrin IB Triminicin  Aspirin plain, buffered or enteric coated Durasal Myochrisine Trigesic  Aspirin Suppositories Easprin Nalfon Trillsate  Aspirin with Codeine Ecotrin Regular or Extra Strength Naprosyn Uracel  Atromid-S Efficin Naproxen Ursinus  Auranofin Capsules Elmiron Neocylate Vanquish  Axotal Emagrin Norgesic Verin  Azathioprine Empirin or Empirin with Codeine Normiflo Vitamin E  Azolid Emprazil Nuprin Voltaren  Bayer Aspirin plain, buffered or children's or timed BC Tablets or powders Encaprin Orgaran Warfarin Sodium  Buff-a-Comp Enoxaparin Orudis Zorpin  Buff-a-Comp with Codeine Equegesic Os-Cal-Gesic   Buffaprin Excedrin plain, buffered or Extra Strength Oxalid   Bufferin Arthritis Strength Feldene Oxphenbutazone   Bufferin plain or Extra Strength Feldene Capsules Oxycodone with Aspirin   Bufferin with Codeine Fenoprofen Fenoprofen Pabalate or Pabalate-SF   Buffets II Flogesic Panagesic   Buffinol plain or Extra Strength Florinal or Florinal with Codeine Panwarfarin   Buf-Tabs Flurbiprofen Penicillamine   Butalbital Compound Four-way cold tablets Penicillin   Butazolidin Fragmin Pepto-Bismol   Carbenicillin Geminisyn Percodan   Carna Arthritis Reliever Geopen Persantine   Carprofen Gold's salt Persistin    Chloramphenicol Goody's Phenylbutazone   Chloromycetin Haltrain Piroxlcam   Clmetidine heparin Plaquenil   Cllnoril Hyco-pap Ponstel   Clofibrate Hydroxy chloroquine Propoxyphen         Before stopping any of these medications, be sure to consult the physician who ordered them.  Some, such as Coumadin (Warfarin) are ordered to prevent or treat serious conditions such as "deep thrombosis", "pumonary embolisms", and other heart problems.  The amount of time that you may need off of the medication may also vary with the medication and the reason for which you were taking it.  If you are taking any of these medications, please make sure you notify your pain physician before you undergo any procedures.    Facet Blocks Patient Information  Description: The facets are joints in the spine between the vertebrae.  Like any joints in the body, facets can become irritated and painful.  Arthritis can also  effect the facets.  By injecting steroids and local anesthetic in and around these joints, we can temporarily block the nerve supply to them.  Steroids act directly on irritated nerves and tissues to reduce selling and inflammation which often leads to decreased pain.  Facet blocks may be done anywhere along the spine from the neck to the low back depending upon the location of your pain.   After numbing the skin with local anesthetic (like Novocaine), a small needle is passed onto the facet joints under x-Mckeithan guidance.  You may experience a sensation of pressure while this is being done.  The entire block usually lasts about 15-25 minutes.   Conditions which may be treated by facet blocks:   Low back/buttock pain  Neck/shoulder pain  Certain types of headaches  Preparation for the injection:  1. Do not eat any solid food or dairy products within 8 hours of your appointment. 2. You may drink clear liquid up to 3 hours before appointment.  Clear liquids include water, black coffee, juice or soda.   No milk or cream please. 3. You may take your regular medication, including pain medications, with a sip of water before your appointment.  Diabetics should hold regular insulin (if taken separately) and take 1/2 normal NPH dose the morning of the procedure.  Carry some sugar containing items with you to your appointment. 4. A driver must accompany you and be prepared to drive you home after your procedure. 5. Bring all your current medications with you. 6. An IV may be inserted and sedation may be given at the discretion of the physician. 7. A blood pressure cuff, EKG and other monitors will often be applied during the procedure.  Some patients may need to have extra oxygen administered for a short period. 8. You will be asked to provide medical information, including your allergies and medications, prior to the procedure.  We must know immediately if you are taking blood thinners (like Coumadin/Warfarin) or if you are allergic to IV iodine contrast (dye).  We must know if you could possible be pregnant.  Possible side-effects:   Bleeding from needle site  Infection (rare, may require surgery)  Nerve injury (rare)  Numbness & tingling (temporary)  Difficulty urinating (rare, temporary)  Spinal headache (a headache worse with upright posture)  Light-headedness (temporary)  Pain at injection site (serveral days)  Decreased blood pressure (rare, temporary)  Weakness in arm/leg (temporary)  Pressure sensation in back/neck (temporary)   Call if you experience:   Fever/chills associated with headache or increased back/neck pain  Headache worsened by an upright position  New onset, weakness or numbness of an extremity below the injection site  Hives or difficulty breathing (go to the emergency room)  Inflammation or drainage at the injection site(s)  Severe back/neck pain greater than usual  New symptoms which are concerning to you  Please note:  Although the local  anesthetic injected can often make your back or neck feel good for several hours after the injection, the pain will likely return. It takes 3-7 days for steroids to work.  You may not notice any pain relief for at least one week.  If effective, we will often do a series of 2-3 injections spaced 3-6 weeks apart to maximally decrease your pain.  After the initial series, you may be a candidate for a more permanent nerve block of the facets.  If you have any questions, please call #[phone]) [facility]

## 2017-12-25 ENCOUNTER — Ambulatory Visit: Payer: Medicare Other | Admitting: Student in an Organized Health Care Education/Training Program

## 2018-01-10 ENCOUNTER — Ambulatory Visit (HOSPITAL_BASED_OUTPATIENT_CLINIC_OR_DEPARTMENT_OTHER): Payer: Medicare Other | Admitting: Student in an Organized Health Care Education/Training Program

## 2018-01-10 ENCOUNTER — Encounter: Payer: Self-pay | Admitting: Student in an Organized Health Care Education/Training Program

## 2018-01-10 ENCOUNTER — Other Ambulatory Visit: Payer: Self-pay

## 2018-01-10 ENCOUNTER — Ambulatory Visit
Admission: RE | Admit: 2018-01-10 | Discharge: 2018-01-10 | Disposition: A | Discharge status: Home / Self Care | Payer: Medicare Other | Source: Ambulatory Visit | Attending: Student in an Organized Health Care Education/Training Program | Admitting: Student in an Organized Health Care Education/Training Program

## 2018-01-10 VITALS — BP 159/80 | HR 72 | Temp 98.2°F | Resp 14 | Ht 64.0 in | Wt 140.0 lb

## 2018-01-10 DIAGNOSIS — M47816 Spondylosis without myelopathy or radiculopathy, lumbar region: Secondary | ICD-10-CM | POA: Insufficient documentation

## 2018-01-10 MED ORDER — LIDOCAINE HCL 2 % IJ SOLN
20.0000 mL | Freq: Once | INTRAMUSCULAR | Status: AC
  Administered 2018-01-10: 400 mg

## 2018-01-10 MED ORDER — LACTATED RINGERS IV SOLN: 1000.0000 mL | Freq: Once | INTRAVENOUS | Status: DC

## 2018-01-10 MED ORDER — DEXAMETHASONE SODIUM PHOSPHATE 10 MG/ML IJ SOLN
INTRAMUSCULAR | Status: AC
  Filled 2018-01-10: qty 2

## 2018-01-10 MED ORDER — DIAZEPAM 5 MG PO TABS: 5.0000 mg | ORAL_TABLET | Freq: Once | ORAL | Status: DC

## 2018-01-10 MED ORDER — LIDOCAINE HCL 2 % IJ SOLN
INTRAMUSCULAR | Status: AC
  Filled 2018-01-10: qty 20

## 2018-01-10 MED ORDER — ROPIVACAINE HCL 2 MG/ML IJ SOLN
1.0000 mL | Freq: Once | INTRAMUSCULAR | Status: AC
  Administered 2018-01-10: 20 mL via EPIDURAL

## 2018-01-10 MED ORDER — DEXAMETHASONE SODIUM PHOSPHATE 10 MG/ML IJ SOLN
10.0000 mg | Freq: Once | INTRAMUSCULAR | Status: AC
  Administered 2018-01-10: 10 mg

## 2018-01-10 MED ORDER — ROPIVACAINE HCL 2 MG/ML IJ SOLN
10.0000 mL | Freq: Once | INTRAMUSCULAR | Status: AC
  Administered 2018-01-10: 10 mL

## 2018-01-10 MED ORDER — FENTANYL CITRATE (PF) 100 MCG/2ML IJ SOLN: 25.0000 ug | INTRAMUSCULAR | Status: DC | PRN

## 2018-01-10 MED ORDER — DIAZEPAM 5 MG PO TABS
ORAL_TABLET | ORAL | Status: AC
  Filled 2018-01-10: qty 1

## 2018-01-10 MED ORDER — ROPIVACAINE HCL 2 MG/ML IJ SOLN
INTRAMUSCULAR | Status: AC
  Filled 2018-01-10: qty 20

## 2018-01-10 MED ORDER — FENTANYL CITRATE (PF) 100 MCG/2ML IJ SOLN
INTRAMUSCULAR | Status: AC
  Filled 2018-01-10: qty 2

## 2018-01-10 NOTE — Progress Notes (Signed)
Safety precautions to be maintained throughout the outpatient stay will include: orient to surroundings, keep bed in low position, maintain call bell within reach at all times, provide assistance with transfer out of bed and ambulation.  

## 2018-01-10 NOTE — Progress Notes (Signed)
Patient's Name: Gina [name]  MRN: [id number]  Referring Provider: Erasmo Downer, MD  DOB: [birth year]  PCP: Erasmo Downer, MD  DOS: 01/10/2018  Note by: Edward Jolly, MD  Service setting: Ambulatory outpatient  Specialty: Interventional Pain Management  Patient type: Established  Location: [facility] (AMB) Pain Management Facility  Visit type: Interventional Procedure   Primary Reason for Visit: Interventional Pain Management Treatment. CC: Back Pain (low) and Leg Pain (left is worse and lateral mostly)  Procedure:          Anesthesia, Analgesia, Anxiolysis:  Type: Lumbar Facet, Medial Branch Block(s) #1  Primary Purpose: Diagnostic Region: Posterolateral Lumbosacral Spine Level: L2, L3, L4, L5, Medial Branch Level(s). Injecting these levels blocks the L3-4, L4-5, and L5-S1 lumbar facet joints. Laterality: Bilateral  Type: Moderate (Conscious) Sedation combined with Local Anesthesia Indication(s): Analgesia and Anxiety Route: Intravenous (IV) IV Access: Secured Sedation: Meaningful verbal contact was maintained at all times during the procedure  Local Anesthetic: Lidocaine 1-2%  Position: Prone   Indications: 1. Lumbar spondylosis    Pain Score: Pre-procedure: 8 /10 Post-procedure: 0-No pain/10  Pre-op Assessment:  Gina [name] is a 77 y.o. (year old), female patient, seen today for interventional treatment. She  has a past surgical history that includes Oophorectomy (2013); Hip surgery (Left, 2013); Cesarean section; Breast surgery (Left, 1982?); and Hernia repair (Left, 2014). Gina [name] has a current medication list which includes the following prescription(s): acetaminophen, amlodipine, atorvastatin, cetirizine, cholecalciferol, losartan, metoprolol tartrate, mirabegron er, ranitidine, and aspirin, and the following Facility-Administered Medications: diazepam, fentanyl, and lactated ringers. Her primarily concern today is the Back Pain (low) and Leg Pain (left is worse and lateral  mostly)  Initial Vital Signs:  Pulse/HCG Rate: (!) 53  Temp: 98.2 F (36.8 C) Resp: 18 BP: (!) 166/72 SpO2: 96 %  BMI: Estimated body mass index is 24.03 kg/m as calculated from the following:   Height as of this encounter: 5\' 4"  (1.626 m).   Weight as of this encounter: 140 lb (63.5 kg).  Risk Assessment: Allergies: Reviewed. She is allergic to hydrochlorothiazide; chlorthalidone; lisinopril; and penicillins.  Allergy Precautions: None required Coagulopathies: Reviewed. None identified.  Blood-thinner therapy: None at this time Active Infection(s): Reviewed. None identified. Gina [name] is afebrile  Site Confirmation: Gina [name] was asked to confirm the procedure and laterality before marking the site Procedure checklist: Completed Consent: Before the procedure and under the influence of no sedative(s), amnesic(s), or anxiolytics, the patient was informed of the treatment options, risks and possible complications. To fulfill our ethical and legal obligations, as recommended by the American Medical Association's Code of Ethics, I have informed the patient of my clinical impression; the nature and purpose of the treatment or procedure; the risks, benefits, and possible complications of the intervention; the alternatives, including doing nothing; the risk(s) and benefit(s) of the alternative treatment(s) or procedure(s); and the risk(s) and benefit(s) of doing nothing. The patient was provided information about the general risks and possible complications associated with the procedure. These may include, but are not limited to: failure to achieve desired goals, infection, bleeding, organ or nerve damage, allergic reactions, paralysis, and death. In addition, the patient was informed of those risks and complications associated to Spine-related procedures, such as failure to decrease pain; infection (i.e.: Meningitis, epidural or intraspinal abscess); bleeding (i.e.: epidural hematoma, subarachnoid  hemorrhage, or any other type of intraspinal or peri-dural bleeding); organ or nerve damage (i.e.: Any type of peripheral nerve, nerve root, or spinal cord injury)  with subsequent damage to sensory, motor, and/or autonomic systems, resulting in permanent pain, numbness, and/or weakness of one or several areas of the body; allergic reactions; (i.e.: anaphylactic reaction); and/or death. Furthermore, the patient was informed of those risks and complications associated with the medications. These include, but are not limited to: allergic reactions (i.e.: anaphylactic or anaphylactoid reaction(s)); adrenal axis suppression; blood sugar elevation that in diabetics may result in ketoacidosis or comma; water retention that in patients with history of congestive heart failure may result in shortness of breath, pulmonary edema, and decompensation with resultant heart failure; weight gain; swelling or edema; medication-induced neural toxicity; particulate matter embolism and blood vessel occlusion with resultant organ, and/or nervous system infarction; and/or aseptic necrosis of one or more joints. Finally, the patient was informed that Medicine is not an exact science; therefore, there is also the possibility of unforeseen or unpredictable risks and/or possible complications that may result in a catastrophic outcome. The patient indicated having understood very clearly. We have given the patient no guarantees and we have made no promises. Enough time was given to the patient to ask questions, all of which were answered to the patient's satisfaction. Gina [name] has indicated that she wanted to continue with the procedure. Attestation: I, the ordering provider, attest that I have discussed with the patient the benefits, risks, side-effects, alternatives, likelihood of achieving goals, and potential problems during recovery for the procedure that I have provided informed consent. Date  Time: 01/10/2018  8:57  AM  Pre-Procedure Preparation:  Monitoring: As per clinic protocol. Respiration, ETCO2, SpO2, BP, heart rate and rhythm monitor placed and checked for adequate function Safety Precautions: Patient was assessed for positional comfort and pressure points before starting the procedure. Time-out: I initiated and conducted the "Time-out" before starting the procedure, as per protocol. The patient was asked to participate by confirming the accuracy of the "Time Out" information. Verification of the correct person, site, and procedure were performed and confirmed by me, the nursing staff, and the patient. "Time-out" conducted as per Joint Commission's Universal Protocol (UP.01.01.01). Time: 0939  Description of Procedure:          Laterality: Bilateral. The procedure was performed in identical fashion on both sides. Levels:  L2, L3, L4, L5, & S1 Medial Branch Level(s) Area Prepped: Posterior Lumbosacral Region Prepping solution: ChloraPrep (2% chlorhexidine gluconate and 70% isopropyl alcohol) Safety Precautions: Aspiration looking for blood return was conducted prior to all injections. At no point did we inject any substances, as a needle was being advanced. Before injecting, the patient was told to immediately notify me if she was experiencing any new onset of "ringing in the ears, or metallic taste in the mouth". No attempts were made at seeking any paresthesias. Safe injection practices and needle disposal techniques used. Medications properly checked for expiration dates. SDV (single dose vial) medications used. After the completion of the procedure, all disposable equipment used was discarded in the proper designated medical waste containers. Local Anesthesia: Protocol guidelines were followed. The patient was positioned over the fluoroscopy table. The area was prepped in the usual manner. The time-out was completed. The target area was identified using fluoroscopy. A 12-in long, straight, sterile  hemostat was used with fluoroscopic guidance to locate the targets for each level blocked. Once located, the skin was marked with an approved surgical skin marker. Once all sites were marked, the skin (epidermis, dermis, and hypodermis), as well as deeper tissues (fat, connective tissue and muscle) were infiltrated with a small  amount of a short-acting local anesthetic, loaded on a 10cc syringe with a 25G, 1.5-in  Needle. An appropriate amount of time was allowed for local anesthetics to take effect before proceeding to the next step. Local Anesthetic: Lidocaine 2.0% The unused portion of the local anesthetic was discarded in the proper designated containers.  Technical explanation of process:  L2 Medial Branch Nerve Block (MBB): The target area for the L2 medial branch is at the junction of the postero-lateral aspect of the superior articular process and the superior, posterior, and medial edge of the transverse process of L3. Under fluoroscopic guidance, a Quincke needle was inserted until contact was made with os over the superior postero-lateral aspect of the pedicular shadow (target area). After negative aspiration for blood, 1 mL of the nerve block solution was injected without difficulty or complication. The needle was removed intact. L3 Medial Branch Nerve Block (MBB): The target area for the L3 medial branch is at the junction of the postero-lateral aspect of the superior articular process and the superior, posterior, and medial edge of the transverse process of L4. Under fluoroscopic guidance, a Quincke needle was inserted until contact was made with os over the superior postero-lateral aspect of the pedicular shadow (target area). After negative aspiration for blood, 1mL of the nerve block solution was injected without difficulty or complication. The needle was removed intact. L4 Medial Branch Nerve Block (MBB): The target area for the L4 medial branch is at the junction of the postero-lateral  aspect of the superior articular process and the superior, posterior, and medial edge of the transverse process of L5. Under fluoroscopic guidance, a Quincke needle was inserted until contact was made with os over the superior postero-lateral aspect of the pedicular shadow (target area). After negative aspiration for blood,52mL of the nerve block solution was injected without difficulty or complication. The needle was removed intact. L5 Medial Branch Nerve Block (MBB): The target area for the L5 medial branch is at the junction of the postero-lateral aspect of the superior articular process and the superior, posterior, and medial edge of the sacral ala. Under fluoroscopic guidance, a Quincke needle was inserted until contact was made with os over the superior postero-lateral aspect of the pedicular shadow (target area). After negative aspiration for blood, 1 mL of the nerve block solution was injected without difficulty or complication. The needle was removed intact.  Procedural Needles: 22-gauge, 3.5-inch, Quincke needles used for all levels. Nerve block solution: 10 cc solution made of 9 cc of 0.2% ropivacaine, 1 cc of Decadron 10 mg/cc.  1 cc injected at each level above bilaterally.  The unused portion of the solution was discarded in the proper designated containers.  Once the entire procedure was completed, the treated area was cleaned, making sure to leave some of the prepping solution back to take advantage of its long term bactericidal properties.   Illustration of the posterior view of the lumbar spine and the posterior neural structures. Laminae of L2 through S1 are labeled. DPRL5, dorsal primary ramus of L5; DPRS1, dorsal primary ramus of S1; DPR3, dorsal primary ramus of L3; FJ, facet (zygapophyseal) joint L3-L4; I, inferior articular process of L4; LB1, lateral branch of dorsal primary ramus of L1; IAB, inferior articular branches from L3 medial branch (supplies L4-L5 facet joint); IBP,  intermediate branch plexus; MB3, medial branch of dorsal primary ramus of L3; NR3, third lumbar nerve root; S, superior articular process of L5; SAB, superior articular branches from L4 (supplies L4-5 facet joint  also); TP3, transverse process of L3.  Vitals:   01/10/18 0950 01/10/18 0955 01/10/18 1000 01/10/18 1007  BP: (!) 178/81 (!) 162/84 (!) 167/86 (!) 159/80  Pulse: 75 73 75 72  Resp: 17 18 16 14   Temp:      TempSrc:      SpO2: 97% 97% 97% 98%  Weight:      Height:         Start Time: 0939 hrs. End Time: 1000 hrs.  Imaging Guidance (Spinal):          Type of Imaging Technique: Fluoroscopy Guidance (Spinal) Indication(s): Assistance in needle guidance and placement for procedures requiring needle placement in or near specific anatomical locations not easily accessible without such assistance. Exposure Time: Please see nurses notes. Contrast: None used. Fluoroscopic Guidance: I was personally present during the use of fluoroscopy. "Tunnel Vision Technique" used to obtain the best possible view of the target area. Parallax error corrected before commencing the procedure. "Direction-depth-direction" technique used to introduce the needle under continuous pulsed fluoroscopy. Once target was reached, antero-posterior, oblique, and lateral fluoroscopic projection used confirm needle placement in all planes. Images permanently stored in EMR. Interpretation: No contrast injected. I personally interpreted the imaging intraoperatively. Adequate needle placement confirmed in multiple planes. Permanent images saved into the patient's record.  Antibiotic Prophylaxis:   Anti-infectives (From admission, onward)   None     Indication(s): None identified  Post-operative Assessment:  Post-procedure Vital Signs:  Pulse/HCG Rate: 72  Temp: 98.2 F (36.8 C) Resp: 14 BP: (!) 159/80 SpO2: 98 %  EBL: None  Complications: No immediate post-treatment complications observed by team, or reported  by patient.  Note: The patient tolerated the entire procedure well. A repeat set of vitals were taken after the procedure and the patient was kept under observation following institutional policy, for this type of procedure. Post-procedural neurological assessment was performed, showing return to baseline, prior to discharge. The patient was provided with post-procedure discharge instructions, including a section on how to identify potential problems. Should any problems arise concerning this procedure, the patient was given instructions to immediately contact us, at any time, without hesitation. In any case, we plan to contact the patient by telephone for a follow-up status report regarding this interventional procedure.  Comments:  No additional relevant information. 5 out of 5 strength bilateral lower extremity: Plantar flexion, dorsiflexion, knee flexion, knee extension.  Plan of Care    Imaging Orders     DG C-Arm 1-60 Min-No Report Procedure Orders    No procedure(s) ordered today    Medications ordered for procedure: Meds ordered this encounter  Medications  . lactated ringers infusion 1,000 mL  . fentaNYL (SUBLIMAZE) injection 25-100 mcg    Make sure Narcan is available in the pyxis when using this medication. In the event of respiratory depression (RR< 8/min): Titrate NARCAN (naloxone) in increments of 0.1 to 0.2 mg IV at 2-3 minute intervals, until desired degree of reversal.  . ropivacaine (PF) 2 mg/mL (0.2%) (NAROPIN) injection 10 mL  . lidocaine (XYLOCAINE) 2 % (with pres) injection 400 mg  . dexamethasone (DECADRON) injection 10 mg  . dexamethasone (DECADRON) injection 10 mg  . ropivacaine (PF) 2 mg/mL (0.2%) (NAROPIN) injection 1 mL  . diazepam (VALIUM) tablet 5 mg   Medications administered: We administered ropivacaine (PF) 2 mg/mL (0.2%), lidocaine, dexamethasone, dexamethasone, and ropivacaine (PF) 2 mg/mL (0.2%).  See the medical record for exact dosing, route, and  time of administration.  Disposition: Discharge home  Discharge Date & Time: 01/10/2018; 1010 hrs.   Physician-requested Follow-up: Return in about 4 weeks (around 02/07/2018) for Post Procedure Evaluation.  Future Appointments  Date Time Provider Department Center  01/31/2018  2:40 PM Erasmo Downer, MD [facility] None  02/13/2018  1:45 PM Edward Jolly, MD [facility] None  06/04/2018 10:40 AM [facility] ADVIS[facility] None   Primary Care Physician: Erasmo Downer, MD Location: [facility] Note by: Edward Jolly, MD Date: 01/10/2018; Time: 1:06 PM  Disclaimer:  Medicine is not an exact science. The only guarantee in medicine is that nothing is guaranteed. It is important to note that the decision to proceed with this intervention was based on the information collected from the patient. The Data and conclusions were drawn from the patient's questionnaire, the interview, and the physical examination. Because the information was provided in large part by the patient, it cannot be guaranteed that it has not been purposely or unconsciously manipulated. Every effort has been made to obtain as much relevant data as possible for this evaluation. It is important to note that the conclusions that lead to this procedure are derived in large part from the available data. Always take into account that the treatment will also be dependent on availability of resources and existing treatment guidelines, considered by other Pain Management Practitioners as being common knowledge and practice, at the time of the intervention. For Medico-Legal purposes, it is also important to point out that variation in procedural techniques and pharmacological choices are the acceptable norm. The indications, contraindications, technique, and results of the above procedure should only be interpreted and judged by a Board-Certified Interventional Pain Specialist with extensive familiarity and  expertise in the same exact procedure and technique.

## 2018-01-10 NOTE — Patient Instructions (Signed)
____________________________________________________________________________________________  Post-Procedure Discharge Instructions  Instructions:  Apply ice: Fill a plastic sandwich bag with crushed ice. Cover it with a small towel and apply to injection site. Apply for 15 minutes then remove x 15 minutes. Repeat sequence on day of procedure, until you go to bed. The purpose is to minimize swelling and discomfort after procedure.  Apply heat: Apply heat to procedure site starting the day following the procedure. The purpose is to treat any soreness and discomfort from the procedure.  Food intake: Start with clear liquids (like water) and advance to regular food, as tolerated.   Physical activities: Keep activities to a minimum for the first 8 hours after the procedure.   Driving: If you have received any sedation, you are not allowed to drive for 24 hours after your procedure.  Blood thinner: Restart your blood thinner 6 hours after your procedure. (Only for those taking blood thinners)  Insulin: As soon as you can eat, you may resume your normal dosing schedule. (Only for those taking insulin)  Infection prevention: Keep procedure site clean and dry.  Post-procedure Pain Diary: Extremely important that this be done correctly and accurately. Recorded information will be used to determine the next step in treatment.  Pain evaluated is that of treated area only. Do not include pain from an untreated area.  Complete every hour, on the hour, for the initial 8 hours. Set an alarm to help you do this part accurately.  Do not go to sleep and have it completed later. It will not be accurate.  Follow-up appointment: Keep your follow-up appointment after the procedure. Usually 2 weeks for most procedures. (6 weeks in the case of radiofrequency.) Bring you pain diary.   Expect:  From numbing medicine (AKA: Local Anesthetics): Numbness or decrease in pain.  Onset: Full effect within 15  minutes of injected.  Duration: It will depend on the type of local anesthetic used. On the average, 1 to 8 hours.   From steroids: Decrease in swelling or inflammation. Once inflammation is improved, relief of the pain will follow.  Onset of benefits: Depends on the amount of swelling present. The more swelling, the longer it will take for the benefits to be seen. In some cases, up to 10 days.  Duration: Steroids will stay in the system x 2 weeks. Duration of benefits will depend on multiple posibilities including persistent irritating factors.  Occasional side-effects: Facial flushing (red, warm cheeks) , cramps (if present, drink Gatorade and take over-the-counter Magnesium 450-500 mg once to twice a day).  From procedure: Some discomfort is to be expected once the numbing medicine wears off. This should be minimal if ice and heat are applied as instructed.  Call if:  You experience numbness and weakness that gets worse with time, as opposed to wearing off.  New onset bowel or bladder incontinence. (This applies to Spinal procedures only)  Emergency Numbers:  Durning business hours (Monday - Thursday, 8:00 AM - 4:00 PM) (Friday, 9:00 AM - 12:00 Noon): [phone]  After hours: [phone] ____________________________________________________________________________________________   Facet Joint Block The facet joints connect the bones of the spine (vertebrae). They make it possible for you to bend, twist, and make other movements with your spine. They also keep you from bending too far, twisting too far, and making other excessive movements. A facet joint block is a procedure where a numbing medicine (anesthetic) is injected into a facet joint. Often, a type of anti-inflammatory medicine called a steroid is also  injected. A facet joint block may be done to diagnose neck or back pain. If the pain gets better after a facet joint block, it means the pain is probably coming from the  facet joint. If the pain does not get better, it means the pain is probably not coming from the facet joint. A facet joint block may also be done to relieve neck or back pain caused by an inflamed facet joint. A facet joint block is only done to relieve pain if the pain does not improve with other methods, such as medicine, exercise programs, and physical therapy. Tell a health care provider about:  Any allergies you have.  All medicines you are taking, including vitamins, herbs, eye drops, creams, and over-the-counter medicines.  Any problems you or family members have had with anesthetic medicines.  Any blood disorders you have.  Any surgeries you have had.  Any medical conditions you have.  Whether you are pregnant or may be pregnant. What are the risks? Generally, this is a safe procedure. However, problems may occur, including:  Bleeding.  Injury to a nerve near the injection site.  Pain at the injection site.  Weakness or numbness in areas controlled by nerves near the injection site.  Infection.  Temporary fluid retention.  Allergic reactions to medicines or dyes.  Injury to other structures or organs near the injection site. What happens before the procedure?  Follow instructions from your health care provider about eating or drinking restrictions.  Ask your health care provider about: ? Changing or stopping your regular medicines. This is especially important if you are taking diabetes medicines or blood thinners. ? Taking medicines such as aspirin and ibuprofen. These medicines can thin your blood. Do not take these medicines before your procedure if your health care provider instructs you not to.  Do not take any new dietary supplements or medicines without asking your health care provider first.  Plan to have someone take you home after the procedure. What happens during the procedure?   You may need to remove your clothing and dress in an open-back  gown.  The procedure will be done while you are lying on an X-Blackstock table. You will most likely be asked to lie on your stomach, but you may be asked to lie in a different position if an injection will be made in your neck.  Machines will be used to monitor your oxygen levels, heart rate, and blood pressure.  If an injection will be made in your neck, an IV tube will be inserted into one of your veins. Fluids and medicine will flow directly into your body through the IV tube.  The area over the facet joint where the injection will be made will be cleaned with soap. The surrounding skin will be covered with clean drapes.  A numbing medicine (local anesthetic) will be applied to your skin. Your skin may sting or burn for a moment.  A video X-Percifield machine (fluoroscopy) will be used to locate the joint. In some cases, a CT scan may be used.  A contrast dye may be injected into the facet joint area to help locate the joint.  When the joint is located, an anesthetic will be injected into the joint through the needle.  Your health care provider will ask you whether you feel pain relief. If you do feel relief, a steroid may be injected to provide pain relief for a longer period of time. If you do not feel relief or feel  only partial relief, additional injections of an anesthetic may be made in other facet joints.  The needle will be removed.  Your skin will be cleaned.  A bandage (dressing) will be applied over each injection site. The procedure may vary among health care providers and hospitals. What happens after the procedure?  You will be observed for 15-30 minutes before being allowed to go home. This information is not intended to replace advice given to you by your health care provider. Make sure you discuss any questions you have with your health care provider. Document Released: 06/01/2006 Document Revised: 01/12/2017 Document Reviewed: 10/06/2014 Elsevier Interactive Patient Education   2019 [employer].

## 2018-01-11 ENCOUNTER — Telehealth: Payer: Self-pay | Admitting: *Deleted

## 2018-01-11 NOTE — Telephone Encounter (Signed)
Attempted to call for post procedure follow-up. Message left. 

## 2018-01-30 ENCOUNTER — Ambulatory Visit: Payer: Self-pay | Admitting: Family Medicine

## 2018-01-31 ENCOUNTER — Ambulatory Visit: Payer: Self-pay | Admitting: Family Medicine

## 2018-02-12 ENCOUNTER — Encounter: Payer: Self-pay | Admitting: Family Medicine

## 2018-02-12 ENCOUNTER — Ambulatory Visit (INDEPENDENT_AMBULATORY_CARE_PROVIDER_SITE_OTHER): Payer: Medicare Other | Admitting: Family Medicine

## 2018-02-12 VITALS — BP 170/78 | HR 72 | Temp 98.3°F | Wt 142.6 lb

## 2018-02-12 DIAGNOSIS — E7800 Pure hypercholesterolemia, unspecified: Secondary | ICD-10-CM | POA: Diagnosis not present

## 2018-02-12 DIAGNOSIS — I10 Essential (primary) hypertension: Secondary | ICD-10-CM | POA: Diagnosis not present

## 2018-02-12 DIAGNOSIS — Z8673 Personal history of transient ischemic attack (TIA), and cerebral infarction without residual deficits: Secondary | ICD-10-CM

## 2018-02-12 MED ORDER — LOSARTAN POTASSIUM 100 MG PO TABS: 100.0000 mg | ORAL_TABLET | Freq: Every day | ORAL | 3 refills | Status: DC

## 2018-02-12 NOTE — Progress Notes (Signed)
Patient: Gina [name] Female    DOB: [birth year]   78 y.o.   MRN: [id number] Visit Date: 02/12/2018  Today's Provider: Shirlee Latch, MD   Chief Complaint  Patient presents with  . Hypertension   Subjective:    HPI  Hypertension, follow-up:  BP Readings from Last 3 Encounters:  02/12/18 (!) 170/78  01/10/18 (!) 159/80  12/12/17 (!) 149/78    She was last seen for hypertension 3 months ago.  BP at that visit was 122/72. Management changes since that visit include none. She reports good compliance with treatment. She is not having side effects.  She is not exercising. She is adherent to low salt diet.   Outside blood pressures are not being checked. She is experiencing none.  Patient denies chest pain, chest pressure/discomfort, fatigue, irregular heart beat, lower extremity edema, palpitations and tachypnea.   Cardiovascular risk factors include hypertension.  Use of agents associated with hypertension: none.     Weight trend: stable Wt Readings from Last 3 Encounters:  02/12/18 142 lb 9.6 oz (64.7 kg)  01/10/18 140 lb (63.5 kg)  12/12/17 140 lb (63.5 kg)    Current diet: well balanced  ------------------------------------------------------------------------    Allergies  Allergen Reactions  . Hydrochlorothiazide   . Chlorthalidone Nausea Only  . Lisinopril Cough  . Penicillins Rash     Current Outpatient Medications:  .  acetaminophen (TYLENOL) 325 MG tablet, Take by mouth every 6 (six) hours as needed for pain., Disp: , Rfl:  .  amLODipine (NORVASC) 5 MG tablet, Take 1 tablet (5 mg total) by mouth daily., Disp: 90 tablet, Rfl: 2 .  aspirin 325 MG tablet, Take 325 mg by mouth daily., Disp: , Rfl:  .  atorvastatin (LIPITOR) 40 MG tablet, Take 1 tablet (40 mg total) by mouth daily., Disp: 90 tablet, Rfl: 3 .  cetirizine (ZYRTEC) 10 MG tablet, Take 10 mg by mouth daily. Reported on 08/10/2015, Disp: , Rfl:  .  cholecalciferol (VITAMIN D) 1000 units  tablet, Take 1,000 Units by mouth daily., Disp: , Rfl:  .  losartan (COZAAR) 50 MG tablet, Take 1 tablet (50 mg total) by mouth daily., Disp: 30 tablet, Rfl: 3 .  metoprolol (LOPRESSOR) 100 MG tablet, Take 100 mg by mouth 2 (two) times daily., Disp: , Rfl:  .  mirabegron ER (MYRBETRIQ) 25 MG TB24 tablet, Take 1 tablet (25 mg total) by mouth daily., Disp: 30 tablet, Rfl: 3 .  ranitidine (ZANTAC) 150 MG tablet, Take 150 mg by mouth daily. , Disp: , Rfl:   Review of Systems  Constitutional: Negative.   HENT: Negative.   Respiratory: Negative.   Genitourinary: Negative.   Neurological: Negative.     Social History   Tobacco Use  . Smoking status: Former Smoker    Packs/day: 0.50    Years: 5.00    Pack years: 2.50    Last attempt to quit: 01/23/1970    Years since quitting: 48.0  . Smokeless tobacco: Never Used  Substance Use Topics  . Alcohol use: Yes    Comment: rare - 1 drink      Objective:   BP (!) 170/78 (BP Location: Left Arm, Patient Position: Sitting, Cuff Size: Normal)   Pulse 72   Temp 98.3 F (36.8 C) (Oral)   Wt 142 lb 9.6 oz (64.7 kg)   BMI 24.48 kg/m  Vitals:   02/12/18 1558  BP: (!) 170/78  Pulse: 72  Temp: 98.3 F (36.8  C)  TempSrc: Oral  Weight: 142 lb 9.6 oz (64.7 kg)     Physical Exam Vitals signs reviewed.  Constitutional:      General: She is not in acute distress.    Appearance: Normal appearance.  HENT:     Head: Normocephalic and atraumatic.     Right Ear: External ear normal.     Left Ear: External ear normal.     Nose: Nose normal.     Mouth/Throat:     Pharynx: Oropharynx is clear.  Eyes:     General: No scleral icterus.    Conjunctiva/sclera: Conjunctivae normal.  Neck:     Musculoskeletal: Neck supple.  Cardiovascular:     Rate and Rhythm: Normal rate and regular rhythm.     Pulses: Normal pulses.     Heart sounds: Normal heart sounds. No murmur.  Pulmonary:     Effort: Pulmonary effort is normal. No respiratory distress.      Breath sounds: Normal breath sounds. No wheezing or rhonchi.  Musculoskeletal:     Right lower leg: No edema.     Left lower leg: No edema.  Lymphadenopathy:     Cervical: No cervical adenopathy.  Skin:    General: Skin is warm and dry.     Capillary Refill: Capillary refill takes less than 2 seconds.     Findings: No rash.  Neurological:     Mental Status: She is alert and oriented to person, place, and time. Mental status is at baseline.  Psychiatric:        Mood and Affect: Mood normal.        Behavior: Behavior normal.         Assessment & Plan   Problem List Items Addressed This Visit      Cardiovascular and Mediastinum   Hypertension, essential - Primary    Uncontrolled Remain concerned about compliance with medications Continue metoprolol and amlodipine at current doses Higher doses of amlodipine caused leg swelling in the past Will increase losartan dose      Relevant Medications   losartan (COZAAR) 100 MG tablet     Other   History of CVA (cerebrovascular accident)    Again discussed importance of secondary prvention with patient Encouraged her to take statin and can take CoQ10 with it to decrease risk of myalgias She also needs good HTN control      Hypercholesteremia    Patient not taking statin We discussed importance She states she will try it and take CoQ10 to decrease risk of myalgias also as this is what she is scared of      Relevant Medications   losartan (COZAAR) 100 MG tablet       Return in about 6 weeks (around 03/26/2018) for BP f/u.   The entirety of the information documented in the History of Present Illness, Review of Systems and Physical Exam were personally obtained by me. Portions of this information were initially documented by [facility], CMA and reviewed by me for thoroughness and accuracy.    Erasmo DownerBacigalupo, Temeca Somma M, MD, MPH [facility] 02/13/2018 11:58 AM

## 2018-02-12 NOTE — Patient Instructions (Addendum)
Take CoQ10 100-200 mg daily with your Atorvastatin to avoid leg pain   Lumbar spondylosis (back problem)    Blood Pressure Record Sheet To take your blood pressure, you will need a blood pressure machine. You can buy a blood pressure machine (blood pressure monitor) at your clinic, drug store, or online. When choosing one, consider:  An automatic monitor that has an arm cuff.  A cuff that wraps snugly around your upper arm. You should be able to fit only one finger between your arm and the cuff.  A device that stores blood pressure reading results.  Do not choose a monitor that measures your blood pressure from your wrist or finger. Follow your health care provider's instructions for how to take your blood pressure. To use this form:  Get one reading in the morning (a.m.) before you take any medicines.  Get one reading in the evening (p.m.) before supper.  Take at least 2 readings with each blood pressure check. This makes sure the results are correct. Wait 1-2 minutes between measurements.  Write down the results in the spaces on this form.  Repeat this once a week, or as told by your health care provider.  Make a follow-up appointment with your health care provider to discuss the results. Blood pressure log Date: _______________________  a.m. _____________________(1st reading) _____________________(2nd reading)  p.m. _____________________(1st reading) _____________________(2nd reading) Date: _______________________  a.m. _____________________(1st reading) _____________________(2nd reading)  p.m. _____________________(1st reading) _____________________(2nd reading) Date: _______________________  a.m. _____________________(1st reading) _____________________(2nd reading)  p.m. _____________________(1st reading) _____________________(2nd reading) Date: _______________________  a.m. _____________________(1st reading) _____________________(2nd reading)  p.m.  _____________________(1st reading) _____________________(2nd reading) Date: _______________________  a.m. _____________________(1st reading) _____________________(2nd reading)  p.m. _____________________(1st reading) _____________________(2nd reading) This information is not intended to replace advice given to you by your health care provider. Make sure you discuss any questions you have with your health care provider. Document Released: 10/09/2002 Document Revised: 01/10/2017 Document Reviewed: 01/10/2017 Elsevier Interactive Patient Education  2019 [employer].

## 2018-02-13 ENCOUNTER — Ambulatory Visit: Payer: Medicare Other | Admitting: Student in an Organized Health Care Education/Training Program

## 2018-02-13 NOTE — Assessment & Plan Note (Signed)
Again discussed importance of secondary prvention with patient Encouraged her to take statin and can take CoQ10 with it to decrease risk of myalgias She also needs good HTN control

## 2018-02-13 NOTE — Assessment & Plan Note (Signed)
Patient not taking statin We discussed importance She states she will try it and take CoQ10 to decrease risk of myalgias also as this is what she is scared of

## 2018-02-13 NOTE — Assessment & Plan Note (Signed)
Uncontrolled Remain concerned about compliance with medications Continue metoprolol and amlodipine at current doses Higher doses of amlodipine caused leg swelling in the past Will increase losartan dose

## 2018-02-19 DIAGNOSIS — R002 Palpitations: Secondary | ICD-10-CM | POA: Diagnosis not present

## 2018-02-19 DIAGNOSIS — I10 Essential (primary) hypertension: Secondary | ICD-10-CM | POA: Diagnosis not present

## 2018-02-28 ENCOUNTER — Ambulatory Visit: Payer: Medicare Other | Admitting: Student in an Organized Health Care Education/Training Program

## 2018-03-08 ENCOUNTER — Other Ambulatory Visit: Payer: Self-pay | Admitting: Family Medicine

## 2018-03-08 DIAGNOSIS — I10 Essential (primary) hypertension: Secondary | ICD-10-CM

## 2018-03-12 ENCOUNTER — Other Ambulatory Visit: Payer: Self-pay

## 2018-03-12 ENCOUNTER — Ambulatory Visit
Payer: Medicare Other | Attending: Student in an Organized Health Care Education/Training Program | Admitting: Student in an Organized Health Care Education/Training Program

## 2018-03-12 ENCOUNTER — Encounter: Payer: Self-pay | Admitting: Student in an Organized Health Care Education/Training Program

## 2018-03-12 VITALS — BP 164/71 | HR 77 | Temp 98.4°F | Resp 16 | Ht 64.0 in | Wt 142.0 lb

## 2018-03-12 DIAGNOSIS — M47816 Spondylosis without myelopathy or radiculopathy, lumbar region: Secondary | ICD-10-CM

## 2018-03-12 DIAGNOSIS — M47818 Spondylosis without myelopathy or radiculopathy, sacral and sacrococcygeal region: Secondary | ICD-10-CM | POA: Diagnosis not present

## 2018-03-12 DIAGNOSIS — G894 Chronic pain syndrome: Secondary | ICD-10-CM | POA: Diagnosis not present

## 2018-03-12 NOTE — Progress Notes (Signed)
Patient's Name: Gina [name]  MRN: [id number]  Referring Provider: Virginia Crews, MD  DOB: 03-27-1940  PCP: Gina Crews, MD  DOS: 03/12/2018  Note by: Gina Santa, MD  Service setting: Ambulatory outpatient  Specialty: Interventional Pain Management  Location: [facility] (AMB) Pain Management Facility    Patient type: Established   Primary Reason(s) for Visit: Encounter for post-procedure evaluation of chronic illness with mild to moderate exacerbation CC: Back Pain (lower)  HPI  Gina [name] is a 78 y.o. year old, female patient, who comes today for a post-procedure evaluation. She has Allergic rhinitis; Appendicular ataxia; Acid reflux; History of CVA (cerebrovascular accident); Hypercholesteremia; Mononeuropathy of lower extremity; Disorder of mitral valve; Nonrheumatic tricuspid valve disorder; Adnexal mass; Avitaminosis D; Impacted cerumen of right ear; Arthritis; Hypertension, essential; Long-term use of high-risk medication; Chronic pain syndrome; CKD (chronic kidney disease) stage 3, GFR 30-59 ml/min (HCC); Dyspnea on exertion; Overactive bladder; SI joint arthritis; Lumbar spondylosis; and Lumbar facet arthropathy on their problem list. Her primarily concern today is the Back Pain (lower)  Pain Assessment: Location: Lower Back Radiating: left upper leg Onset: More than a month ago Duration: Chronic pain Quality: (bruised) Severity: 8 /10 (subjective, self-reported pain score)  Note: Reported level is inconsistent with clinical observations. Clinically the patient looks like a 2/10 A 2/10 is viewed as "Mild to Moderate" and described as noticeable and distracting. Impossible to hide from other people. More frequent flare-ups. Still possible to adapt and function close to normal. It can be very annoying and may have occasional stronger flare-ups. With discipline, patients may get used to it and adapt. Information on the proper use of the pain scale provided to the patient today. When using  our objective Pain Scale, levels between 6 and 10/10 are said to belong in an emergency room, as it progressively worsens from a 6/10, described as severely limiting, requiring emergency care not usually available at an outpatient pain management facility. At a 6/10 level, communication becomes difficult and requires great effort. Assistance to reach the emergency department may be required. Facial flushing and profuse sweating along with potentially dangerous increases in heart rate and blood pressure will be evident. Effect on ADL:   Timing: Intermittent Modifying factors: sitting BP: (!) 164/71  HR: 77  Gina [name] comes in today for post-procedure evaluation.  Further details on both, my assessment(s), as well as the proposed treatment plan, please see below.  Post-Procedure Assessment  01/10/2018 Procedure: L2, L3, L4, L5 diagnostic facet medial branch nerve block #1, bilateral Pre-procedure pain score:  8/10 Post-procedure pain score: 0/10         Influential Factors: BMI: 24.37 kg/m Intra-procedural challenges: None observed.         Assessment challenges: None detected.              Reported side-effects: None.        Post-procedural adverse reactions or complications: None reported         Sedation: Please see nurses note. When no sedatives are used, the analgesic levels obtained are directly associated to the effectiveness of the local anesthetics. However, when sedation is provided, the level of analgesia obtained during the initial 1 hour following the intervention, is believed to be the result of a combination of factors. These factors may include, but are not limited to: 1. The effectiveness of the local anesthetics used. 2. The effects of the analgesic(s) and/or anxiolytic(s) used. 3. The degree of discomfort experienced by the patient at the  time of the procedure. 4. The patients ability and reliability in recalling and recording the events. 5. The presence and influence of  possible secondary gains and/or psychosocial factors. Reported result: Relief experienced during the 1st hour after the procedure: 100 % (Ultra-Short Term Relief)            Interpretative annotation: Clinically appropriate result. Analgesia during this period is likely to be Local Anesthetic and/or IV Sedative (Analgesic/Anxiolytic) related.          Effects of local anesthetic: The analgesic effects attained during this period are directly associated to the localized infiltration of local anesthetics and therefore cary significant diagnostic value as to the etiological location, or anatomical origin, of the pain. Expected duration of relief is directly dependent on the pharmacodynamics of the local anesthetic used. Long-acting (4-6 hours) anesthetics used.  Reported result: Relief during the next 4 to 6 hour after the procedure: 100 % (Short-Term Relief)            Interpretative annotation: Clinically appropriate result. Analgesia during this period is likely to be Local Anesthetic-related.          Long-term benefit: Defined as the period of time past the expected duration of local anesthetics (1 hour for short-acting and 4-6 hours for long-acting). With the possible exception of prolonged sympathetic blockade from the local anesthetics, benefits during this period are typically attributed to, or associated with, other factors such as analgesic sensory neuropraxia, antiinflammatory effects, or beneficial biochemical changes provided by agents other than the local anesthetics.  Reported result: Extended relief following procedure: 0 % (Long-Term Relief)            Interpretative annotation: Clinically possible results. No benefit. No long-term benefit attained. Pain appears to be refractory to this treatment modality.          Current benefits: Defined as reported results that persistent at this point in time.   Analgesia: 0 %            Function: Back to baseline ROM: Back to  baseline Interpretative annotation: No benefit. Therapeutic failure. Results would argue against repeating therapy.          Interpretation: Results would suggest failure of therapy in achieving desired goal(s).                  Plan:  Please see "Plan of Care" for details.                Laboratory Chemistry  Inflammation Markers (CRP: Acute Phase) (ESR: Chronic Phase) Lab Results  Component Value Date   ESRSEDRATE 11 09/04/2014                         Rheumatology Markers Lab Results  Component Value Date   RF 8.7 09/04/2014                        Renal Function Markers Lab Results  Component Value Date   BUN 21 11/08/2017   CREATININE 0.96 11/08/2017   BCR 22 11/08/2017   GFRAA 66 11/08/2017   GFRNONAA 57 (L) 11/08/2017                             Hepatic Function Markers Lab Results  Component Value Date   AST 18 11/08/2017   ALT 22 11/08/2017   ALBUMIN 4.2 11/08/2017   ALKPHOS 73 11/08/2017  Electrolytes Lab Results  Component Value Date   NA 141 11/08/2017   K 4.0 11/08/2017   CL 102 11/08/2017   CALCIUM 9.7 11/08/2017                        Neuropathy Markers Lab Results  Component Value Date   HGBA1C 5.9 10/19/2012                        CNS Tests No results found for: COLORCSF, APPEARCSF, RBCCOUNTCSF, WBCCSF, POLYSCSF, LYMPHSCSF, EOSCSF, PROTEINCSF, GLUCCSF, JCVIRUS, CSFOLI, IGGCSF                      Bone Pathology Markers Lab Results  Component Value Date   VD25OH 30.5 11/08/2017                         Coagulation Parameters Lab Results  Component Value Date   INR 1.0 06/28/2011   LABPROT 13.2 06/28/2011   APTT 29.8 06/28/2011   PLT 179 11/08/2017                        Cardiovascular Markers Lab Results  Component Value Date   HGB 13.7 11/08/2017   HCT 40.5 11/08/2017                         CA Markers No results found for: CEA, CA125, LABCA2                      Endocrine Markers Lab Results   Component Value Date   TSH 0.654 09/04/2014                        Note: Lab results reviewed.  Recent Diagnostic Imaging Results  DG C-Arm 1-60 Min-No Report Fluoroscopy was utilized by the requesting physician.  No radiographic  interpretation.   Complexity Note: Imaging results reviewed. Results shared with Ms. Digilio, using Layman's terms.                         Meds   Current Outpatient Medications:  .  acetaminophen (TYLENOL) 325 MG tablet, Take by mouth every 6 (six) hours as needed for pain., Disp: , Rfl:  .  amLODipine (NORVASC) 5 MG tablet, TAKE 1 TABLET BY MOUTH ONCE DAILY, Disp: 90 tablet, Rfl: 3 .  aspirin 325 MG tablet, Take 325 mg by mouth daily., Disp: , Rfl:  .  atorvastatin (LIPITOR) 40 MG tablet, Take 1 tablet (40 mg total) by mouth daily., Disp: 90 tablet, Rfl: 3 .  cetirizine (ZYRTEC) 10 MG tablet, Take 10 mg by mouth daily. Reported on 08/10/2015, Disp: , Rfl:  .  cholecalciferol (VITAMIN D) 1000 units tablet, Take 1,000 Units by mouth daily., Disp: , Rfl:  .  losartan (COZAAR) 100 MG tablet, Take 1 tablet (100 mg total) by mouth daily., Disp: 90 tablet, Rfl: 3 .  metoprolol (LOPRESSOR) 100 MG tablet, Take 100 mg by mouth 2 (two) times daily., Disp: , Rfl:  .  mirabegron ER (MYRBETRIQ) 25 MG TB24 tablet, Take 1 tablet (25 mg total) by mouth daily., Disp: 30 tablet, Rfl: 3 .  ranitidine (ZANTAC) 150 MG tablet, Take 150 mg by mouth daily. , Disp: , Rfl:   ROS  Constitutional: Denies any  fever or chills Gastrointestinal: No reported hemesis, hematochezia, vomiting, or acute GI distress Musculoskeletal: Denies any acute onset joint swelling, redness, loss of ROM, or weakness Neurological: No reported episodes of acute onset apraxia, aphasia, dysarthria, agnosia, amnesia, paralysis, loss of coordination, or loss of consciousness  Allergies  Ms. Scantling is allergic to hydrochlorothiazide; chlorthalidone; lisinopril; and penicillins.  PFSH  Drug: Ms. Reisen  reports no  history of drug use. Alcohol:  reports current alcohol use. Tobacco:  reports that she quit smoking about 48 years ago. She has a 2.50 pack-year smoking history. She has never used smokeless tobacco. Medical:  has a past medical history of Abnormal blood sugar (08/03/2007), Hyperlipidemia, Hypertension (2010), and Mini stroke ([place]) (2010, 2014). Surgical: Ms. Tetro  has a past surgical history that includes Oophorectomy (2013); Hip surgery (Left, 2013); Cesarean section; Breast surgery (Left, 1982?); and Hernia repair (Left, 2014). Family: family history includes Congestive Heart Failure (age of onset: 4) in her mother; Heart disease (age of onset: 42) in her father; Heart failure (age of onset: 29) in her brother; Rheumatic fever in her father and sister.  Constitutional Exam  General appearance: Well nourished, well developed, and well hydrated. In no apparent acute distress Vitals:   03/12/18 1429  BP: (!) 164/71  Pulse: 77  Resp: 16  Temp: 98.4 F (36.9 C)  TempSrc: Oral  SpO2: 99%  Weight: 142 lb (64.4 kg)  Height: _0  (1.626 m)   BMI Assessment: Estimated body mass index is 24.37 kg/m as calculated from the following:   Height as of this encounter: _1  (1.626 m).   Weight as of this encounter: 142 lb (64.4 kg).  BMI interpretation table: BMI level Category Range association with higher incidence of chronic pain  <18 kg/m2 Underweight   18.5-24.9 kg/m2 Ideal body weight   25-29.9 kg/m2 Overweight Increased incidence by 20%  30-34.9 kg/m2 Obese (Class I) Increased incidence by 68%  35-39.9 kg/m2 Severe obesity (Class II) Increased incidence by 136%  >40 kg/m2 Extreme obesity (Class III) Increased incidence by 254%   Patient's current BMI Ideal Body weight  Body mass index is 24.37 kg/m. Ideal body weight: 54.7 kg (120 lb 9.5 oz) Adjusted ideal body weight: 58.6 kg (129 lb 2.5 oz)   BMI Readings from Last 4 Encounters:  03/12/18 24.37 kg/m  02/12/18 24.48 kg/m   01/10/18 24.03 kg/m  12/12/17 24.03 kg/m   Wt Readings from Last 4 Encounters:  03/12/18 142 lb (64.4 kg)  02/12/18 142 lb 9.6 oz (64.7 kg)  01/10/18 140 lb (63.5 kg)  12/12/17 140 lb (63.5 kg)  Psych/Mental status: Alert, oriented x 3 (person, place, & time)       Eyes: PERLA Respiratory: No evidence of acute respiratory distress  Cervical Spine Area Exam  Skin & Axial Inspection: No masses, redness, edema, swelling, or associated skin lesions Alignment: Symmetrical Functional ROM: Unrestricted ROM      Stability: No instability detected Muscle Tone/Strength: Functionally intact. No obvious neuro-muscular anomalies detected. Sensory (Neurological): Unimpaired Palpation: No palpable anomalies               Thoracic Spine Area Exam  Skin & Axial Inspection: No masses, redness, or swelling Alignment: Symmetrical Functional ROM: Unrestricted ROM Stability: No instability detected Muscle Tone/Strength: Functionally intact. No obvious neuro-muscular anomalies detected. Sensory (Neurological): Unimpaired Muscle strength & Tone: No palpable anomalies  Lumbar Spine Area Exam  Skin & Axial Inspection: No masses, redness, or swelling Alignment: Symmetrical Functional ROM: Pain restricted  ROM affecting primarily the left Stability: No instability detected Muscle Tone/Strength: Functionally intact. No obvious neuro-muscular anomalies detected. Sensory (Neurological): Musculoskeletal pain pattern Palpation: No palpable anomalies       Provocative Tests: Hyperextension/rotation test: (+) due to pain. Lumbar quadrant test (Kemp's test): deferred today       Lateral bending test: deferred today       Patrick's Maneuver: (+) for bilateral S-I arthralgia            Left greater than right FABER* test: (+) for left-sided S-I arthralgia             S-I anterior distraction/compression test: deferred today         S-I lateral compression test: deferred today         S-I Thigh-thrust  test: deferred today         S-I Gaenslen's test: deferred today         *(Flexion, ABduction and External Rotation)  Gait & Posture Assessment  Ambulation: Patient ambulates using a cane Gait: Significantly limited. Dependent on assistive device to ambulate Posture: Difficulty standing up straight, due to pain   Lower Extremity Exam    Side: Right lower extremity  Side: Left lower extremity  Stability: No instability observed          Stability: No instability observed          Skin & Extremity Inspection: Skin color, temperature, and hair growth are WNL. No peripheral edema or cyanosis. No masses, redness, swelling, asymmetry, or associated skin lesions. No contractures.  Skin & Extremity Inspection: Skin color, temperature, and hair growth are WNL. No peripheral edema or cyanosis. No masses, redness, swelling, asymmetry, or associated skin lesions. No contractures.  Functional ROM: Unrestricted ROM                  Functional ROM: Decreased ROM for hip and knee joints          Muscle Tone/Strength: Functionally intact. No obvious neuro-muscular anomalies detected.  Muscle Tone/Strength: Functionally intact. No obvious neuro-muscular anomalies detected.  Sensory (Neurological): Neurogenic pain pattern        Sensory (Neurological): Neurogenic pain pattern        DTR: Patellar: 0: absent Achilles: deferred today Plantar: deferred today  DTR: Patellar: 0: absent Achilles: deferred today Plantar: deferred today  Palpation: No palpable anomalies  Palpation: No palpable anomalies   Assessment   Status Diagnosis  Persistent Persistent Persistent 1. SI joint arthritis (L>R)   2. Lumbar spondylosis   3. Lumbar facet arthropathy   4. Chronic pain syndrome      Updated Problems: Problem  Si Joint Arthritis  Lumbar Spondylosis  Lumbar Facet Arthropathy    Plan of Care   Diagnostic bilateral sacroiliac joint injection  Lab-work, procedure(s), and/or referral(s): Orders Placed  This Encounter  Procedures  . SACROILIAC JOINT INJECTION    Interventional history: Has tried multiple lumbar epidural steroid injections at previous pain clinic without any significant benefit.  Had diagnostic lumbar facet medial branch nerve blocks bilaterally on 01/10/2018 without any significant benefit.  History of stroke in the past.  Provider-requested follow-up: Return in about 2 weeks (around 03/26/2018) for Procedure.  Time Note: Greater than 50% of the 25 minute(s) of face-to-face time spent with Ms. Burnham, was spent in counseling/coordination of care regarding: the appropriate use of the pain scale, Ms. Silos's primary cause of pain, the treatment plan, treatment alternatives, the risks and possible complications of proposed treatment, going over  the informed consent, the results, interpretation and significance of  her recent diagnostic interventional treatment(s), realistic expectations and the goals of pain management (increased in functionality).  Future Appointments  Date Time Provider [place]  03/21/2018  9:30 AM Gina Santa, MD [facility] None  03/28/2018  3:40 PM Brita Romp, Dionne Bucy, MD [facility] None  06/04/2018 10:40 AM [facility] HEALTH ADVISOR [facility] None    Primary Care Physician: Gina Crews, MD Location: [facility] Note by: Gina [name], M.D Date: 03/12/2018; Time: 3:16 PM  Patient Instructions  Decrease aspirin dose to 81 mg/day 7 days before procedure.  ____________________________________________________________________________________________  Preparing for your procedure (without sedation)  Instructions: . Oral Intake: Do not eat or drink anything for at least 3 hours prior to your procedure. . Transportation: Unless otherwise stated by your physician, you may drive yourself after the procedure. . Blood Pressure Medicine: Take your blood pressure medicine with a sip of water the morning of the procedure. . Blood  thinners: Notify our staff if you are taking any blood thinners. Depending on which one you take, there will be specific instructions on how and when to stop it. . Diabetics on insulin: Notify the staff so that you can be scheduled 1st case in the morning. If your diabetes requires high dose insulin, take only  of your normal insulin dose the morning of the procedure and notify the staff that you have done so. . Preventing infections: Shower with an antibacterial soap the morning of your procedure.  . Build-up your immune system: Take 1000 mg of Vitamin C with every meal (3 times a day) the day prior to your procedure. Marland Kitchen Antibiotics: Inform the staff if you have a condition or reason that requires you to take antibiotics before dental procedures. . Pregnancy: If you are pregnant, call and cancel the procedure. . Sickness: If you have a cold, fever, or any active infections, call and cancel the procedure. . Arrival: You must be in the facility at least 30 minutes prior to your scheduled procedure. . Children: Do not bring any children with you. . Dress appropriately: Bring dark clothing that you would not mind if they get stained. . Valuables: Do not bring any jewelry or valuables.  Procedure appointments are reserved for interventional treatments only. Marland Kitchen No Prescription Refills. . No medication changes will be discussed during procedure appointments. . No disability issues will be discussed.  Reasons to call and reschedule or cancel your procedure: (Following these recommendations will minimize the risk of a serious complication.) . Surgeries: Avoid having procedures within 2 weeks of any surgery. (Avoid for 2 weeks before or after any surgery). . Flu Shots: Avoid having procedures within 2 weeks of a flu shots or . (Avoid for 2 weeks before or after immunizations). . Barium: Avoid having a procedure within 7-10 days after having had a radiological study involving the use of radiological  contrast. (Myelograms, Barium swallow or enema study). . Heart attacks: Avoid any elective procedures or surgeries for the initial 6 months after a "Myocardial Infarction" (Heart Attack). . Blood thinners: It is imperative that you stop these medications before procedures. Let us know if you if you take any blood thinner.  . Infection: Avoid procedures during or within two weeks of an infection (including chest colds or gastrointestinal problems). Symptoms associated with infections include: Localized redness, fever, chills, night sweats or profuse sweating, burning sensation when voiding, cough, congestion, stuffiness, runny nose, sore throat, diarrhea, nausea, vomiting, cold or Flu  symptoms, recent or current infections. It is specially important if the infection is over the area that we intend to treat. Marland Kitchen Heart and lung problems: Symptoms that may suggest an active cardiopulmonary problem include: cough, chest pain, breathing difficulties or shortness of breath, dizziness, ankle swelling, uncontrolled high or unusually low blood pressure, and/or palpitations. If you are experiencing any of these symptoms, cancel your procedure and contact your primary care physician for an evaluation.  Remember:  Regular Business hours are:  Monday to Thursday 8:00 AM to 4:00 PM  Provider's Schedule: Milinda Pointer, MD:  Procedure days: Tuesday and Thursday 7:30 AM to 4:00 PM  Gina Santa, MD:  Procedure days: Monday and Wednesday 7:30 AM to 4:00 PM ____________________________________________________________________________________________    Sacroiliac (SI) Joint Injection Patient Information  Description: The sacroiliac joint connects the scrum (very low back and tailbone) to the ilium (a pelvic bone which also forms half of the hip joint).  Normally this joint experiences very little motion.  When this joint becomes inflamed or unstable low back and or hip and pelvis pain may result.  Injection of  this joint with local anesthetics (numbing medicines) and steroids can provide diagnostic information and reduce pain.  This injection is performed with the aid of x-Alessio guidance into the tailbone area while you are lying on your stomach.   You may experience an electrical sensation down the leg while this is being done.  You may also experience numbness.  We also may ask if we are reproducing your normal pain during the injection.  Conditions which may be treated SI injection:   Low back, buttock, hip or leg pain  Preparation for the Injection:  1. Do not eat any solid food or dairy products within 8 hours of your appointment.  2. You may drink clear liquids up to 3 hours before appointment.  Clear liquids include water, black coffee, juice or soda.  No milk or cream please. 3. You may take your regular medications, including pain medications with a sip of water before your appointment.  Diabetics should hold regular insulin (if take separately) and take 1/2 normal NPH dose the morning of the procedure.  Carry some sugar containing items with you to your appointment. 4. A driver must accompany you and be prepared to drive you home after your procedure. 5. Bring all of your current medications with you. 6. An IV may be inserted and sedation may be given at the discretion of the physician. 7. A blood pressure cuff, EKG and other monitors will often be applied during the procedure.  Some patients may need to have extra oxygen administered for a short period.  8. You will be asked to provide medical information, including your allergies, prior to the procedure.  We must know immediately if you are taking blood thinners (like Coumadin/Warfarin) or if you are allergic to IV iodine contrast (dye).  We must know if you could possible be pregnant.  Possible side effects:   Bleeding from needle site  Infection (rare, may require surgery)  Nerve injury (rare)  Numbness & tingling (temporary)  A  brief convulsion or seizure  Light-headedness (temporary)  Pain at injection site (several days)  Decreased blood pressure (temporary)  Weakness in the leg (temporary)   Call if you experience:   New onset weakness or numbness of an extremity below the injection site that last more than 8 hours.  Hives or difficulty breathing ( go to the emergency room)  Inflammation or drainage at  the injection site  Any new symptoms which are concerning to you  Please note:  Although the local anesthetic injected can often make your back/ hip/ buttock/ leg feel good for several hours after the injections, the pain will likely return.  It takes 3-7 days for steroids to work in the sacroiliac area.  You may not notice any pain relief for at least that one week.  If effective, we will often do a series of three injections spaced 3-6 weeks apart to maximally decrease your pain.  After the initial series, we generally will wait some months before a repeat injection of the same type.  If you have any questions, please call [phone] [facility]

## 2018-03-12 NOTE — Patient Instructions (Addendum)
Decrease aspirin dose to 81 mg/day 7 days before procedure.  ____________________________________________________________________________________________  Preparing for your procedure (without sedation)  Instructions: . Oral Intake: Do not eat or drink anything for at least 3 hours prior to your procedure. . Transportation: Unless otherwise stated by your physician, you may drive yourself after the procedure. . Blood Pressure Medicine: Take your blood pressure medicine with a sip of water the morning of the procedure. . Blood thinners: Notify our staff if you are taking any blood thinners. Depending on which one you take, there will be specific instructions on how and when to stop it. . Diabetics on insulin: Notify the staff so that you can be scheduled 1st case in the morning. If your diabetes requires high dose insulin, take only  of your normal insulin dose the morning of the procedure and notify the staff that you have done so. . Preventing infections: Shower with an antibacterial soap the morning of your procedure.  . Build-up your immune system: Take 1000 mg of Vitamin C with every meal (3 times a day) the day prior to your procedure. Marland Kitchen Antibiotics: Inform the staff if you have a condition or reason that requires you to take antibiotics before dental procedures. . Pregnancy: If you are pregnant, call and cancel the procedure. . Sickness: If you have a cold, fever, or any active infections, call and cancel the procedure. . Arrival: You must be in the facility at least 30 minutes prior to your scheduled procedure. . Children: Do not bring any children with you. . Dress appropriately: Bring dark clothing that you would not mind if they get stained. . Valuables: Do not bring any jewelry or valuables.  Procedure appointments are reserved for interventional treatments only. Marland Kitchen No Prescription Refills. . No medication changes will be discussed during procedure appointments. . No disability  issues will be discussed.  Reasons to call and reschedule or cancel your procedure: (Following these recommendations will minimize the risk of a serious complication.) . Surgeries: Avoid having procedures within 2 weeks of any surgery. (Avoid for 2 weeks before or after any surgery). . Flu Shots: Avoid having procedures within 2 weeks of a flu shots or . (Avoid for 2 weeks before or after immunizations). . Barium: Avoid having a procedure within 7-10 days after having had a radiological study involving the use of radiological contrast. (Myelograms, Barium swallow or enema study). . Heart attacks: Avoid any elective procedures or surgeries for the initial 6 months after a "Myocardial Infarction" (Heart Attack). . Blood thinners: It is imperative that you stop these medications before procedures. Let us know if you if you take any blood thinner.  . Infection: Avoid procedures during or within two weeks of an infection (including chest colds or gastrointestinal problems). Symptoms associated with infections include: Localized redness, fever, chills, night sweats or profuse sweating, burning sensation when voiding, cough, congestion, stuffiness, runny nose, sore throat, diarrhea, nausea, vomiting, cold or Flu symptoms, recent or current infections. It is specially important if the infection is over the area that we intend to treat. Marland Kitchen Heart and lung problems: Symptoms that may suggest an active cardiopulmonary problem include: cough, chest pain, breathing difficulties or shortness of breath, dizziness, ankle swelling, uncontrolled high or unusually low blood pressure, and/or palpitations. If you are experiencing any of these symptoms, cancel your procedure and contact your primary care physician for an evaluation.  Remember:  Regular Business hours are:  Monday to Thursday 8:00 AM to 4:00 PM  Provider's Schedule: [place]  Laban Emperor, MD:  Procedure days: Tuesday and Thursday 7:30 AM to 4:00 PM  Edward Jolly, MD:  Procedure days: Monday and Wednesday 7:30 AM to 4:00 PM ____________________________________________________________________________________________    Sacroiliac (SI) Joint Injection Patient Information  Description: The sacroiliac joint connects the scrum (very low back and tailbone) to the ilium (a pelvic bone which also forms half of the hip joint).  Normally this joint experiences very little motion.  When this joint becomes inflamed or unstable low back and or hip and pelvis pain may result.  Injection of this joint with local anesthetics (numbing medicines) and steroids can provide diagnostic information and reduce pain.  This injection is performed with the aid of x-Robideau guidance into the tailbone area while you are lying on your stomach.   You may experience an electrical sensation down the leg while this is being done.  You may also experience numbness.  We also may ask if we are reproducing your normal pain during the injection.  Conditions which may be treated SI injection:   Low back, buttock, hip or leg pain  Preparation for the Injection:  1. Do not eat any solid food or dairy products within 8 hours of your appointment.  2. You may drink clear liquids up to 3 hours before appointment.  Clear liquids include water, black coffee, juice or soda.  No milk or cream please. 3. You may take your regular medications, including pain medications with a sip of water before your appointment.  Diabetics should hold regular insulin (if take separately) and take 1/2 normal NPH dose the morning of the procedure.  Carry some sugar containing items with you to your appointment. 4. A driver must accompany you and be prepared to drive you home after your procedure. 5. Bring all of your current medications with you. 6. An IV may be inserted and sedation may be given at the discretion of the physician. 7. A blood pressure cuff, EKG and other monitors will often be applied during the  procedure.  Some patients may need to have extra oxygen administered for a short period.  8. You will be asked to provide medical information, including your allergies, prior to the procedure.  We must know immediately if you are taking blood thinners (like Coumadin/Warfarin) or if you are allergic to IV iodine contrast (dye).  We must know if you could possible be pregnant.  Possible side effects:   Bleeding from needle site  Infection (rare, may require surgery)  Nerve injury (rare)  Numbness & tingling (temporary)  A brief convulsion or seizure  Light-headedness (temporary)  Pain at injection site (several days)  Decreased blood pressure (temporary)  Weakness in the leg (temporary)   Call if you experience:   New onset weakness or numbness of an extremity below the injection site that last more than 8 hours.  Hives or difficulty breathing ( go to the emergency room)  Inflammation or drainage at the injection site  Any new symptoms which are concerning to you  Please note:  Although the local anesthetic injected can often make your back/ hip/ buttock/ leg feel good for several hours after the injections, the pain will likely return.  It takes 3-7 days for steroids to work in the sacroiliac area.  You may not notice any pain relief for at least that one week.  If effective, we will often do a series of three injections spaced 3-6 weeks apart to maximally decrease your pain.  After the initial series, we  generally will wait some months before a repeat injection of the same type.  If you have any questions, please call [phone] [facility]

## 2018-03-12 NOTE — Progress Notes (Signed)
Safety precautions to be maintained throughout the outpatient stay will include: orient to surroundings, keep bed in low position, maintain call bell within reach at all times, provide assistance with transfer out of bed and ambulation.  

## 2018-03-21 ENCOUNTER — Ambulatory Visit: Payer: Medicare Other | Admitting: Student in an Organized Health Care Education/Training Program

## 2018-03-28 ENCOUNTER — Ambulatory Visit (INDEPENDENT_AMBULATORY_CARE_PROVIDER_SITE_OTHER): Payer: Medicare Other | Admitting: Family Medicine

## 2018-03-28 ENCOUNTER — Encounter: Payer: Self-pay | Admitting: Family Medicine

## 2018-03-28 VITALS — BP 165/80 | HR 64 | Temp 98.3°F | Wt 140.4 lb

## 2018-03-28 DIAGNOSIS — I10 Essential (primary) hypertension: Secondary | ICD-10-CM | POA: Diagnosis not present

## 2018-03-28 DIAGNOSIS — G894 Chronic pain syndrome: Secondary | ICD-10-CM | POA: Diagnosis not present

## 2018-03-28 DIAGNOSIS — E7800 Pure hypercholesterolemia, unspecified: Secondary | ICD-10-CM | POA: Diagnosis not present

## 2018-03-28 NOTE — Progress Notes (Signed)
Patient: Gina [name] Female    DOB: 1940-09-25   78 y.o.   MRN: [id number] Visit Date: 03/28/2018  Today's Provider: Shirlee Latch, MD   Chief Complaint  Patient presents with  . Hypertension   Subjective:    I, Presley Raddle, CMA, am acting as a Neurosurgeon for Shirlee Latch, MD.   HPI  Hypertension, follow-up:     BP Readings from Last 3 Encounters:  02/12/18 (!) 170/78  01/10/18 (!) 159/80  12/12/17 (!) 149/78    She was last seen for hypertension 6 weeks ago.  BP at that visit was 170/78 Management changes since that visit include increasing Losartan to 100 mg and continue Metoprolol and Amlodipine at current dose. She reports good compliance with treatment. She is not having side effects.  She is not exercising. She is adherent to low salt diet.   Outside blood pressures are being checked. States they are in the 130s systolic.  Has not had her medication this AM. She is experiencing none.  Patient denies chest pain, chest pressure/discomfort, fatigue, irregular heart beat, lower extremity edema, palpitations and tachypnea.   Cardiovascular risk factors include hypertension.  Use of agents associated with hypertension: none.                Weight trend: stable    Wt Readings from Last 3 Encounters:  02/12/18 142 lb 9.6 oz (64.7 kg)  01/10/18 140 lb (63.5 kg)  12/12/17 140 lb (63.5 kg)    Current diet: well balanced  ------------------------------------------------------------------------   Allergies  Allergen Reactions  . Hydrochlorothiazide   . Chlorthalidone Nausea Only  . Lisinopril Cough  . Penicillins Rash     Current Outpatient Medications:  .  acetaminophen (TYLENOL) 325 MG tablet, Take by mouth every 6 (six) hours as needed for pain., Disp: , Rfl:  .  amLODipine (NORVASC) 5 MG tablet, TAKE 1 TABLET BY MOUTH ONCE DAILY, Disp: 90 tablet, Rfl: 3 .  aspirin 325 MG tablet, Take 325 mg by mouth daily., Disp: , Rfl:  .  atorvastatin  (LIPITOR) 40 MG tablet, Take 1 tablet (40 mg total) by mouth daily., Disp: 90 tablet, Rfl: 3 .  cetirizine (ZYRTEC) 10 MG tablet, Take 10 mg by mouth daily. Reported on 08/10/2015, Disp: , Rfl:  .  cholecalciferol (VITAMIN D) 1000 units tablet, Take 1,000 Units by mouth daily., Disp: , Rfl:  .  losartan (COZAAR) 100 MG tablet, Take 1 tablet (100 mg total) by mouth daily., Disp: 90 tablet, Rfl: 3 .  metoprolol (LOPRESSOR) 100 MG tablet, Take 100 mg by mouth 2 (two) times daily., Disp: , Rfl:  .  mirabegron ER (MYRBETRIQ) 25 MG TB24 tablet, Take 1 tablet (25 mg total) by mouth daily., Disp: 30 tablet, Rfl: 3 .  ranitidine (ZANTAC) 150 MG tablet, Take 150 mg by mouth daily. , Disp: , Rfl:   Review of Systems  Constitutional: Negative.   Respiratory: Negative.   Cardiovascular: Negative.   Musculoskeletal: Negative.     Social History   Tobacco Use  . Smoking status: Former Smoker    Packs/day: 0.50    Years: 5.00    Pack years: 2.50    Last attempt to quit: 01/23/1970    Years since quitting: 48.2  . Smokeless tobacco: Never Used  Substance Use Topics  . Alcohol use: Yes    Comment: rare - 1 drink      Objective:   BP (!) 165/80 (BP Location: Right  Arm, Patient Position: Sitting, Cuff Size: Normal)   Pulse 64   Temp 98.3 F (36.8 C) (Oral)   Wt 140 lb 6.4 oz (63.7 kg)   SpO2 98%   BMI 24.10 kg/m  Vitals:   03/28/18 1533  BP: (!) 165/80  Pulse: 64  Temp: 98.3 F (36.8 C)  TempSrc: Oral  SpO2: 98%  Weight: 140 lb 6.4 oz (63.7 kg)     Physical Exam Vitals signs reviewed.  Constitutional:      General: She is not in acute distress.    Appearance: She is well-developed.  HENT:     Head: Normocephalic and atraumatic.  Eyes:     General: No scleral icterus.    Conjunctiva/sclera: Conjunctivae normal.  Neck:     Musculoskeletal: Neck supple.  Cardiovascular:     Rate and Rhythm: Normal rate and regular rhythm.     Heart sounds: Normal heart sounds. No murmur.    Pulmonary:     Effort: Pulmonary effort is normal. No respiratory distress.     Breath sounds: No wheezing or rhonchi.  Musculoskeletal:     Right lower leg: No edema.     Left lower leg: No edema.  Lymphadenopathy:     Cervical: No cervical adenopathy.  Skin:    General: Skin is warm and dry.     Capillary Refill: Capillary refill takes less than 2 seconds.     Findings: No rash.  Neurological:     Mental Status: She is alert and oriented to person, place, and time. Mental status is at baseline.  Psychiatric:        Mood and Affect: Mood normal.        Behavior: Behavior normal.         Assessment & Plan   Problem List Items Addressed This Visit      Cardiovascular and Mediastinum   Hypertension, essential - Primary    Uncontrolled I remain concerned about her compliance with medications She states she has not taken them yet this morning but they are well controlled at home Would like to add HCTZ, but patient declines this today We will continue her amlodipine, losartan, metoprolol at current doses Higher doses of amlodipine caused leg swelling in the past We will recheck BMP at next visit        Other   Chronic pain syndrome (Chronic)    Secondary to spinal stenosis and DJD She has been trying ESI with pain management and states is not helpful She also states gabapentin was not helpful She is tried Cymbalta for about 3 weeks and decided did not work and stopped it Given CKD 3, avoid chronic NSAIDs Advised her to discuss other options for pain management with the pain management clinic      Hypercholesteremia    Patient again initially states that she is taking her statin, but later reveals that she is not We again discussed the importance especially given her history of CVA She will resume this Would be a great candidate for CCM-we will discuss at next visit          Return in about 2 months (around 05/28/2018) for CPE with AWV (after 06/03/18).   The  entirety of the information documented in the History of Present Illness, Review of Systems and Physical Exam were personally obtained by me. Portions of this information were initially documented by Presley Raddle, CMA and reviewed by me for thoroughness and accuracy.    Erasmo Downer, MD, MPH  [facility] 03/29/2018 2:17 PM

## 2018-03-28 NOTE — Patient Instructions (Signed)

## 2018-03-29 NOTE — Assessment & Plan Note (Signed)
Patient again initially states that she is taking her statin, but later reveals that she is not We again discussed the importance especially given her history of CVA She will resume this Would be a great candidate for CCM-we will discuss at next visit

## 2018-03-29 NOTE — Assessment & Plan Note (Signed)
Secondary to spinal stenosis and DJD She has been trying ESI with pain management and states is not helpful She also states gabapentin was not helpful She is tried Cymbalta for about 3 weeks and decided did not work and stopped it Given CKD 3, avoid chronic NSAIDs Advised her to discuss other options for pain management with the pain management clinic

## 2018-03-29 NOTE — Assessment & Plan Note (Signed)
Uncontrolled I remain concerned about her compliance with medications She states she has not taken them yet this morning but they are well controlled at home Would like to add HCTZ, but patient declines this today We will continue her amlodipine, losartan, metoprolol at current doses Higher doses of amlodipine caused leg swelling in the past We will recheck BMP at next visit

## 2018-06-04 ENCOUNTER — Ambulatory Visit: Payer: Self-pay

## 2018-06-14 ENCOUNTER — Telehealth: Payer: Self-pay

## 2018-06-14 NOTE — Telephone Encounter (Signed)
Pt called and advised that she will do the phone visit on 06/19/18 at 3 pm.  Call back # [phone]

## 2018-06-19 ENCOUNTER — Ambulatory Visit: Payer: Self-pay

## 2018-06-22 ENCOUNTER — Ambulatory Visit: Payer: Self-pay

## 2018-06-22 ENCOUNTER — Encounter: Payer: Self-pay | Admitting: Family Medicine

## 2018-06-22 ENCOUNTER — Ambulatory Visit (INDEPENDENT_AMBULATORY_CARE_PROVIDER_SITE_OTHER): Payer: Medicare Other | Admitting: Family Medicine

## 2018-06-22 VITALS — BP 132/66

## 2018-06-22 DIAGNOSIS — Z8673 Personal history of transient ischemic attack (TIA), and cerebral infarction without residual deficits: Secondary | ICD-10-CM

## 2018-06-22 DIAGNOSIS — G5790 Unspecified mononeuropathy of unspecified lower limb: Secondary | ICD-10-CM

## 2018-06-22 DIAGNOSIS — E7800 Pure hypercholesterolemia, unspecified: Secondary | ICD-10-CM | POA: Diagnosis not present

## 2018-06-22 DIAGNOSIS — I10 Essential (primary) hypertension: Secondary | ICD-10-CM | POA: Diagnosis not present

## 2018-06-22 DIAGNOSIS — M47816 Spondylosis without myelopathy or radiculopathy, lumbar region: Secondary | ICD-10-CM | POA: Diagnosis not present

## 2018-06-22 NOTE — Assessment & Plan Note (Signed)
Advised her to call back to pain management for f/u appt She can discuss interventional options as well

## 2018-06-22 NOTE — Progress Notes (Signed)
Patient: Gina ApleyMary E [name] Female    DOB: 07/07/1940   78 y.o.   MRN: [id number] Visit Date: 06/22/2018  Today's Provider: Shirlee LatchAngela Broderick Fonseca, MD   Chief Complaint  Patient presents with  . Hypertension   Subjective:    Virtual Visit via Telephone Note  I connected with Gina ApleyMary E [name] on 06/22/18 at  9:00 AM EDT by telephone and verified that I am speaking with the correct person using two identifiers.   Patient location: home Provider location: home office  Persons involved in the visit: patient, provider     I discussed the limitations, risks, security and privacy concerns of performing an evaluation and management service by telephone and the availability of in person appointments. I also discussed with the patient that there may be a patient responsible charge related to this service. The patient expressed understanding and agreed to proceed.  HPI    Hypertension, follow-up:  BP Readings from Last 3 Encounters:  06/22/18 132/66  03/28/18 (!) 165/80  03/12/18 (!) 164/71    Shewas last seen for hypertension 3 months ago.  BP at that visit was165/80 Management changes since that visit includecontinue Losartan, Metoprolol, and Amlodipine at current dose. Shereports goodcompliance with treatment. Sheis nothaving side effects.  Sheis notexercising. Sheisadherent to low salt diet.  Outside blood pressures are being checked. States they are in the 140s systolic.  Sheis experiencing none.  Patient denieschest pain, chest pressure/discomfort, fatigue, irregular heart beat, lower extremity edema, palpitations and tachypnea.  Cardiovascular risk factors includehypertension, advanced age. Use of agents associated with hypertension:none.   Weight trend:stable    Wt Readings from Last 3 Encounters:  02/12/18 142 lb 9.6 oz (64.7 kg)  01/10/18 140 lb (63.5 kg)  12/12/17 140 lb (63.5 kg)   Current diet:well balanced at times   Allergies  Allergen  Reactions  . Hydrochlorothiazide   . Chlorthalidone Nausea Only  . Lisinopril Cough  . Penicillins Rash     Current Outpatient Medications:  .  acetaminophen (TYLENOL) 325 MG tablet, Take by mouth every 6 (six) hours as needed for pain., Disp: , Rfl:  .  amLODipine (NORVASC) 5 MG tablet, TAKE 1 TABLET BY MOUTH ONCE DAILY, Disp: 90 tablet, Rfl: 3 .  aspirin 325 MG tablet, Take 325 mg by mouth daily., Disp: , Rfl:  .  atorvastatin (LIPITOR) 40 MG tablet, Take 1 tablet (40 mg total) by mouth daily., Disp: 90 tablet, Rfl: 3 .  cetirizine (ZYRTEC) 10 MG tablet, Take 10 mg by mouth daily. Reported on 08/10/2015, Disp: , Rfl:  .  cholecalciferol (VITAMIN D) 1000 units tablet, Take 1,000 Units by mouth daily., Disp: , Rfl:  .  losartan (COZAAR) 100 MG tablet, Take 1 tablet (100 mg total) by mouth daily., Disp: 90 tablet, Rfl: 3 .  metoprolol (LOPRESSOR) 100 MG tablet, Take 100 mg by mouth 2 (two) times daily., Disp: , Rfl:  .  mirabegron ER (MYRBETRIQ) 25 MG TB24 tablet, Take 1 tablet (25 mg total) by mouth daily., Disp: 30 tablet, Rfl: 3 .  ranitidine (ZANTAC) 150 MG tablet, Take 150 mg by mouth daily. , Disp: , Rfl:   Review of Systems  Constitutional: Negative.   Respiratory: Negative.   Cardiovascular: Negative.   Musculoskeletal: Negative.     Social History   Tobacco Use  . Smoking status: Former Smoker    Packs/day: 0.50    Years: 5.00    Pack years: 2.50    Last attempt to  quit: 01/23/1970    Years since quitting: 48.4  . Smokeless tobacco: Never Used  Substance Use Topics  . Alcohol use: Yes    Comment: rare - 1 drink      Objective:   BP 132/66  Vitals:   06/22/18 0858  BP: 132/66     Physical Exam      Assessment & Plan     I discussed the assessment and treatment plan with the patient. The patient was provided an opportunity to ask questions and all were answered. The patient agreed with the plan and demonstrated an understanding of the instructions.    The patient was advised to call back or seek an in-person evaluation if the symptoms worsen or if the condition fails to improve as anticipated.  Problem List Items Addressed This Visit      Cardiovascular and Mediastinum   Hypertension, essential - Primary    Well controlled on home readings Higher goal of <150/90 due to advanced age and fall risk Continue current meds Discussed that in office readings may be related to white coat hypertension Will recheck BMP at next in person visit      Relevant Orders   Referral to Chronic Care Management Services     Nervous and Auditory   Mononeuropathy of lower extremity   Relevant Orders   Referral to Chronic Care Management Services     Musculoskeletal and Integument   Lumbar spondylosis    Advised her to call back to pain management for f/u appt She can discuss interventional options as well      Relevant Orders   Referral to Chronic Care Management Services     Other   History of CVA (cerebrovascular accident)    Discussed importance of secondary prevention Have had concerns about medicaiton compliance in the past Will refer to CCM to help with medicaiton compliance and management of chronic disease      Relevant Orders   Referral to Chronic Care Management Services   Hypercholesteremia    Have had concerns about medication compliance in the past, but she insists she is taking statin Will enlist help from CCM and she agrees to referral      Relevant Orders   Referral to Chronic Care Management Services       Return in about 3 months (around 09/22/2018) for chronic disease f/u.   The entirety of the information documented in the History of Present Illness, Review of Systems and Physical Exam were personally obtained by me. Portions of this information were initially documented by Presley Raddle, CMA and reviewed by me for thoroughness and accuracy.    Sharonne Ricketts, Marzella Schlein, MD MPH [facility]

## 2018-06-22 NOTE — Assessment & Plan Note (Signed)
Have had concerns about medication compliance in the past, but she insists she is taking statin Will enlist help from CCM and she agrees to referral

## 2018-06-22 NOTE — Assessment & Plan Note (Signed)
Discussed importance of secondary prevention Have had concerns about medicaiton compliance in the past Will refer to CCM to help with medicaiton compliance and management of chronic disease

## 2018-06-22 NOTE — Assessment & Plan Note (Signed)
Well controlled on home readings Higher goal of <150/90 due to advanced age and fall risk Continue current meds Discussed that in office readings may be related to white coat hypertension Will recheck BMP at next in person visit

## 2018-06-25 ENCOUNTER — Encounter: Payer: Self-pay | Admitting: *Deleted

## 2018-06-25 ENCOUNTER — Ambulatory Visit: Payer: Self-pay | Admitting: *Deleted

## 2018-06-25 DIAGNOSIS — I10 Essential (primary) hypertension: Secondary | ICD-10-CM

## 2018-06-25 DIAGNOSIS — E7800 Pure hypercholesterolemia, unspecified: Secondary | ICD-10-CM

## 2018-06-25 NOTE — Chronic Care Management (AMB) (Signed)
  Chronic Care Management    Clinical Social Work General Note  06/25/2018 Name: Gina [name] MRN: [id number] DOB: 1940-01-31  Gina [name] is a 78 y.o. year old female who is a primary care patient of Bacigalupo, Dionne Bucy, MD. The CCM was consulted to assist the patient with medication adherence and management of chronic disease.  [name] was given information about Chronic Care Management services today including:  1. CCM service includes personalized support from designated clinical staff supervised by her physician, including individualized plan of care and coordination with other care providers 2. 24/7 contact phone numbers for assistance for urgent and routine care needs. 3. Service will only be billed when office clinical staff spend 20 minutes or more in a month to coordinate care. 4. Only one practitioner may furnish and bill the service in a calendar month. 5. The patient may stop CCM services at any time (effective at the end of the month) by phone call to the office staff. 6. The patient will be responsible for cost sharing (co-pay) of up to 20% of the service fee (after annual deductible is met).  Patient agreed to services and verbal consent obtained.   Review of patient status, including review of consultants reports, relevant laboratory and other test results, and collaboration with appropriate care team members and the patient's provider was performed as part of comprehensive patient evaluation and provision of chronic care management services.    SDOH (Social Determinants of Health) screening performed today. See Care Plan Entry related to challenges with: Physical Activity  Goals Addressed   None      Follow Up Plan: Client will follow up with RNCM for intial intake appointment on 07/03/2018        Elliot Gurney, [place] Clinical Social Worker  [facility] [phone]

## 2018-06-26 ENCOUNTER — Other Ambulatory Visit: Payer: Self-pay

## 2018-06-26 ENCOUNTER — Ambulatory Visit (INDEPENDENT_AMBULATORY_CARE_PROVIDER_SITE_OTHER): Payer: Medicare Other

## 2018-06-26 DIAGNOSIS — Z13820 Encounter for screening for osteoporosis: Secondary | ICD-10-CM | POA: Diagnosis not present

## 2018-06-26 DIAGNOSIS — Z0000 Encounter for general adult medical examination without abnormal findings: Secondary | ICD-10-CM

## 2018-06-26 NOTE — Patient Instructions (Addendum)
Gina [name] , Thank you for taking time to come for your Medicare Wellness Visit. I appreciate your ongoing commitment to your health goals. Please review the following plan we discussed and let me know if I can assist you in the future.   Screening recommendations/referrals: Colonoscopy: No longer required.  Mammogram: No longer required.  Bone Density: Ordered today. Pt provided with contact info and advised to call to schedule appt. Pt aware the office will call re: appt. Recommended yearly ophthalmology/optometry visit for glaucoma screening and checkup Recommended yearly dental visit for hygiene and checkup  Vaccinations: Influenza vaccine: Pt declines today.  Pneumococcal vaccine: Pt declines today.  Tdap vaccine: Pt declines today.  Shingles vaccine: Pt declines today.     Advanced directives: Advance directive discussed with you today. Even though you declined this today please call our office should you change your mind and we can give you the proper paperwork for you to fill out.  Conditions/risks identified: Continue to trying to increase water intake to 6-8 8 oz glasses a day.   Next appointment: 09/21/18 with Dr Beryle Flock.   Preventive Care 65 Years and Older, Female Preventive care refers to lifestyle choices and visits with your health care provider that can promote health and wellness. What does preventive care include?  A yearly physical exam. This is also called an annual well check.  Dental exams once or twice a year.  Routine eye exams. Ask your health care provider how often you should have your eyes checked.  Personal lifestyle choices, including:  Daily care of your teeth and gums.  Regular physical activity.  Eating a healthy diet.  Avoiding tobacco and drug use.  Limiting alcohol use.  Practicing safe sex.  Taking low-dose aspirin every day.  Taking vitamin and mineral supplements as recommended by your health care provider. What happens during  an annual well check? The services and screenings done by your health care provider during your annual well check will depend on your age, overall health, lifestyle risk factors, and family history of disease. Counseling  Your health care provider may ask you questions about your:  Alcohol use.  Tobacco use.  Drug use.  Emotional well-being.  Home and relationship well-being.  Sexual activity.  Eating habits.  History of falls.  Memory and ability to understand (cognition).  Work and work Astronomer.  Reproductive health. Screening  You may have the following tests or measurements:  Height, weight, and BMI.  Blood pressure.  Lipid and cholesterol levels. These may be checked every 5 years, or more frequently if you are over 15 years old.  Skin check.  Lung cancer screening. You may have this screening every year starting at age 31 if you have a 30-pack-year history of smoking and currently smoke or have quit within the past 15 years.  Fecal occult blood test (FOBT) of the stool. You may have this test every year starting at age 30.  Flexible sigmoidoscopy or colonoscopy. You may have a sigmoidoscopy every 5 years or a colonoscopy every 10 years starting at age 81.  Hepatitis C blood test.  Hepatitis B blood test.  Sexually transmitted disease (STD) testing.  Diabetes screening. This is done by checking your blood sugar (glucose) after you have not eaten for a while (fasting). You may have this done every 1-3 years.  Bone density scan. This is done to screen for osteoporosis. You may have this done starting at age 28.  Mammogram. This may be done every 1-2 years.  Talk to your health care provider about how often you should have regular mammograms. Talk with your health care provider about your test results, treatment options, and if necessary, the need for more tests. Vaccines  Your health care provider may recommend certain vaccines, such as:  Influenza  vaccine. This is recommended every year.  Tetanus, diphtheria, and acellular pertussis (Tdap, Td) vaccine. You may need a Td booster every 10 years.  Zoster vaccine. You may need this after age 21.  Pneumococcal 13-valent conjugate (PCV13) vaccine. One dose is recommended after age 104.  Pneumococcal polysaccharide (PPSV23) vaccine. One dose is recommended after age 56. Talk to your health care provider about which screenings and vaccines you need and how often you need them. This information is not intended to replace advice given to you by your health care provider. Make sure you discuss any questions you have with your health care provider. Document Released: 02/06/2015 Document Revised: 09/30/2015 Document Reviewed: 11/11/2014 Elsevier Interactive Patient Education  2017 [place] Prevention in the Home Falls can cause injuries. They can happen to people of all ages. There are many things you can do to make your home safe and to help prevent falls. What can I do on the outside of my home?  Regularly fix the edges of walkways and driveways and fix any cracks.  Remove anything that might make you trip as you walk through a door, such as a raised step or threshold.  Trim any bushes or trees on the path to your home.  Use bright outdoor lighting.  Clear any walking paths of anything that might make someone trip, such as rocks or tools.  Regularly check to see if handrails are loose or broken. Make sure that both sides of any steps have handrails.  Any raised decks and porches should have guardrails on the edges.  Have any leaves, snow, or ice cleared regularly.  Use sand or salt on walking paths during winter.  Clean up any spills in your garage right away. This includes oil or grease spills. What can I do in the bathroom?  Use night lights.  Install grab bars by the toilet and in the tub and shower. Do not use towel bars as grab bars.  Use non-skid mats or decals  in the tub or shower.  If you need to sit down in the shower, use a plastic, non-slip stool.  Keep the floor dry. Clean up any water that spills on the floor as soon as it happens.  Remove soap buildup in the tub or shower regularly.  Attach bath mats securely with double-sided non-slip rug tape.  Do not have throw rugs and other things on the floor that can make you trip. What can I do in the bedroom?  Use night lights.  Make sure that you have a light by your bed that is easy to reach.  Do not use any sheets or blankets that are too big for your bed. They should not hang down onto the floor.  Have a firm chair that has side arms. You can use this for support while you get dressed.  Do not have throw rugs and other things on the floor that can make you trip. What can I do in the kitchen?  Clean up any spills right away.  Avoid walking on wet floors.  Keep items that you use a lot in easy-to-reach places.  If you need to reach something above you, use a strong step stool  that has a grab bar.  Keep electrical cords out of the way.  Do not use floor polish or wax that makes floors slippery. If you must use wax, use non-skid floor wax.  Do not have throw rugs and other things on the floor that can make you trip. What can I do with my stairs?  Do not leave any items on the stairs.  Make sure that there are handrails on both sides of the stairs and use them. Fix handrails that are broken or loose. Make sure that handrails are as long as the stairways.  Check any carpeting to make sure that it is firmly attached to the stairs. Fix any carpet that is loose or worn.  Avoid having throw rugs at the top or bottom of the stairs. If you do have throw rugs, attach them to the floor with carpet tape.  Make sure that you have a light switch at the top of the stairs and the bottom of the stairs. If you do not have them, ask someone to add them for you. What else can I do to help  prevent falls?  Wear shoes that:  Do not have high heels.  Have rubber bottoms.  Are comfortable and fit you well.  Are closed at the toe. Do not wear sandals.  If you use a stepladder:  Make sure that it is fully opened. Do not climb a closed stepladder.  Make sure that both sides of the stepladder are locked into place.  Ask someone to hold it for you, if possible.  Clearly mark and make sure that you can see:  Any grab bars or handrails.  First and last steps.  Where the edge of each step is.  Use tools that help you move around (mobility aids) if they are needed. These include:  Canes.  Walkers.  Scooters.  Crutches.  Turn on the lights when you go into a dark area. Replace any light bulbs as soon as they burn out.  Set up your furniture so you have a clear path. Avoid moving your furniture around.  If any of your floors are uneven, fix them.  If there are any pets around you, be aware of where they are.  Review your medicines with your doctor. Some medicines can make you feel dizzy. This can increase your chance of falling. Ask your doctor what other things that you can do to help prevent falls. This information is not intended to replace advice given to you by your health care provider. Make sure you discuss any questions you have with your health care provider. Document Released: 11/06/2008 Document Revised: 06/18/2015 Document Reviewed: 02/14/2014 Elsevier Interactive Patient Education  2017 [employer].

## 2018-06-26 NOTE — Progress Notes (Signed)
Subjective:   Gina [name] is a 78 y.o. female who presents for Medicare Annual (Subsequent) preventive examination.    This visit is being conducted through telemedicine due to the COVID-19 pandemic. This patient has given me verbal consent via doximity to conduct this visit, patient states they are participating from their home address. Some vital signs may be absent or patient reported.    Patient identification: identified by name, DOB, and current address  Review of Systems:  N/A  Cardiac Risk Factors include: advanced age (>68men, >58 women);hypertension     Objective:     Vitals: There were no vitals taken for this visit.  There is no height or weight on file to calculate BMI. Unable to obtain vitals due to visit being conducted via telephonically.   Advanced Directives 06/26/2018 12/12/2017 06/02/2017 09/01/2014  Does Patient Have a Medical Advance Directive? No No No No  Would patient like information on creating a medical advance directive? No - Patient declined No - Patient declined Yes (MAU/Ambulatory/Procedural Areas - Information given) -    Tobacco Social History   Tobacco Use  Smoking Status Former Smoker  . Packs/day: 0.50  . Years: 5.00  . Pack years: 2.50  . Last attempt to quit: 01/23/1970  . Years since quitting: 48.4  Smokeless Tobacco Never Used     Counseling given: Not Answered   Clinical Intake:  Pre-visit preparation completed: Yes  Pain : No/denies pain Pain Score: 0-No pain     Nutritional Status: BMI of 19-24  Normal Nutritional Risks: None Diabetes: No  How often do you need to have someone help you when you read instructions, pamphlets, or other written materials from your doctor or pharmacy?: 1 - Never  Interpreter Needed?: No  Information entered by :: [facility], LPN  Past Medical History:  Diagnosis Date  . Abnormal blood sugar 08/03/2007  . Hyperlipidemia   . Hypertension 2010  . Mini stroke (HCC) 2010, 2014   Past  Surgical History:  Procedure Laterality Date  . BREAST SURGERY Left 1982?   biopsy  . CESAREAN SECTION    . HERNIA REPAIR Left 2014   Laparoscopic placement  10 x 15 cm Physiomesh at lateral abdominal wall port site hernia  . HIP SURGERY Left 2013  . OOPHORECTOMY  2013   Family History  Problem Relation Age of Onset  . Congestive Heart Failure Mother 78       Glucose intolerance  . Heart disease Father 57       Valvular  . Rheumatic fever Father   . Heart failure Brother 77       heart valve  . Rheumatic fever Sister    Social History   Socioeconomic History  . Marital status: Widowed    Spouse name: Not on file  . Number of children: 1  . Years of education: Not on file  . Highest education level: 12th grade  Occupational History  . Occupation: retired  Engineer, production  . Financial resource strain: Not hard at all  . Food insecurity:    Worry: Never true    Inability: Never true  . Transportation needs:    Medical: No    Non-medical: No  Tobacco Use  . Smoking status: Former Smoker    Packs/day: 0.50    Years: 5.00    Pack years: 2.50    Last attempt to quit: 01/23/1970    Years since quitting: 48.4  . Smokeless tobacco: Never Used  Substance and Sexual  Activity  . Alcohol use: Yes    Comment: rare - 1 drink  . Drug use: No  . Sexual activity: Not Currently  Lifestyle  . Physical activity:    Days per week: 0 days    Minutes per session: 0 min  . Stress: Not at all  Relationships  . Social connections:    Talks on phone: More than three times a week    Gets together: More than three times a week    Attends religious service: More than 4 times per year    Active member of club or organization: Yes    Attends meetings of clubs or organizations: 1 to 4 times per year    Relationship status: Widowed  Other Topics Concern  . Not on file  Social History Narrative  . Not on file    Outpatient Encounter Medications as of 06/26/2018  Medication Sig  .  acetaminophen (TYLENOL) 325 MG tablet Take by mouth every 6 (six) hours as needed for pain.  Marland Kitchen amLODipine (NORVASC) 5 MG tablet TAKE 1 TABLET BY MOUTH ONCE DAILY  . aspirin 325 MG tablet Take 325 mg by mouth daily.  Marland Kitchen atorvastatin (LIPITOR) 40 MG tablet Take 1 tablet (40 mg total) by mouth daily.  . cetirizine (ZYRTEC) 10 MG tablet Take 10 mg by mouth daily. Reported on 08/10/2015  . cholecalciferol (VITAMIN D) 1000 units tablet Take 1,000 Units by mouth daily.  Marland Kitchen losartan (COZAAR) 100 MG tablet Take 1 tablet (100 mg total) by mouth daily.  . metoprolol (LOPRESSOR) 100 MG tablet Take 100 mg by mouth 2 (two) times daily.  . mirabegron ER (MYRBETRIQ) 25 MG TB24 tablet Take 1 tablet (25 mg total) by mouth daily.  . ranitidine (ZANTAC) 150 MG tablet Take 150 mg by mouth daily.    No facility-administered encounter medications on file as of 06/26/2018.     Activities of Daily Living In your present state of health, do you have any difficulty performing the following activities: 06/26/2018  Hearing? Y  Comment Does not wear hearing aids.   Vision? Y  Comment Needs a new eye glass prescription. Pt plans to set up an eye exam this year.   Difficulty concentrating or making decisions? N  Walking or climbing stairs? Y  Comment Due to leg pains.   Dressing or bathing? N  Doing errands, shopping? N  Preparing Food and eating ? N  Using the Toilet? N  In the past six months, have you accidently leaked urine? Y  Comment Wears protection at night.   Do you have problems with loss of bowel control? N  Managing your Medications? N  Managing your Finances? N  Housekeeping or managing your Housekeeping? N  Some recent data might be hidden    Patient Care Team: Erasmo Downer, MD as PCP - General (Family Medicine) Lady Gary Darlin Priestly, MD as Consulting Physician (Cardiology) Oscar La, RN as Registered Nurse (Acute Care)    Assessment:   This is a routine wellness examination for [facility].   Exercise Activities and Dietary recommendations Current Exercise Habits: The patient does not participate in regular exercise at present, Exercise limited by: orthopedic condition(s)  Goals    . DIET - INCREASE WATER INTAKE     Recommend increasing water intake to 3 glasses a day.        Fall Risk: Fall Risk  06/22/2018 03/12/2018 01/10/2018 12/12/2017 06/02/2017  Falls in the past year? 0 0 0 1 No  Number  falls in past yr: - - - 1 -  Injury with Fall? - - - 1 -  Comment - - - hurt right knee -  Risk for fall due to : - - - History of fall(s) -  Follow up - - - Falls prevention discussed -    FALL RISK PREVENTION PERTAINING TO THE HOME:  Any stairs in or around the home? Yes  If so, are there any without handrails? Yes   Home free of loose throw rugs in walkways, pet beds, electrical cords, etc? Yes  Adequate lighting in your home to reduce risk of falls? Yes   ASSISTIVE DEVICES UTILIZED TO PREVENT FALLS:  Life alert? No  Use of a cane, walker or w/c? Yes  Grab bars in the bathroom? No  Shower chair or bench in shower? Yes  Elevated toilet seat or a handicapped toilet? No    TIMED UP AND GO:  Was the test performed? No .    Depression Screen PHQ 2/9 Scores 03/12/2018 01/10/2018 12/12/2017 06/02/2017  PHQ - 2 Score 0 0 0 0  PHQ- 9 Score - - - 1     Cognitive Function: Declined today.          There is no immunization history on file for this patient.  Qualifies for Shingles Vaccine? Yes . Due for Shingrix. Education has been provided regarding the importance of this vaccine. Pt has been advised to call insurance company to determine out of pocket expense. Advised may also receive vaccine at local pharmacy or Health Dept. Verbalized acceptance and understanding.  Tdap: Pt declines today.   Flu Vaccine: Pt declines today.   Pneumococcal Vaccine: Pt declines today.   Screening Tests Health Maintenance  Topic Date Due  . DEXA SCAN  01/26/2005  . PNA vac Low  Risk Adult (1 of 2 - PCV13) 01/26/2005  . TETANUS/TDAP  10/31/2022 (Originally 01/27/1959)  . INFLUENZA VACCINE  08/25/2018    Cancer Screenings:  Colorectal Screening: No longer required.   Mammogram: No longer required.   Bone Density: Ordered today. Pt provided with contact info and advised to call to schedule appt. Pt aware the office will call re: appt.  Lung Cancer Screening: (Low Dose CT Chest recommended if Age 4-80 years, 30 pack-year currently smoking OR have quit w/in 15years.) does not qualify.   Additional Screening:  Vision Screening: Recommended annual ophthalmology exams for early detection of glaucoma and other disorders of the eye.  Dental Screening: Recommended annual dental exams for proper oral hygiene  Community Resource Referral:  CRR required this visit?  No       Plan:  I have personally reviewed and addressed the Medicare Annual Wellness questionnaire and have noted the following in the patient's chart:  A. Medical and social history B. Use of alcohol, tobacco or illicit drugs  C. Current medications and supplements D. Functional ability and status E.  Nutritional status F.  Physical activity G. Advance directives H. List of other physicians I.  Hospitalizations, surgeries, and ER visits in previous 12 months J.  Vitals K. Screenings such as hearing and vision if needed, cognitive and depression L. Referrals and appointments   In addition, I have reviewed and discussed with patient certain preventive protocols, quality metrics, and best practice recommendations. A written personalized care plan for preventive services as well as general preventive health recommendations were provided to patient. Nurse Health Advisor  Signed,    Blayne Frankie BlackburnMarkoski, CaliforniaLPN  6/0/45406/02/2018 Nurse Health Advisor  Nurse Notes: Pt declined future orders for a tetanus or pneumonia vaccine.

## 2018-07-03 ENCOUNTER — Ambulatory Visit (INDEPENDENT_AMBULATORY_CARE_PROVIDER_SITE_OTHER): Payer: Medicare Other

## 2018-07-03 ENCOUNTER — Other Ambulatory Visit: Payer: Self-pay

## 2018-07-03 DIAGNOSIS — E7800 Pure hypercholesterolemia, unspecified: Secondary | ICD-10-CM

## 2018-07-03 DIAGNOSIS — I10 Essential (primary) hypertension: Secondary | ICD-10-CM | POA: Diagnosis not present

## 2018-07-03 NOTE — Chronic Care Management (AMB) (Signed)
Chronic Care Management   Initial Visit Note  07/04/2018 Name: Gina Gina [name] MRN: [id number] DOB: 1940/04/11  Subjective: "I have never had to watch my diet and this is frustrating me"  Objective:  BP Readings from Last 3 Encounters:  06/22/18 132/66  03/28/18 (!) 165/80  03/12/18 (!) 164/71    Lab Results  Component Value Date   CHOL 194 11/08/2017   CHOL 199 11/04/2016   CHOL 194 10/30/2015   Lab Results  Component Value Date   HDL 39 (L) 11/08/2017   HDL 37 (L) 11/04/2016   HDL 40 10/30/2015   Lab Results  Component Value Date   LDLCALC 109 (H) 11/08/2017   LDLCALC 116 (H) 11/04/2016   LDLCALC 108 (H) 10/30/2015   Lab Results  Component Value Date   TRIG 231 (H) 11/08/2017   TRIG 324 (H) 11/04/2016   TRIG 231 (H) 10/30/2015   Lab Results  Component Value Date   CHOLHDL 5.0 (H) 11/08/2017   CHOLHDL 5.4 (H) 11/04/2016   CHOLHDL 4.9 (H) 10/30/2015   Assessment: Gina Gina [name] is a 78 y.o. year old female who is a primary care patient of Bacigalupo, Dionne Bucy, MD. Dr. Brita Romp referred Gina Gina [name] to the [facility] Team for chronic case management secondary to dyslipidemia and hypertension management. Patient has a history of but not limited to HTN, Arthritis, CKD3, chronic pain syndrome, Hypercholesteremia, and CVA. Today RN CM completed telephone health assessment and assisted patient with health goal settings.  Review of patient status, including review of consultants reports, relevant laboratory and other test results, and collaboration with appropriate care team members and the patient's provider was performed as part of comprehensive patient evaluation and provision of chronic care management services.    0 ED visits/0 Hospitalizations in last 6 months  Advanced Directives 07/03/2018  Does Patient Have a Medical Advance Directive? No  Would patient like information on creating a medical advance directive? Yes (MAU/Ambulatory/Procedural Areas - Information given)     Goals  Addressed            This Visit's Progress   . I really don't know anything about cholesterol or where it comes from (pt-stated)       Gina Gina [name] has never had to "watch what I ate". Gina [name] admits to not understanding where cholesterol comes from or what elevated cholesterol means in terms of how it can affect her body. Gina [name] does not know what to eat or how to manage except to take her statin. Gina [name] has currently taken herself off her statin because of knee pain. "I read that it can cause leg pain and my knees hurt anyway so I just don't want to take it". After education today, Gina [name] agrees taking her statin is important. Gina [name] will begin taking this evening and will report increasing myalgia to PCP. When Gina [name] was taking, Gina [name] would take in the mornings as Gina [name] did not know it was best to take in the evenings.    Current Barriers:  Marland Kitchen Knowledge Deficits related to understanding hyperlipidemia  Nurse Case Manager Clinical Goal(s):  Marland Kitchen Over the next 14 days, patient will demonstrate improved adherence to prescribed treatment plan for elevated cholesterol  as evidenced byadherence to statin therapy, daily exercise, and low cholesterol diet  Interventions:  . Evaluation of current treatment plan related to dyslipidemia and patient's adherence to plan as established by provider. . Provided education to patient re: dislipidemia and low cholesterol diet . Reviewed medications with patient and  discussed importance of compliance with statin therapy and importance of discussing potential side effects with provider . Discussed plans with patient for ongoing care management follow up and provided patient with direct contact information for care management team . Advised patient, providing education and rationale, to monitor blood pressure daily and record . Provided patient with written educational materials related to dyslipidemia diet and self care management  Patient Self Care Activities:  . Self  administers medications as prescribed . Attends all scheduled provider appointments . Calls pharmacy for medication refills . Attends church or other social activities . Performs ADL's independently . Performs IADL's independently . Calls provider office for new concerns or questions  Initial goal documentation       Follow up plan:  Telephone follow up appointment with care management team member scheduled for: 2 weeks   Vanderbilt Ranieri E. Suzie PortelaPayne, RN, BSN Nurse Care Coordinator [facility] [phone]

## 2018-07-04 NOTE — Patient Instructions (Addendum)
Thank you allowing the Chronic Care Management Team to be a part of your care! It was a pleasure speaking with you today!  1. Continue to take your medications as prescribed including your lipitor 2. Remember, your Lipitor (cholesterol medication) works best if you take it in the evenings 3. Limit your processed foods as this will help maintain a healthy blood pressure and reduce the amounts of trans and saturated fats in your diet. 4. It is best to eat at home but if your are out, making smart choices like a grilled chicken sandwich with lettuce, tomato is better than a fast food burger. You want to limit your fried foods to just a "treat" every once and a while. 5. Please read the information about cholesterol provided below. Call me is you have any questions. 6. When convenient, replace the battery in your BP meter and begin to check your BP 3 times a week/record. 7. I am including an Advanced Directives packet (Living Will/Health Care POA). If you choose to complete, take to a notary public and sign. Provide a copy to Dr. Beryle FlockBacigalupo so it can be placed in your medical records. You or your son keep the original.  CCM (Chronic Care Management) Team   Yvone NeuPortia Korrie Hofbauer RN, BSN Nurse Care Coordinator  [phone]  Karalee HeightJulie Hedrick PharmD  Clinical Pharmacist  [phone]   Verna Czechhrystal Land, LCSW Clinical Social Worker [phone]  Goals Addressed            This Visit's Progress   . I really don't know anything about cholesterol or where it comes from (pt-stated)       Current Barriers:  Marland Kitchen. Knowledge Deficits related to understanding hyperlipidemia  Nurse Case Manager Clinical Goal(s):  Marland Kitchen. Over the next 14 days, patient will demonstrate improved adherence to prescribed treatment plan for elevated cholesterol  as evidenced byadherence to statin therapy, daily exercise, and low cholesterol diet  Interventions:  . Evaluation of current treatment plan related to dyslipidemia and  patient's adherence to plan as established by provider. . Provided education to patient re: dislipidemia and low cholesterol diet . Reviewed medications with patient and discussed importance of compliance with statin therapy and importance of discussing potential side effects with provider . Discussed plans with patient for ongoing care management follow up and provided patient with direct contact information for care management team . Advised patient, providing education and rationale, to monitor blood pressure daily and record . Provided patient with written educational materials related to dyslipidemia diet and self care management  Patient Self Care Activities:  . Self administers medications as prescribed . Attends all scheduled provider appointments . Calls pharmacy for medication refills . Attends church or other social activities . Performs ADL's independently . Performs IADL's independently . Calls provider office for new concerns or questions  Initial goal documentation        Print copy of patient instructions provided.   Telephone follow up appointment with care management team member scheduled for: 2 weeks 07/16/2018 at 11:00  SYMPTOMS OF A STROKE   You have any symptoms of stroke. "BE FAST" is an easy way to remember the main warning signs: ? B - Balance. Signs are dizziness, sudden trouble walking, or loss of balance. ? E - Eyes. Signs are trouble seeing or a sudden change in how you see. ? F - Face. Signs are sudden weakness or loss of feeling of the face, or the face or eyelid drooping on one side. ? A -  Arms. Signs are weakness or loss of feeling in an arm. This happens suddenly and usually on one side of the body. ? S - Speech. Signs are sudden trouble speaking, slurred speech, or trouble understanding what people say. ? T - Time. Time to call emergency services. Write down what time symptoms started.  You have other signs of stroke, such as: ? A sudden, very  bad headache with no known cause. ? Feeling sick to your stomach (nausea). ? Throwing up (vomiting). ? Jerky movements you cannot control (seizure).  SYMPTOMS OF A HEART ATTACK  What are the signs or symptoms? Symptoms of this condition include:  Chest pain. It may feel like: ? Crushing or squeezing. ? Tightness, pressure, fullness, or heaviness.  Pain in the arm, neck, jaw, back, or upper body.  Shortness of breath.  Heartburn.  Indigestion.  Nausea.  Cold sweats.  Feeling tired.  Sudden lightheadedness.  Dyslipidemia Dyslipidemia is an imbalance of waxy, fat-like substances (lipids) in the blood. The body needs lipids in small amounts. Dyslipidemia often involves a high level of cholesterol or triglycerides, which are types of lipids. Common forms of dyslipidemia include: High levels of LDL cholesterol. LDL is the type of cholesterol that causes fatty deposits (plaques) to build up in the blood vessels that carry blood away from your heart (arteries). Low levels of HDL cholesterol. HDL cholesterol is the type of cholesterol that protects against heart disease. High levels of HDL remove the LDL buildup from arteries. High levels of triglycerides. Triglycerides are a fatty substance in the blood that is linked to a buildup of plaques in the arteries. What are the causes? Primary dyslipidemia is caused by changes (mutations) in genes that are passed down through families (inherited). These mutations cause several types of dyslipidemia. Secondary dyslipidemia is caused by lifestyle choices and diseases that lead to dyslipidemia, such as: Eating a diet that is high in animal fat. Not getting enough exercise. Having diabetes, kidney disease, liver disease, or thyroid disease. Drinking large amounts of alcohol. Using certain medicines. What increases the risk? You are more likely to develop this condition if you are an older man or if you are a woman who has gone through  menopause. Other risk factors include: Having a family history of dyslipidemia. Taking certain medicines, including birth control pills, steroids, some diuretics, and beta-blockers. Smoking cigarettes. Eating a high-fat diet. Having certain medical conditions such as diabetes, polycystic ovary syndrome (PCOS), kidney disease, liver disease, or hypothyroidism. Not exercising regularly. Being overweight or obese with too much belly fat. What are the signs or symptoms? In most cases, dyslipidemia does not usually cause any symptoms. In severe cases, very high lipid levels can cause: Fatty bumps under the skin (xanthomas). White or gray ring around the black center (pupil) of the eye. Very high triglyceride levels can cause inflammation of the pancreas (pancreatitis). How is this diagnosed? Your health care provider may diagnose dyslipidemia based on a routine blood test (fasting blood test). Because most people do not have symptoms of the condition, this blood testing (lipid profile) is done on adults age 70 and older and is repeated every 5 years. This test checks: Total cholesterol. This measures the total amount of cholesterol in your blood, including LDL cholesterol, HDL cholesterol, and triglycerides. A healthy number is below 200. Yours is 194 LDL cholesterol. The target number for LDL cholesterol is different for each person, depending on individual risk factors. Ask your health care provider what your LDL cholesterol should  be. Yours is 109 this is too high! HDL cholesterol. An HDL level of 60 or higher is best because it helps to protect against heart disease. A number below 40 for men or below 50 for women increases the risk for heart disease. Yours is 39 Triglycerides. A healthy triglyceride number is below 150. Yours is 231 If your lipid profile is abnormal, your health care provider may do other blood tests. How is this treated? Treatment depends on the type of dyslipidemia that you  have and your other risk factors for heart disease and stroke. Your health care provider will have a target range for your lipid levels based on this information. For many people, this condition may be treated by lifestyle changes, such as diet and exercise. Your health care provider may recommend that you: Get regular exercise. Make changes to your diet. Quit smoking if you smoke. If diet changes and exercise do not help you reach your goals, your health care provider may also prescribe medicine to lower lipids. The most commonly prescribed type of medicine lowers your LDL cholesterol (statin drug). If you have a high triglyceride level, your provider may prescribe another type of drug (fibrate) or an omega-3 fish oil supplement, or both. Follow these instructions at home:  Eating and drinking Follow instructions from your health care provider or dietitian about eating or drinking restrictions. Eat a healthy diet as told by your health care provider. This can help you reach and maintain a healthy weight, lower your LDL cholesterol, and raise your HDL cholesterol. This may include: Limiting your calories, if you are overweight. Eating more fruits, vegetables, whole grains, fish, and lean meats. Limiting saturated fat, trans fat, and cholesterol. If you drink alcohol: Limit how much you use. Be aware of how much alcohol is in your drink. In the [place] one drink equals one 12 oz bottle of beer (355 mL), one 5 oz glass of wine (148 mL), or one 1 oz glass of hard liquor (44 mL). Do not drink alcohol if: Your health care provider tells you not to drink. You are pregnant, may be pregnant, or are planning to become pregnant. Activity Get regular exercise. Start an exercise and strength training program as told by your health care provider. Ask your health care provider what activities are safe for you. Your health care provider may recommend: 30 minutes of aerobic activity 4-6 days a week. Brisk  walking is an example of aerobic activity. Strength training 2 days a week. General instructions Do not use any products that contain nicotine or tobacco, such as cigarettes, e-cigarettes, and chewing tobacco. If you need help quitting, ask your health care provider. Take over-the-counter and prescription medicines only as told by your health care provider. This includes supplements. Keep all follow-up visits as told by your health care provider. Contact a health care provider if: You are: Having trouble sticking to your exercise or diet plan. Struggling to quit smoking or control your use of alcohol. Summary Dyslipidemia often involves a high level of cholesterol or triglycerides, which are types of lipids. Treatment depends on the type of dyslipidemia that you have and your other risk factors for heart disease and stroke. For many people, treatment starts with lifestyle changes, such as diet and exercise. Your health care provider may prescribe medicine to lower lipids. This information is not intended to replace advice given to you by your health care provider. Make sure you discuss any questions you have with your health care provider. Document  Released: 01/15/2013 Document Revised: 09/04/2017 Document Reviewed: 08/11/2017 Elsevier Interactive Patient Education  2019 [employer] Inc[employer]   Fat and Cholesterol Restricted Eating Plan Getting too much fat and cholesterol in your diet may cause health problems. Choosing the right foods helps keep your fat and cholesterol at normal levels. This can keep you from getting certain diseases.  What are tips for following this plan? Meal planning  At meals, divide your plate into four equal parts: ? Fill one-half of your plate with vegetables and green salads. ? Fill one-fourth of your plate with whole grains. ? Fill one-fourth of your plate with low-fat (lean) protein foods.  Eat fish that is high in omega-3 fats at least two times a week. This  includes mackerel, tuna, sardines, and salmon.  Eat foods that are high in fiber, such as whole grains, beans, apples, broccoli, carrots, peas, and barley. General tips   Work with your doctor to lose weight if you need to.  Avoid: ? Foods with added sugar. ? Fried foods. ? Foods with partially hydrogenated oils.  Limit alcohol intake to no more than 1 drink a day for nonpregnant women and 2 drinks a day for men. One drink equals 12 oz of beer, 5 oz of wine, or 1 oz of hard liquor. Reading food labels  Check food labels for: ? Trans fats. ? Partially hydrogenated oils. ? Saturated fat (g) in each serving. ? Cholesterol (mg) in each serving. ? Fiber (g) in each serving.  Choose foods with healthy fats, such as: ? Monounsaturated fats. ? Polyunsaturated fats. ? Omega-3 fats.  Choose grain products that have whole grains. Look for the word "whole" as the first word in the ingredient list. Cooking  Cook foods using low-fat methods. These include baking, boiling, grilling, and broiling.  Eat more home-cooked foods. Eat at restaurants and buffets less often.  Avoid cooking using saturated fats, such as butter, cream, palm oil, palm kernel oil, and coconut oil.   Recommended foods Fruits  All fresh, canned (in natural juice), or frozen fruits. Vegetables  Fresh or frozen vegetables (raw, steamed, roasted, or grilled). Green salads. Grains  Whole grains, such as whole wheat or whole grain breads, crackers, cereals, and pasta. Unsweetened oatmeal, bulgur, barley, quinoa, or brown rice. Corn or whole wheat flour tortillas. Meats and other protein foods  Ground beef (85% or leaner), grass-fed beef, or beef trimmed of fat. Skinless chicken or [place]. Ground chicken or [place]. Pork trimmed of fat. All fish and seafood. Egg whites. Dried beans, peas, or lentils. Unsalted nuts or seeds. Unsalted canned beans. Nut butters without added sugar or oil. Dairy  Low-fat or nonfat  dairy products, such as skim or 1% milk, 2% or reduced-fat cheeses, low-fat and fat-free ricotta or cottage cheese, or plain low-fat and nonfat yogurt. Fats and oils  Tub margarine without trans fats. Light or reduced-fat mayonnaise and salad dressings. Avocado. Olive, canola, sesame, or safflower oils. The items listed above may not be a complete list of foods and beverages you can eat. Contact a dietitian for more information.   Foods to avoid Fruits  Canned fruit in heavy syrup. Fruit in cream or butter sauce. Fried fruit. Vegetables  Vegetables cooked in cheese, cream, or butter sauce. Fried vegetables. Grains  White bread. White pasta. White rice. Cornbread. Bagels, pastries, and croissants. Crackers and snack foods that contain trans fat and hydrogenated oils. Meats and other protein foods  Fatty cuts of meat. Ribs, chicken wings, bacon, sausage, bologna, salami,  chitterlings, fatback, hot dogs, bratwurst, and packaged lunch meats. Liver and organ meats. Whole eggs and egg yolks. Chicken and [place] with skin. Fried meat. Dairy  Whole or 2% milk, cream, half-and-half, and cream cheese. Whole milk cheeses. Whole-fat or sweetened yogurt. Full-fat cheeses. Nondairy creamers and whipped toppings. Processed cheese, cheese spreads, and cheese curds. Beverages  Alcohol. Sugar-sweetened drinks such as sodas, lemonade, and fruit drinks. Fats and oils  Butter, stick margarine, lard, shortening, ghee, or bacon fat. Coconut, palm kernel, and palm oils. Sweets and desserts  Corn syrup, sugars, honey, and molasses. Candy. Jam and jelly. Syrup. Sweetened cereals. Cookies, pies, cakes, donuts, muffins, and ice cream. The items listed above may not be a complete list of foods and beverages you should avoid. Contact a dietitian for more information. Summary  Choosing the right foods helps keep your fat and cholesterol at normal levels. This can keep you from getting certain diseases.  At  meals, fill one-half of your plate with vegetables and green salads.  Eat high-fiber foods, like whole grains, beans, apples, carrots, peas, and barley.  Limit added sugar, saturated fats, alcohol, and fried foods. This information is not intended to replace advice given to you by your health care provider. Make sure you discuss any questions you have with your health care provider. Document Released: 07/12/2011 Document Revised: 09/13/2017 Document Reviewed: 09/27/2016 Elsevier Interactive Patient Education  2019 [employer].

## 2018-08-06 ENCOUNTER — Telehealth: Payer: Self-pay | Admitting: Student in an Organized Health Care Education/Training Program

## 2018-08-06 NOTE — Telephone Encounter (Signed)
Patient called to see if Dr. Holley Raring can change how he billed the appt. For her procedure. It was billed as outpatient surgery and she was charged $395 copay by [facility]. She is upset because the injections cost over $3000 as well. Says our office should inform the patient how much our procedures cost and how much the insurance will not pay. Please let this patient know if anything can be done.

## 2018-08-14 ENCOUNTER — Other Ambulatory Visit: Payer: Medicare Other

## 2018-08-14 ENCOUNTER — Telehealth: Payer: Self-pay

## 2018-08-14 NOTE — Telephone Encounter (Signed)
Patient called in saying she should not have been billed for a surgery because she had an injection here. I gave her the billing office number.

## 2018-09-21 ENCOUNTER — Ambulatory Visit: Payer: Self-pay | Admitting: Family Medicine

## 2019-03-19 ENCOUNTER — Other Ambulatory Visit: Payer: Self-pay | Admitting: Family Medicine

## 2019-03-19 DIAGNOSIS — I10 Essential (primary) hypertension: Secondary | ICD-10-CM

## 2019-03-19 NOTE — Telephone Encounter (Signed)
Requested medication (s) are due for refill today: yes  Requested medication (s) are on the active medication list: yes  Last refill: 12/13/2018  Future visit scheduled: yes June 2021  Notes to clinic:  no valid encounter within the last 6 months    Requested Prescriptions  Pending Prescriptions Disp Refills   amLODipine (NORVASC) 5 MG tablet [Pharmacy Med Name: amLODIPine Besylate 5 MG Oral Tablet] 90 tablet 0    Sig: TAKE 1 TABLET BY MOUTH ONCE DAILY      Cardiovascular:  Calcium Channel Blockers Failed - 03/19/2019 12:20 PM      Failed - Valid encounter within last 6 months    Recent Outpatient Visits           9 months ago Hypertension, essential   [facility], Marzella Schlein, MD   11 months ago Hypertension, essential   [facility], Marzella Schlein, MD   1 year ago Hypertension, essential   [facility] [place], Marzella Schlein, MD   1 year ago Hypertension, essential   [facility] [place], Marzella Schlein, MD   1 year ago Hypertension, essential   [facility] [place], Marzella Schlein, MD       Future Appointments             In 3 months [facility]             Passed - Last BP in normal range    BP Readings from Last 1 Encounters:  06/22/18 132/66

## 2019-03-21 NOTE — Telephone Encounter (Signed)
Pt states she is completely out of this medication.  Please advise.  °

## 2019-06-25 ENCOUNTER — Other Ambulatory Visit: Payer: Self-pay | Admitting: Family Medicine

## 2019-06-25 DIAGNOSIS — I10 Essential (primary) hypertension: Secondary | ICD-10-CM

## 2019-06-25 MED ORDER — AMLODIPINE BESYLATE 5 MG PO TABS: 5.0000 mg | ORAL_TABLET | Freq: Every day | ORAL | 0 refills | Status: DC

## 2019-06-25 NOTE — Telephone Encounter (Signed)
amLODipine (NORVASC) 5 MG tablet Medication Date: 03/21/2019 Department: [facility] Ordering/Authorizing: Erasmo Downer, MD   [facility] [address] [place]), [place] - [address] Phone:  [phone]  Fax:  [phone]     Pt has made an appt for Wed June 9th. Pt states only 2 pills left

## 2019-06-25 NOTE — Telephone Encounter (Signed)
Patient has appointment schedule- #30 courtesy given

## 2019-06-27 ENCOUNTER — Telehealth: Payer: Self-pay

## 2019-06-27 ENCOUNTER — Ambulatory Visit: Payer: Medicare Other

## 2019-06-27 NOTE — Telephone Encounter (Signed)
Called pt to r/s previously cancelled AWV and pt declines and stated she wanted to wait to see what comes of her apt on 07/03/19 with PCP. Will make a note to f/u after that.

## 2019-07-03 ENCOUNTER — Other Ambulatory Visit: Payer: Self-pay

## 2019-07-03 ENCOUNTER — Encounter: Payer: Self-pay | Admitting: Family Medicine

## 2019-07-03 ENCOUNTER — Ambulatory Visit (INDEPENDENT_AMBULATORY_CARE_PROVIDER_SITE_OTHER): Payer: Medicare Other | Admitting: Family Medicine

## 2019-07-03 VITALS — BP 148/72 | HR 65 | Temp 97.9°F | Wt 131.0 lb

## 2019-07-03 DIAGNOSIS — E7800 Pure hypercholesterolemia, unspecified: Secondary | ICD-10-CM

## 2019-07-03 DIAGNOSIS — N1831 Chronic kidney disease, stage 3a: Secondary | ICD-10-CM | POA: Diagnosis not present

## 2019-07-03 DIAGNOSIS — I10 Essential (primary) hypertension: Secondary | ICD-10-CM | POA: Diagnosis not present

## 2019-07-03 DIAGNOSIS — D692 Other nonthrombocytopenic purpura: Secondary | ICD-10-CM

## 2019-07-03 NOTE — Assessment & Plan Note (Addendum)
Pt is non compliant with her atorvastatin.  Pt agreed to restart it.  Discussed importance of statin therapy for secondary prevention given her history of stroke Will recheck labs when fasting.

## 2019-07-03 NOTE — Patient Instructions (Signed)

## 2019-07-03 NOTE — Progress Notes (Signed)
I,Laura E Walsh,acting as a Neurosurgeon for Shirlee Latch, MD.,have documented all relevant documentation on the behalf of Shirlee Latch, MD,as directed by  Shirlee Latch, MD while in the presence of Shirlee Latch, MD.   Established patient visit   Patient: Gina [name]   DOB: 09/09/1940   79 y.o. Female  MRN: [id number] Visit Date: 07/03/2019  Today's healthcare provider: Shirlee Latch, MD   Chief Complaint  Patient presents with  . Hypertension   Subjective    HPI Hypertension, follow-up  BP Readings from Last 3 Encounters:  07/03/19 (!) 148/72  06/22/18 132/66  03/28/18 (!) 165/80   Wt Readings from Last 3 Encounters:  07/03/19 131 lb (59.4 kg)  03/28/18 140 lb 6.4 oz (63.7 kg)  03/12/18 142 lb (64.4 kg)     She was last seen for hypertension 12 months ago.  Management since that visit includes No changes.  She reports excellent compliance with treatment. She is not having side effects.  She is following a Regular diet. She is not exercising. She does not smoke.  Use of agents associated with hypertension: none.   Outside blood pressures are not being checked at home. Symptoms: No chest pain No chest pressure  No palpitations No syncope  No dyspnea No orthopnea  No paroxysmal nocturnal dyspnea No lower extremity edema   Pertinent labs: Lab Results  Component Value Date   CHOL 194 11/08/2017   HDL 39 (L) 11/08/2017   LDLCALC 109 (H) 11/08/2017   TRIG 231 (H) 11/08/2017   CHOLHDL 5.0 (H) 11/08/2017   Lab Results  Component Value Date   NA 141 11/08/2017   K 4.0 11/08/2017   CREATININE 0.96 11/08/2017   GFRNONAA 57 (L) 11/08/2017   GFRAA 66 11/08/2017   GLUCOSE 101 (H) 11/08/2017     The 10-year ASCVD risk score Denman George DC Jr., et al., 2013) is: 37.5%   ---------------------------------------------------------------------------------------------------    Patient Active Problem List   Diagnosis Date Noted  . Senile purpura (HCC)  07/03/2019  . SI joint arthritis 03/12/2018  . Lumbar spondylosis 03/12/2018  . Lumbar facet arthropathy 03/12/2018  . Dyspnea on exertion 04/28/2017  . Overactive bladder 04/28/2017  . CKD (chronic kidney disease) stage 3, GFR 30-59 ml/min 11/07/2016  . Long-term use of high-risk medication 07/25/2016  . Chronic pain syndrome 07/25/2016  . Hypertension, essential 03/17/2015  . Arthritis 09/01/2014  . 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 History of CVA (cerebrovascular accident) 07/18/2014  . Mononeuropathy of lower extremity 07/18/2014  . Adnexal mass 07/18/2014  . Avitaminosis D 07/18/2014  . Impacted cerumen of right ear 07/18/2014  . Disorder of mitral valve 04/21/2009  . 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 Allergic rhinitis 06/13/2008  . Acid reflux 08/04/2007  . Hypercholesteremia 08/03/2007   Social History   Tobacco Use  . Smoking status: Former Smoker    Packs/day: 0.50    Years: 5.00    Pack years: 2.50    Quit date: 01/23/1970    Years since quitting: 49.4  . Smokeless tobacco: Never Used  Vaping Use  . Vaping Use: Never used  Substance Use Topics  . Alcohol use: Yes    Comment: rare - 1 drink  . Drug use: No   Allergies  Allergen Reactions  . Hydrochlorothiazide   . Chlorthalidone Nausea Only  . Lisinopril Cough  . Penicillins Rash     Medications: Outpatient Medications Prior to Visit  Medication Sig  . acetaminophen (TYLENOL) 325 MG tablet  Take by mouth every 6 (six) hours as needed for pain.  Marland Kitchen amLODipine (NORVASC) 5 MG tablet Take 1 tablet (5 mg total) by mouth daily. Please schedule office visit with PCP, before any future refills.  Marland Kitchen aspirin 325 MG tablet Take 325 mg by mouth daily.  . cetirizine (ZYRTEC) 10 MG tablet Take 10 mg by mouth daily. Reported on 08/10/2015  . cholecalciferol (VITAMIN D) 1000 units tablet Take 2,000 Units by mouth daily.   Marland Kitchen losartan (COZAAR) 100 MG tablet Take 1 tablet (100 mg total) by mouth daily.  .  metoprolol (LOPRESSOR) 100 MG tablet Take 100 mg by mouth 2 (two) times daily.  Marland Kitchen atorvastatin (LIPITOR) 40 MG tablet Take 1 tablet (40 mg total) by mouth daily. (Patient not taking: Reported on 07/03/2018)  . [DISCONTINUED] mirabegron ER (MYRBETRIQ) 25 MG TB24 tablet Take 1 tablet (25 mg total) by mouth daily. (Patient not taking: Reported on 07/03/2018)  . [DISCONTINUED] ranitidine (ZANTAC) 150 MG tablet Take 150 mg by mouth daily.    No facility-administered medications prior to visit.    Review of Systems  Constitutional: Negative.   Respiratory: Negative.   Cardiovascular: Negative.   Gastrointestinal: Negative.   Allergic/Immunologic: Positive for environmental allergies.  Neurological: Negative for dizziness, light-headedness and headaches.    Last CBC Lab Results  Component Value Date   WBC 7.0 11/08/2017   HGB 13.7 11/08/2017   HCT 40.5 11/08/2017   MCV 88 11/08/2017   MCH 29.8 11/08/2017   RDW 12.8 11/08/2017   PLT 179 16/10/9602   Last metabolic panel Lab Results  Component Value Date   GLUCOSE 101 (H) 11/08/2017   NA 141 11/08/2017   K 4.0 11/08/2017   CL 102 11/08/2017   CO2 22 11/08/2017   BUN 21 11/08/2017   CREATININE 0.96 11/08/2017   GFRNONAA 57 (L) 11/08/2017   GFRAA 66 11/08/2017   CALCIUM 9.7 11/08/2017   PROT 7.5 11/08/2017   ALBUMIN 4.2 11/08/2017   LABGLOB 3.3 11/08/2017   AGRATIO 1.3 11/08/2017   BILITOT 0.4 11/08/2017   ALKPHOS 73 11/08/2017   AST 18 11/08/2017   ALT 22 11/08/2017   ANIONGAP 5 (L) 05/30/2012   Last lipids Lab Results  Component Value Date   CHOL 194 11/08/2017   HDL 39 (L) 11/08/2017   LDLCALC 109 (H) 11/08/2017   TRIG 231 (H) 11/08/2017   CHOLHDL 5.0 (H) 11/08/2017   Last hemoglobin A1c Lab Results  Component Value Date   HGBA1C 5.9 10/19/2012   Last thyroid functions Lab Results  Component Value Date   TSH 0.654 09/04/2014   Last vitamin D Lab Results  Component Value Date   VD25OH 30.5 11/08/2017    Last vitamin B12 and Folate No results found for: VITAMINB12, FOLATE  Objective    BP (!) 148/72 (BP Location: Right Arm, Patient Position: Sitting, Cuff Size: Large)   Pulse 65   Temp 97.9 F (36.6 C) (Temporal)   Wt 131 lb (59.4 kg)   BMI 22.49 kg/m  BP Readings from Last 3 Encounters:  07/03/19 (!) 148/72  06/22/18 132/66  03/28/18 (!) 165/80    Physical Exam Constitutional:      Appearance: Normal appearance.  Cardiovascular:     Rate and Rhythm: Normal rate and regular rhythm.     Pulses: Normal pulses.     Heart sounds: Normal heart sounds.  Pulmonary:     Effort: Pulmonary effort is normal.     Breath sounds: Normal breath sounds.  Abdominal:     Palpations: Abdomen is soft.  Skin:    General: Skin is warm and dry.  Neurological:     Mental Status: She is alert and oriented to person, place, and time. Mental status is at baseline.  Psychiatric:        Mood and Affect: Mood normal.        Behavior: Behavior normal.        Thought Content: Thought content normal.        Judgment: Judgment normal.    No results found for any visits on 07/03/19.  Assessment & Plan     Problem List Items Addressed This Visit      Cardiovascular and Mediastinum   Hypertension, essential - Primary    Patient continues to report that her blood pressure is well controlled on home readings Her blood pressure remains elevated today Uncontrolled She does have a higher goal of less than 150/90 due to advanced age and fall risk We will continue current medications as patient is declining any further medication changes She may have some amount of whitecoat hypertension, but I remain concerned about her compliance with medications Recheck metabolic panel      Relevant Orders   Comprehensive metabolic panel   Lipid panel   Senile purpura (HCC)    Stable Continue to monitor        Genitourinary   CKD (chronic kidney disease) stage 3, GFR 30-59 ml/min    Recheck metabolic  panel Avoid nephrotoxic medications      Relevant Orders   Comprehensive metabolic panel     Other   Hypercholesteremia    Pt is non compliant with her atorvastatin.  Pt agreed to restart it.  Discussed importance of statin therapy for secondary prevention given her history of stroke Will recheck labs when fasting.       Relevant Orders   Comprehensive metabolic panel   Lipid panel       Return in about 6 months (around 01/02/2020) for AWV and Physical .      I, Shirlee Latch, MD, have reviewed all documentation for this visit. The documentation on 07/04/19 for the exam, diagnosis, procedures, and orders are all accurate and complete.   Rickiya Picariello, Marzella Schlein, MD, MPH [facility]

## 2019-07-04 NOTE — Assessment & Plan Note (Signed)
Patient continues to report that her blood pressure is well controlled on home readings Her blood pressure remains elevated today Uncontrolled She does have a higher goal of less than 150/90 due to advanced age and fall risk We will continue current medications as patient is declining any further medication changes She may have some amount of whitecoat hypertension, but I remain concerned about her compliance with medications Recheck metabolic panel

## 2019-07-04 NOTE — Assessment & Plan Note (Signed)
Stable.       - Continue to monitor

## 2019-07-04 NOTE — Assessment & Plan Note (Signed)
Recheck metabolic panel.  Avoid nephrotoxic medications.   

## 2019-07-05 ENCOUNTER — Other Ambulatory Visit: Payer: Self-pay | Admitting: Family Medicine

## 2019-07-05 DIAGNOSIS — I10 Essential (primary) hypertension: Secondary | ICD-10-CM

## 2019-07-05 MED ORDER — AMLODIPINE BESYLATE 5 MG PO TABS: 5.0000 mg | ORAL_TABLET | Freq: Every day | ORAL | 1 refills | Status: DC

## 2019-07-05 NOTE — Telephone Encounter (Signed)
Patient requesting the following medication refill metoprolol (LOPRESSOR) 100 MG tablet, losartan (COZAAR) 100 MG tablet , amLODipine (NORVASC) 5 MG tablet . Informed patient please allow 48 to 72 hour turn around.   [facility] [address] ([address]), [place] - [address] Phone:  [phone]  Fax:  [phone]

## 2019-07-05 NOTE — Telephone Encounter (Signed)
Patient was seen 6/9- advised to continue medication and return in 6 months. Refill to reflect that

## 2019-07-05 NOTE — Telephone Encounter (Signed)
Requested medication (s) are due for refill today- expired Rx. Historical Rx  Requested medication (s) are on the active medication list - yes  Future visit scheduled -yes  Last refill: 1 year  Notes to clinic: Patient is requesting expired Rx- fails lab protocol( labs ordered- but not drawn), historical Rx  Requested Prescriptions  Pending Prescriptions Disp Refills   metoprolol tartrate (LOPRESSOR) 100 MG tablet      Sig: Take 1 tablet (100 mg total) by mouth 2 (two) times daily.      Cardiovascular:  Beta Blockers Failed - 07/05/2019 11:16 AM      Failed - Last BP in normal range    BP Readings from Last 1 Encounters:  07/03/19 (!) 148/72          Passed - Last Heart Rate in normal range    Pulse Readings from Last 1 Encounters:  07/03/19 65          Passed - Valid encounter within last 6 months    Recent Outpatient Visits           2 days ago Hypertension, essential   [facility] [place], Dionne Bucy, MD   1 year ago Hypertension, essential   [facility], Dionne Bucy, MD   1 year ago Hypertension, essential   [facility], Dionne Bucy, MD   1 year ago Hypertension, essential   [place], Dionne Bucy, MD   1 year ago Hypertension, essential   [facility], Dionne Bucy, MD                losartan (COZAAR) 100 MG tablet 90 tablet 3    Sig: Take 1 tablet (100 mg total) by mouth daily.      Cardiovascular:  Angiotensin Receptor Blockers Failed - 07/05/2019 11:16 AM      Failed - Cr in normal range and within 180 days    Creat  Date Value Ref Range Status  11/04/2016 1.07 (H) 0.60 - 0.93 mg/dL Final    Comment:    For patients >60 years of age, the reference limit for Creatinine is approximately 13% higher for people identified as African-American. .    Creatinine, Ser  Date Value Ref Range Status  11/08/2017 0.96 0.57 - 1.00 mg/dL Final           Failed - K in normal range and within 180 days    Potassium  Date Value Ref Range Status  11/08/2017 4.0 3.5 - 5.2 mmol/L Final  05/30/2012 4.2 3.5 - 5.1 mmol/L Final          Failed - Last BP in normal range    BP Readings from Last 1 Encounters:  07/03/19 (!) 148/72          Passed - Patient is not pregnant      Passed - Valid encounter within last 6 months    Recent Outpatient Visits           2 days ago Hypertension, essential   [facility] [place], Dionne Bucy, MD   1 year ago Hypertension, essential   [facility], Dionne Bucy, MD   1 year ago Hypertension, essential   [facility] [place], Dionne Bucy, MD   1 year ago Hypertension, essential   [facility] [place], Dionne Bucy, MD   1 year ago Hypertension, essential   [facility], Dionne Bucy, MD  Signed Prescriptions Disp Refills   amLODipine (NORVASC) 5 MG tablet 90 tablet 1    Sig: Take 1 tablet (5 mg total) by mouth daily.      Cardiovascular:  Calcium Channel Blockers Failed - 07/05/2019 11:16 AM      Failed - Last BP in normal range    BP Readings from Last 1 Encounters:  07/03/19 (!) 148/72          Passed - Valid encounter within last 6 months    Recent Outpatient Visits           2 days ago Hypertension, essential   [facility] [place], Marzella Schlein, MD   1 year ago Hypertension, essential   [facility], Marzella Schlein, MD   1 year ago Hypertension, essential   [facility], Marzella Schlein, MD   1 year ago Hypertension, essential   [facility], Marzella Schlein, MD   1 year ago Hypertension, essential   [facility], Marzella Schlein, MD                  Requested Prescriptions  Pending Prescriptions Disp Refills   metoprolol tartrate (LOPRESSOR) 100 MG tablet      Sig: Take 1 tablet  (100 mg total) by mouth 2 (two) times daily.      Cardiovascular:  Beta Blockers Failed - 07/05/2019 11:16 AM      Failed - Last BP in normal range    BP Readings from Last 1 Encounters:  07/03/19 (!) 148/72          Passed - Last Heart Rate in normal range    Pulse Readings from Last 1 Encounters:  07/03/19 65          Passed - Valid encounter within last 6 months    Recent Outpatient Visits           2 days ago Hypertension, essential   [facility] [place], Marzella Schlein, MD   1 year ago Hypertension, essential   [facility], Marzella Schlein, MD   1 year ago Hypertension, essential   [facility], Marzella Schlein, MD   1 year ago Hypertension, essential   [facility] [place], Marzella Schlein, MD   1 year ago Hypertension, essential   [facility] [place], Marzella Schlein, MD                losartan (COZAAR) 100 MG tablet 90 tablet 3    Sig: Take 1 tablet (100 mg total) by mouth daily.      Cardiovascular:  Angiotensin Receptor Blockers Failed - 07/05/2019 11:16 AM      Failed - Cr in normal range and within 180 days    Creat  Date Value Ref Range Status  11/04/2016 1.07 (H) 0.60 - 0.93 mg/dL Final    Comment:    For patients >1 years of age, the reference limit for Creatinine is approximately 13% higher for people identified as African-American. .    Creatinine, Ser  Date Value Ref Range Status  11/08/2017 0.96 0.57 - 1.00 mg/dL Final          Failed - K in normal range and within 180 days    Potassium  Date Value Ref Range Status  11/08/2017 4.0 3.5 - 5.2 mmol/L Final  05/30/2012 4.2 3.5 - 5.1 mmol/L Final          Failed - Last BP in normal  range    BP Readings from Last 1 Encounters:  07/03/19 (!) 148/72          Passed - Patient is not pregnant      Passed - Valid encounter within last 6 months    Recent Outpatient Visits           2 days ago Hypertension,  essential   [facility] [place], Marzella Schlein, MD   1 year ago Hypertension, essential   [facility], Marzella Schlein, MD   1 year ago Hypertension, essential   [facility], Marzella Schlein, MD   1 year ago Hypertension, essential   [facility], Marzella Schlein, MD   1 year ago Hypertension, essential   [facility], Marzella Schlein, MD               Signed Prescriptions Disp Refills   amLODipine (NORVASC) 5 MG tablet 90 tablet 1    Sig: Take 1 tablet (5 mg total) by mouth daily.      Cardiovascular:  Calcium Channel Blockers Failed - 07/05/2019 11:16 AM      Failed - Last BP in normal range    BP Readings from Last 1 Encounters:  07/03/19 (!) 148/72          Passed - Valid encounter within last 6 months    Recent Outpatient Visits           2 days ago Hypertension, essential   [facility] [place], Marzella Schlein, MD   1 year ago Hypertension, essential   [facility], Marzella Schlein, MD   1 year ago Hypertension, essential   [facility], Marzella Schlein, MD   1 year ago Hypertension, essential   [facility], Marzella Schlein, MD   1 year ago Hypertension, essential   [facility], Marzella Schlein, MD

## 2019-07-08 ENCOUNTER — Telehealth: Payer: Self-pay

## 2019-07-08 NOTE — Telephone Encounter (Signed)
Copied from CRM [id number]. Topic: General - Other >> Jul 08, 2019  1:10 PM Lyn Hollingshead D wrote: Reason for CRM: PT has questions about medications, want to speak with a nurse about it / please advise

## 2019-07-09 MED ORDER — LOSARTAN POTASSIUM 100 MG PO TABS: 100.0000 mg | ORAL_TABLET | Freq: Every day | ORAL | 1 refills | Status: DC

## 2019-07-09 MED ORDER — METOPROLOL TARTRATE 100 MG PO TABS: 100.0000 mg | ORAL_TABLET | Freq: Two times a day (BID) | ORAL | 1 refills | Status: DC

## 2019-07-10 MED ORDER — ATORVASTATIN CALCIUM 40 MG PO TABS: 40.0000 mg | ORAL_TABLET | Freq: Every day | ORAL | 3 refills | Status: DC

## 2019-07-10 NOTE — Addendum Note (Signed)
Addended by: Hyacinth Meeker on: 07/10/2019 10:15 AM   Modules accepted: Orders

## 2019-07-16 DIAGNOSIS — I10 Essential (primary) hypertension: Secondary | ICD-10-CM | POA: Diagnosis not present

## 2019-07-16 DIAGNOSIS — E7800 Pure hypercholesterolemia, unspecified: Secondary | ICD-10-CM | POA: Diagnosis not present

## 2019-07-16 DIAGNOSIS — N1831 Chronic kidney disease, stage 3a: Secondary | ICD-10-CM | POA: Diagnosis not present

## 2019-07-17 LAB — COMPREHENSIVE METABOLIC PANEL
ALT: 19 IU/L (ref 0–32)
AST: 16 IU/L (ref 0–40)
Albumin/Globulin Ratio: 1.1 — ABNORMAL LOW (ref 1.2–2.2)
Albumin: 3.8 g/dL (ref 3.7–4.7)
Alkaline Phosphatase: 70 IU/L (ref 48–121)
BUN/Creatinine Ratio: 18 (ref 12–28)
BUN: 22 mg/dL (ref 8–27)
Bilirubin Total: 0.4 mg/dL (ref 0.0–1.2)
CO2: 21 mmol/L (ref 20–29)
Calcium: 9.7 mg/dL (ref 8.7–10.3)
Chloride: 105 mmol/L (ref 96–106)
Creatinine, Ser: 1.25 mg/dL — ABNORMAL HIGH (ref 0.57–1.00)
GFR calc Af Amer: 47 mL/min/{1.73_m2} — ABNORMAL LOW (ref >59)
GFR calc non Af Amer: 41 mL/min/{1.73_m2} — ABNORMAL LOW (ref >59)
Globulin, Total: 3.6 g/dL (ref 1.5–4.5)
Glucose: 104 mg/dL — ABNORMAL HIGH (ref 65–99)
Potassium: 4.4 mmol/L (ref 3.5–5.2)
Sodium: 140 mmol/L (ref 134–144)
Total Protein: 7.4 g/dL (ref 6.0–8.5)

## 2019-07-17 LAB — LIPID PANEL
Chol/HDL Ratio: 5.3 ratio — ABNORMAL HIGH (ref 0.0–4.4)
Cholesterol, Total: 184 mg/dL (ref 100–199)
HDL: 35 mg/dL — ABNORMAL LOW (ref >39)
LDL Chol Calc (NIH): 119 mg/dL — ABNORMAL HIGH (ref 0–99)
Triglycerides: 169 mg/dL — ABNORMAL HIGH (ref 0–149)
VLDL Cholesterol Cal: 30 mg/dL (ref 5–40)

## 2019-07-19 ENCOUNTER — Telehealth: Payer: Self-pay

## 2019-07-19 DIAGNOSIS — I10 Essential (primary) hypertension: Secondary | ICD-10-CM

## 2019-07-19 DIAGNOSIS — N1831 Chronic kidney disease, stage 3a: Secondary | ICD-10-CM

## 2019-07-19 NOTE — Telephone Encounter (Signed)
-----   Message from Erasmo Downer, MD sent at 07/18/2019  1:21 PM EDT ----- Stable labs, except for slight worsening of kidney function.  These labs all likely represent not taking her cholesterol medication and her blood pressure running high.  Please stressed the importance of taking her medication regularly every single day.  Please have her return for a BMP to recheck her kidney function in 1 month.

## 2019-07-19 NOTE — Telephone Encounter (Signed)
LMTCB, PEC may give result.  

## 2019-07-19 NOTE — Telephone Encounter (Signed)
Pt. Given results and instructions. Will come in 1 month for repeat lab work.

## 2019-08-01 NOTE — Telephone Encounter (Signed)
Pt is scheduled 12/23/19 @ 2:40 PM.

## 2019-08-27 DIAGNOSIS — N1831 Chronic kidney disease, stage 3a: Secondary | ICD-10-CM | POA: Diagnosis not present

## 2019-08-27 DIAGNOSIS — I10 Essential (primary) hypertension: Secondary | ICD-10-CM | POA: Diagnosis not present

## 2019-08-28 LAB — BASIC METABOLIC PANEL
BUN/Creatinine Ratio: 17 (ref 12–28)
BUN: 21 mg/dL (ref 8–27)
CO2: 20 mmol/L (ref 20–29)
Calcium: 10.2 mg/dL (ref 8.7–10.3)
Chloride: 105 mmol/L (ref 96–106)
Creatinine, Ser: 1.21 mg/dL — ABNORMAL HIGH (ref 0.57–1.00)
GFR calc Af Amer: 49 mL/min/{1.73_m2} — ABNORMAL LOW (ref >59)
GFR calc non Af Amer: 43 mL/min/{1.73_m2} — ABNORMAL LOW (ref >59)
Glucose: 109 mg/dL — ABNORMAL HIGH (ref 65–99)
Potassium: 4.5 mmol/L (ref 3.5–5.2)
Sodium: 141 mmol/L (ref 134–144)

## 2019-08-30 ENCOUNTER — Telehealth: Payer: Self-pay

## 2019-08-30 NOTE — Telephone Encounter (Signed)
Left message form is placed up front ready for pick up. (Parking Placard).

## 2019-09-02 ENCOUNTER — Telehealth: Payer: Self-pay

## 2019-09-02 NOTE — Telephone Encounter (Signed)
Patient advised as below.  

## 2019-09-02 NOTE — Telephone Encounter (Signed)
-----   Message from Erasmo Downer, MD sent at 09/02/2019  8:27 AM EDT ----- Stable labs

## 2019-09-02 NOTE — Telephone Encounter (Signed)
-----   Message from Angela M Bacigalupo, MD sent at 09/02/2019  8:27 AM EDT ----- Stable labs 

## 2019-12-23 ENCOUNTER — Encounter: Payer: Medicare Other | Admitting: Family Medicine

## 2020-02-11 ENCOUNTER — Other Ambulatory Visit: Payer: Self-pay | Admitting: Family Medicine

## 2020-02-11 DIAGNOSIS — I10 Essential (primary) hypertension: Secondary | ICD-10-CM

## 2020-03-16 NOTE — Progress Notes (Signed)
Subjective:   Gina [name] is a 80 y.o. female who presents for Medicare Annual (Subsequent) preventive examination.  I connected with Gina [name] today by telephone and verified that I am speaking with the correct person using two identifiers. Location patient: home Location provider: work Persons participating in the virtual visit: patient, provider.   I discussed the limitations, risks, security and privacy concerns of performing an evaluation and management service by telephone and the availability of in person appointments. I also discussed with the patient that there may be a patient responsible charge related to this service. The patient expressed understanding and verbally consented to this telephonic visit.    Interactive audio and video telecommunications were attempted between this provider and patient, however failed, due to patient having technical difficulties OR patient did not have access to video capability.  We continued and completed visit with audio only.   Review of Systems    N/A  Cardiac Risk Factors include: advanced age (>1455men, 31>65 women)     Objective:    There were no vitals filed for this visit. There is no height or weight on file to calculate BMI.  Advanced Directives 03/17/2020 07/03/2018 06/26/2018 12/12/2017 06/02/2017 09/01/2014  Does Patient Have a Medical Advance Directive? No No No No No No  Would patient like information on creating a medical advance directive? No - Patient declined Yes (MAU/Ambulatory/Procedural Areas - Information given) No - Patient declined No - Patient declined Yes (MAU/Ambulatory/Procedural Areas - Information given) -    Current Medications (verified) Outpatient Encounter Medications as of 03/17/2020  Medication Sig   acetaminophen (TYLENOL) 325 MG tablet Take by mouth every 6 (six) hours as needed for pain.   amLODipine (NORVASC) 5 MG tablet Take 1 tablet by mouth once daily   aspirin 325 MG tablet Take 325 mg by mouth daily.    cetirizine (ZYRTEC) 10 MG tablet Take 10 mg by mouth daily. Reported on 08/10/2015   cholecalciferol (VITAMIN D) 1000 units tablet Take 2,000 Units by mouth daily.    losartan (COZAAR) 100 MG tablet Take 1 tablet by mouth once daily   metoprolol tartrate (LOPRESSOR) 100 MG tablet Take 1 tablet by mouth twice daily   atorvastatin (LIPITOR) 40 MG tablet Take 1 tablet (40 mg total) by mouth daily. (Patient not taking: Reported on 03/17/2020)   No facility-administered encounter medications on file as of 03/17/2020.    Allergies (verified) Hydrochlorothiazide, Chlorthalidone, Lisinopril, and Penicillins   History: Past Medical History:  Diagnosis Date   Abnormal blood sugar 08/03/2007   Hyperlipidemia    Hypertension 2010   Mini stroke (HCC) 2010, 2014   Past Surgical History:  Procedure Laterality Date   BREAST SURGERY Left 1982?   biopsy   CESAREAN SECTION     HERNIA REPAIR Left 2014   Laparoscopic placement  10 x 15 cm Physiomesh at lateral abdominal wall port site hernia   HIP SURGERY Left 2013   OOPHORECTOMY  2013   Family History  Problem Relation Age of Onset   Congestive Heart Failure Mother [id number]       Glucose intolerance   Heart disease Father 7952       Valvular   Rheumatic fever Father    Heart failure Brother 4677       heart valve   Rheumatic fever Sister    Social History   Socioeconomic History   Marital status: Widowed    Spouse name: Not on file   Number of children: 1  Years of education: Not on file   Highest education level: 12th grade  Occupational History   Occupation: retired  Tobacco Use   Smoking status: Former Smoker    Packs/day: 0.50    Years: 5.00    Pack years: 2.50    Quit date: 01/23/1970    Years since quitting: 50.1   Smokeless tobacco: Never Used  Vaping Use   Vaping Use: Never used  Substance and Sexual Activity   Alcohol use: Yes    Comment: rare - 1 drink   Drug use: No   Sexual activity: Not  Currently  Other Topics Concern   Not on file  Social History Narrative   Not on file   Social Determinants of Health   Financial Resource Strain: Low Risk    Difficulty of Paying Living Expenses: Not hard at all  Food Insecurity: No Food Insecurity   Worried About Programme researcher, broadcasting/film/video in the Last Year: Never true   Ran Out of Food in the Last Year: Never true  Transportation Needs: No Transportation Needs   Lack of Transportation (Medical): No   Lack of Transportation (Non-Medical): No  Physical Activity: Inactive   Days of Exercise per Week: 0 days   Minutes of Exercise per Session: 0 min  Stress: Stress Concern Present   Feeling of Stress : To some extent  Social Connections: Moderately Isolated   Frequency of Communication with Friends and Family: More than three times a week   Frequency of Social Gatherings with Friends and Family: More than three times a week   Attends Religious Services: More than 4 times per year   Active Member of [employer] or Organizations: No   Attends Banker Meetings: Never   Marital Status: Widowed    Tobacco Counseling Counseling given: Not Answered   Clinical Intake:  Pre-visit preparation completed: Yes  Pain : No/denies pain (Has left leg pain when up and moving around.)     Nutritional Risks: None Diabetes: No  How often do you need to have someone help you when you read instructions, pamphlets, or other written materials from your doctor or pharmacy?: 1 - Never  Diabetic? No  Interpreter Needed?: No  Information entered by :: [facility], LPN   Activities of Daily Living In your present state of health, do you have any difficulty performing the following activities: 03/17/2020 07/03/2019  Hearing? Y Y  Comment Does not wear hearing aids. -  Vision? N N  Difficulty concentrating or making decisions? N N  Walking or climbing stairs? N Y  Dressing or bathing? N Y  Doing errands, shopping? N Y   Quarry manager and eating ? N -  Using the Toilet? N -  In the past six months, have you accidently leaked urine? Y -  Comment Occasionally with urges. Wears pads to bed. -  Do you have problems with loss of bowel control? N -  Managing your Medications? N -  Managing your Finances? N -  Housekeeping or managing your Housekeeping? N -  Some recent data might be hidden    Patient Care Team: Erasmo Downer, MD as PCP - General (Family Medicine) Lady Gary Darlin Priestly, MD as Consulting Physician (Cardiology) [facility], [facility])  Indicate any recent Medical Services you may have received from other than [facility] providers in the past year (date may be approximate).     Assessment:   This is a routine wellness examination for [facility].  Hearing/Vision screen No  exam data present  Dietary issues and exercise activities discussed: Current Exercise Habits: The patient does not participate in regular exercise at present, Exercise limited by: orthopedic condition(s)  Goals      Patient Stated     I really don't know anything about cholesterol or where it comes from (pt-stated)      Current Barriers:   Knowledge Deficits related to understanding hyperlipidemia  Nurse Case Manager Clinical Goal(s):   Over the next 14 days, patient will demonstrate improved adherence to prescribed treatment plan for elevated cholesterol  as evidenced byadherence to statin therapy, daily exercise, and low cholesterol diet  Interventions:   Evaluation of current treatment plan related to dyslipidemia and patient's adherence to plan as established by provider.  Provided education to patient re: dislipidemia and low cholesterol diet  Reviewed medications with patient and discussed importance of compliance with statin therapy and importance of discussing potential side effects with provider  Discussed plans with patient for ongoing care management follow up and provided patient with direct  contact information for care management team  Advised patient, providing education and rationale, to monitor blood pressure daily and record  Provided patient with written educational materials related to dyslipidemia diet and self care management  Patient Self Care Activities:   Self administers medications as prescribed  Attends all scheduled provider appointments  Calls pharmacy for medication refills  Attends church or other social activities  Performs ADL's independently  Performs IADL's independently  Calls provider office for new concerns or questions  Initial goal documentation       Other     DIET - INCREASE WATER INTAKE      Recommend increasing water intake to 6-8 8 oz glasses a day.       LIFESTYLE - DECREASE FALLS RISK      Recommend to remove any items from the home that may cause slips or trips.      Depression Screen PHQ 2/9 Scores 03/17/2020 07/03/2019 03/12/2018 01/10/2018 12/12/2017 06/02/2017 06/02/2017  PHQ - 2 Score 0 0 0 0 0 0 0  PHQ- 9 Score - 0 - - - 1 -    Fall Risk Fall Risk  03/17/2020 07/03/2019 06/22/2018 03/12/2018 01/10/2018  Falls in the past year? 1 0 0 0 0  Number falls in past yr: 0 0 - - -  Injury with Fall? 0 0 - - -  Comment - - - - -  Risk for fall due to : - - - - -  Follow up Falls prevention discussed Falls evaluation completed - - -    FALL RISK PREVENTION PERTAINING TO THE HOME:  Any stairs in or around the home? Yes  If so, are there any without handrails? No  Home free of loose throw rugs in walkways, pet beds, electrical cords, etc? Yes  Adequate lighting in your home to reduce risk of falls? Yes   ASSISTIVE DEVICES UTILIZED TO PREVENT FALLS:  Life alert? No  Use of a cane, walker or w/c? Yes  Grab bars in the bathroom? Yes  Shower chair or bench in shower? Yes  Elevated toilet seat or a handicapped toilet? No    Cognitive Function: Normal cognitive status assessed by observation by this Nurse Health Advisor. No  abnormalities found.          Immunizations  There is no immunization history on file for this patient.  TDAP status: Due, Education has been provided regarding the importance of this vaccine. Advised may receive  this vaccine at local pharmacy or Health Dept. Aware to provide a copy of the vaccination record if obtained from local pharmacy or Health Dept. Verbalized acceptance and understanding.  Flu Vaccine status: Declined, Education has been provided regarding the importance of this vaccine but patient still declined. Advised may receive this vaccine at local pharmacy or Health Dept. Aware to provide a copy of the vaccination record if obtained from local pharmacy or Health Dept. Verbalized acceptance and understanding.  Pneumococcal vaccine status: Due, Education has been provided regarding the importance of this vaccine. Advised may receive this vaccine at local pharmacy or Health Dept. Aware to provide a copy of the vaccination record if obtained from local pharmacy or Health Dept. Verbalized acceptance and understanding.  Covid-19 vaccine status: Declined, Education has been provided regarding the importance of this vaccine but patient still declined. Advised may receive this vaccine at local pharmacy or Health Dept.or vaccine clinic. Aware to provide a copy of the vaccination record if obtained from local pharmacy or Health Dept. Verbalized acceptance and understanding.  Qualifies for Shingles Vaccine? Yes   Zostavax completed No   Shingrix Completed?: No.    Education has been provided regarding the importance of this vaccine. Patient has been advised to call insurance company to determine out of pocket expense if they have not yet received this vaccine. Advised may also receive vaccine at local pharmacy or Health Dept. Verbalized acceptance and understanding.  Screening Tests Health Maintenance  Topic Date Due   DEXA SCAN  Never done   COVID-19 Vaccine (1) 04/02/2020 (Originally  01/26/1945)   INFLUENZA VACCINE  04/23/2020 (Originally 08/25/2019)   PNA vac Low Risk Adult (1 of 2 - PCV13) 03/17/2021 (Originally 01/26/2005)   TETANUS/TDAP  10/31/2022 (Originally 01/27/1959)    Health Maintenance  Health Maintenance Due  Topic Date Due   DEXA SCAN  Never done    Colorectal cancer screening: No longer required.   Mammogram status: No longer required due to age.  Bone Density status: Currently due, declined order today.   Lung Cancer Screening: (Low Dose CT Chest recommended if Age 50-80 years, 30 pack-year currently smoking OR have quit w/in 15years.) does not qualify.   Additional Screening:  Vision Screening: Recommended annual ophthalmology exams for early detection of glaucoma and other disorders of the eye. Is the patient up to date with their annual eye exam?  Yes  Who is the provider or what is the name of the office in which the patient attends annual eye exams? [facility]  If pt is not established with a provider, would they like to be referred to a provider to establish care? No .   Dental Screening: Recommended annual dental exams for proper oral hygiene  Community Resource Referral / Chronic Care Management: CRR required this visit?  No   CCM required this visit?  No      Plan:     I have personally reviewed and noted the following in the patients chart:    Medical and social history  Use of alcohol, tobacco or illicit drugs   Current medications and supplements  Functional ability and status  Nutritional status  Physical activity  Advanced directives  List of other physicians  Hospitalizations, surgeries, and ER visits in previous 12 months  Vitals  Screenings to include cognitive, depression, and falls  Referrals and appointments  In addition, I have reviewed and discussed with patient certain preventive protocols, quality metrics, and best practice recommendations. A written personalized care plan for  preventive services as  well as general preventive health recommendations were provided to patient.     Gina [name], [place]   09/[birth year]95   Nurse Notes: Pt declined a DEXA order today or a future flu or Covid vaccine.

## 2020-03-17 ENCOUNTER — Other Ambulatory Visit: Payer: Self-pay

## 2020-03-17 ENCOUNTER — Ambulatory Visit (INDEPENDENT_AMBULATORY_CARE_PROVIDER_SITE_OTHER): Payer: Medicare Other

## 2020-03-17 ENCOUNTER — Encounter: Payer: Medicare Other | Admitting: Family Medicine

## 2020-03-17 DIAGNOSIS — Z0000 Encounter for general adult medical examination without abnormal findings: Secondary | ICD-10-CM | POA: Diagnosis not present

## 2020-03-17 NOTE — Patient Instructions (Signed)
Gina [name] , Thank you for taking time to come for your Medicare Wellness Visit. I appreciate your ongoing commitment to your health goals. Please review the following plan we discussed and let me know if I can assist you in the future.   Screening recommendations/referrals: Colonoscopy: No longer required.  Mammogram: No longer required.  Bone Density: Currently due, declined order today.  Recommended yearly ophthalmology/optometry visit for glaucoma screening and checkup Recommended yearly dental visit for hygiene and checkup  Vaccinations: Influenza vaccine: Currently due, declined receiving.  Pneumococcal vaccine: Currently due, declined receiving.  Tdap vaccine: Currently due, declined receiving.  Shingles vaccine: Shingrix discussed. Please contact your pharmacy for coverage information.     Advanced directives: Advance directive discussed with you today. Even though you declined this today please call our office should you change your mind and we can give you the proper paperwork for you to fill out.  Conditions/risks identified: Fall risk preventatives discussed today. Recommend to increase water intake to 6-8 8 oz glasses a day.   Next appointment: 03/23/21 @ 2:00 PM for an AWV call.    Preventive Care 80 Years and Older, Female Preventive care refers to lifestyle choices and visits with your health care provider that can promote health and wellness. What does preventive care include?  A yearly physical exam. This is also called an annual well check.  Dental exams once or twice a year.  Routine eye exams. Ask your health care provider how often you should have your eyes checked.  Personal lifestyle choices, including:  Daily care of your teeth and gums.  Regular physical activity.  Eating a healthy diet.  Avoiding tobacco and drug use.  Limiting alcohol use.  Practicing safe sex.  Taking low-dose aspirin every day.  Taking vitamin and mineral supplements as  recommended by your health care provider. What happens during an annual well check? The services and screenings done by your health care provider during your annual well check will depend on your age, overall health, lifestyle risk factors, and family history of disease. Counseling  Your health care provider may ask you questions about your:  Alcohol use.  Tobacco use.  Drug use.  Emotional well-being.  Home and relationship well-being.  Sexual activity.  Eating habits.  History of falls.  Memory and ability to understand (cognition).  Work and work Astronomer.  Reproductive health. Screening  You may have the following tests or measurements:  Height, weight, and BMI.  Blood pressure.  Lipid and cholesterol levels. These may be checked every 5 years, or more frequently if you are over 80 years old.  Skin check.  Lung cancer screening. You may have this screening every year starting at age 65 if you have a 30-pack-year history of smoking and currently smoke or have quit within the past 15 years.  Fecal occult blood test (FOBT) of the stool. You may have this test every year starting at age 80.  Flexible sigmoidoscopy or colonoscopy. You may have a sigmoidoscopy every 5 years or a colonoscopy every 10 years starting at age 80.  Hepatitis C blood test.  Hepatitis B blood test.  Sexually transmitted disease (STD) testing.  Diabetes screening. This is done by checking your blood sugar (glucose) after you have not eaten for a while (fasting). You may have this done every 1-3 years.  Bone density scan. This is done to screen for osteoporosis. You may have this done starting at age 80.  Mammogram. This may be done every 1-2  years. Talk to your health care provider about how often you should have regular mammograms. Talk with your health care provider about your test results, treatment options, and if necessary, the need for more tests. Vaccines  Your health care  provider may recommend certain vaccines, such as:  Influenza vaccine. This is recommended every year.  Tetanus, diphtheria, and acellular pertussis (Tdap, Td) vaccine. You may need a Td booster every 10 years.  Zoster vaccine. You may need this after age 80.  Pneumococcal 13-valent conjugate (PCV13) vaccine. One dose is recommended after age 80.  Pneumococcal polysaccharide (PPSV23) vaccine. One dose is recommended after age 80. Talk to your health care provider about which screenings and vaccines you need and how often you need them. This information is not intended to replace advice given to you by your health care provider. Make sure you discuss any questions you have with your health care provider. Document Released: 02/06/2015 Document Revised: 09/30/2015 Document Reviewed: 11/11/2014 Elsevier Interactive Patient Education  2017 [place] Prevention in the Home Falls can cause injuries. They can happen to people of all ages. There are many things you can do to make your home safe and to help prevent falls. What can I do on the outside of my home?  Regularly fix the edges of walkways and driveways and fix any cracks.  Remove anything that might make you trip as you walk through a door, such as a raised step or threshold.  Trim any bushes or trees on the path to your home.  Use bright outdoor lighting.  Clear any walking paths of anything that might make someone trip, such as rocks or tools.  Regularly check to see if handrails are loose or broken. Make sure that both sides of any steps have handrails.  Any raised decks and porches should have guardrails on the edges.  Have any leaves, snow, or ice cleared regularly.  Use sand or salt on walking paths during winter.  Clean up any spills in your garage right away. This includes oil or grease spills. What can I do in the bathroom?  Use night lights.  Install grab bars by the toilet and in the tub and shower. Do  not use towel bars as grab bars.  Use non-skid mats or decals in the tub or shower.  If you need to sit down in the shower, use a plastic, non-slip stool.  Keep the floor dry. Clean up any water that spills on the floor as soon as it happens.  Remove soap buildup in the tub or shower regularly.  Attach bath mats securely with double-sided non-slip rug tape.  Do not have throw rugs and other things on the floor that can make you trip. What can I do in the bedroom?  Use night lights.  Make sure that you have a light by your bed that is easy to reach.  Do not use any sheets or blankets that are too big for your bed. They should not hang down onto the floor.  Have a firm chair that has side arms. You can use this for support while you get dressed.  Do not have throw rugs and other things on the floor that can make you trip. What can I do in the kitchen?  Clean up any spills right away.  Avoid walking on wet floors.  Keep items that you use a lot in easy-to-reach places.  If you need to reach something above you, use a strong step  stool that has a grab bar.  Keep electrical cords out of the way.  Do not use floor polish or wax that makes floors slippery. If you must use wax, use non-skid floor wax.  Do not have throw rugs and other things on the floor that can make you trip. What can I do with my stairs?  Do not leave any items on the stairs.  Make sure that there are handrails on both sides of the stairs and use them. Fix handrails that are broken or loose. Make sure that handrails are as long as the stairways.  Check any carpeting to make sure that it is firmly attached to the stairs. Fix any carpet that is loose or worn.  Avoid having throw rugs at the top or bottom of the stairs. If you do have throw rugs, attach them to the floor with carpet tape.  Make sure that you have a light switch at the top of the stairs and the bottom of the stairs. If you do not have them,  ask someone to add them for you. What else can I do to help prevent falls?  Wear shoes that:  Do not have high heels.  Have rubber bottoms.  Are comfortable and fit you well.  Are closed at the toe. Do not wear sandals.  If you use a stepladder:  Make sure that it is fully opened. Do not climb a closed stepladder.  Make sure that both sides of the stepladder are locked into place.  Ask someone to hold it for you, if possible.  Clearly mark and make sure that you can see:  Any grab bars or handrails.  First and last steps.  Where the edge of each step is.  Use tools that help you move around (mobility aids) if they are needed. These include:  Canes.  Walkers.  Scooters.  Crutches.  Turn on the lights when you go into a dark area. Replace any light bulbs as soon as they burn out.  Set up your furniture so you have a clear path. Avoid moving your furniture around.  If any of your floors are uneven, fix them.  If there are any pets around you, be aware of where they are.  Review your medicines with your doctor. Some medicines can make you feel dizzy. This can increase your chance of falling. Ask your doctor what other things that you can do to help prevent falls. This information is not intended to replace advice given to you by your health care provider. Make sure you discuss any questions you have with your health care provider. Document Released: 11/06/2008 Document Revised: 06/18/2015 Document Reviewed: 02/14/2014 Elsevier Interactive Patient Education  2017 [employer].

## 2020-04-13 ENCOUNTER — Telehealth: Payer: Self-pay

## 2020-04-13 NOTE — Telephone Encounter (Signed)
Copied from CRM [id number]. Topic: General - Other >> Apr 13, 2020 11:01 AM Gwenlyn Fudge wrote: Reason for CRM: Pt calling and is requesting to have a callback to have her AWV rescheduled. She states that she was told this was supposed to have a call back from someone already, but has not heard anything. Please advise.

## 2020-04-14 NOTE — Telephone Encounter (Signed)
Pt was trying to schedule her CPE. AWV completed 03/17/20. CPE already rescheduled with Dr Beryle Flock for 08/10/20.

## 2020-05-20 ENCOUNTER — Other Ambulatory Visit: Payer: Self-pay | Admitting: Family Medicine

## 2020-05-20 DIAGNOSIS — I10 Essential (primary) hypertension: Secondary | ICD-10-CM

## 2020-05-20 NOTE — Telephone Encounter (Signed)
Patient has appointment scheduled 08/10/20- patient has not had 6 month follow up- overdue appointments and labs- RF because patient has appointment- may need to discuss need for 6 month follow up with patient

## 2020-08-10 ENCOUNTER — Ambulatory Visit (INDEPENDENT_AMBULATORY_CARE_PROVIDER_SITE_OTHER): Payer: Medicare Other | Admitting: Family Medicine

## 2020-08-10 ENCOUNTER — Encounter: Payer: Self-pay | Admitting: Family Medicine

## 2020-08-10 ENCOUNTER — Other Ambulatory Visit: Payer: Self-pay

## 2020-08-10 VITALS — BP 133/60 | HR 83 | Temp 98.0°F | Resp 16 | Ht 62.0 in | Wt 138.0 lb

## 2020-08-10 DIAGNOSIS — R739 Hyperglycemia, unspecified: Secondary | ICD-10-CM

## 2020-08-10 DIAGNOSIS — Z0000 Encounter for general adult medical examination without abnormal findings: Secondary | ICD-10-CM

## 2020-08-10 DIAGNOSIS — E7800 Pure hypercholesterolemia, unspecified: Secondary | ICD-10-CM | POA: Diagnosis not present

## 2020-08-10 DIAGNOSIS — H6981 Other specified disorders of Eustachian tube, right ear: Secondary | ICD-10-CM | POA: Diagnosis not present

## 2020-08-10 DIAGNOSIS — D692 Other nonthrombocytopenic purpura: Secondary | ICD-10-CM | POA: Diagnosis not present

## 2020-08-10 DIAGNOSIS — N1831 Chronic kidney disease, stage 3a: Secondary | ICD-10-CM | POA: Diagnosis not present

## 2020-08-10 DIAGNOSIS — Z780 Asymptomatic menopausal state: Secondary | ICD-10-CM | POA: Diagnosis not present

## 2020-08-10 DIAGNOSIS — E559 Vitamin D deficiency, unspecified: Secondary | ICD-10-CM | POA: Diagnosis not present

## 2020-08-10 DIAGNOSIS — H6991 Unspecified Eustachian tube disorder, right ear: Secondary | ICD-10-CM | POA: Insufficient documentation

## 2020-08-10 DIAGNOSIS — I10 Essential (primary) hypertension: Secondary | ICD-10-CM | POA: Diagnosis not present

## 2020-08-10 MED ORDER — FLUTICASONE PROPIONATE 50 MCG/ACT NA SUSP: 2.0000 | Freq: Every day | NASAL | 6 refills | Status: AC

## 2020-08-10 NOTE — Progress Notes (Signed)
Annual Wellness Visit     Patient: Gina [name], Female    DOB: 1940-04-10, 80 y.o.   MRN: [id number] Visit Date: 08/10/2020  Today's Provider: Shirlee Latch, MD   Chief Complaint  Patient presents with   Annual Exam   Subjective    Gina [name] is a 80 y.o. female who presents today for her CPE. She reports consuming a general diet. The patient does not participate in regular exercise at present. She generally feels well. She reports sleeping well. She does not have additional problems to discuss today.   HPI   Seasonal Allergies The patient states that she states has been experiencing seasonal allergies.   Eustachian Tube Dysfunction  She states that she is experiencing a congestion located in her right ear\. She states that this congestion causes her to have trouble hearing which mainly affects her during church service because she is unable to hear the preacher.     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   biopsy   CESAREAN SECTION     HERNIA REPAIR Left 2014   Laparoscopic placement  10 x 15 cm Physiomesh at lateral abdominal wall port site hernia   HIP SURGERY Left 2013   OOPHORECTOMY  2013   Social History   Socioeconomic History   Marital status: Widowed    Spouse name: Not on file   Number of children: 1   Years of education: Not on file   Highest education level: 12th grade  Occupational History   Occupation: retired  Tobacco Use   Smoking status: Former    Packs/day: 0.50    Years: 5.00    Pack years: 2.50    Types: Cigarettes    Quit date: 01/23/1970    Years since quitting: 50.5   Smokeless tobacco: Never  Vaping Use   Vaping Use: Never used  Substance and Sexual Activity   Alcohol use: Yes    Comment: rare - 1 drink   Drug use: No   Sexual activity: Not Currently  Other Topics Concern   Not on file  Social History Narrative   Not on file   Social Determinants of Health   Financial Resource Strain: Low Risk    Difficulty of Paying Living Expenses: Not hard at all  Food Insecurity: No Food Insecurity   Worried About Running Out of Food in the Last Year: Never true   Ran  Out of Food in the Last Year: Never true  Transportation Needs: No Transportation Needs   Lack of Transportation (Medical): No   Lack of Transportation (Non-Medical): No  Physical Activity: Inactive   Days of Exercise per Week: 0 days   Minutes of Exercise per Session: 0 min  Stress: Stress Concern Present   Feeling of Stress : To some extent  Social Connections: Moderately Isolated   Frequency of Communication with Friends and Family: More than three times a week   Frequency of Social Gatherings with Friends and Family: More than three times a week   Attends Religious Services: More than 4 times per year   Active Member of [employer] or Organizations: No   Attends Tax inspector Meetings: Never   Marital Status: Widowed  Catering manager Violence: Not At Risk   Fear of Current or Ex-Partner: No   Emotionally Abused: No   Physically Abused: No   Sexually Abused: No   Family History  Problem Relation Age of Onset   Congestive Heart Failure Mother 2       Glucose intolerance   Heart disease Father 82       Valvular   Rheumatic fever Father    Heart failure Brother 77       heart valve   Rheumatic fever Sister    Allergies  Allergen Reactions   Hydrochlorothiazide    Chlorthalidone Nausea Only   Lisinopril Cough   Penicillins Rash       Medications: Outpatient Medications Prior to Visit  Medication Sig   acetaminophen (TYLENOL) 325 MG tablet Take by mouth every 6 (six) hours as needed for pain.   amLODipine (NORVASC) 5 MG tablet Take 1 tablet by mouth once daily   aspirin 325 MG tablet Take 325 mg by mouth daily.   atorvastatin (LIPITOR) 40 MG tablet Take 1 tablet (40 mg total) by mouth daily. (Patient not taking: Reported on 03/17/2020)   cetirizine (ZYRTEC) 10 MG tablet Take 10 mg by mouth daily. Reported on 08/10/2015   cholecalciferol (VITAMIN D) 1000 units tablet Take 2,000 Units by mouth daily.    losartan (COZAAR) 100 MG tablet Take 1 tablet by mouth once daily   metoprolol tartrate (LOPRESSOR) 100 MG tablet Take 1 tablet by mouth twice daily   No facility-administered medications prior to visit.    Allergies  Allergen Reactions   Hydrochlorothiazide    Chlorthalidone Nausea Only   Lisinopril Cough   Penicillins Rash    Patient Care Team: Erasmo Downer, MD as PCP - General (Family Medicine) Lady Gary Darlin Priestly, MD as Consulting Physician (Cardiology) Pa, [facility] (Optometry)  Review of Systems  Constitutional:  Negative for chills, fatigue and fever.  HENT:  Negative for congestion, ear pain, rhinorrhea, sinus pain and sore throat.   Respiratory:  Negative for cough, shortness of breath and wheezing.    Cardiovascular:  Negative for chest pain and leg swelling.  Gastrointestinal:  Negative for abdominal pain, blood in stool, diarrhea, nausea and vomiting.  Genitourinary:  Negative for dysuria, flank pain, frequency and urgency.  Neurological:  Negative for dizziness and headaches.  All other systems reviewed and are negative.  Last CBC Lab Results  Component Value Date   WBC 7.0 11/08/2017   HGB 13.7 11/08/2017   HCT 40.5 11/08/2017   MCV 88 11/08/2017   MCH 29.8 11/08/2017   RDW 12.8 11/08/2017   PLT 179 11/08/2017   Last metabolic panel Lab Results  Component Value  Date   GLUCOSE 109 (H) 08/27/2019   NA 141 08/27/2019   K 4.5 08/27/2019   CL 105 08/27/2019   CO2 20 08/27/2019   BUN 21 08/27/2019   CREATININE 1.21 (H) 08/27/2019   GFRNONAA 43 (L) 08/27/2019   GFRAA 49 (L) 08/27/2019   CALCIUM 10.2 08/27/2019   PROT 7.4 07/16/2019   ALBUMIN 3.8 07/16/2019   LABGLOB 3.6 07/16/2019   AGRATIO 1.1 (L) 07/16/2019   BILITOT 0.4 07/16/2019   ALKPHOS 70 07/16/2019   AST 16 07/16/2019   ALT 19 07/16/2019   ANIONGAP 5 (L) 05/30/2012   Last lipids Lab Results  Component Value Date   CHOL 184 07/16/2019   HDL 35 (L) 07/16/2019   LDLCALC 119 (H) 07/16/2019   TRIG 169 (H) 07/16/2019   CHOLHDL 5.3 (H) 07/16/2019   Last hemoglobin A1c Lab Results  Component Value Date   HGBA1C 5.9 10/19/2012   Last thyroid functions Lab Results  Component Value Date   TSH 0.654 09/04/2014   Last vitamin D Lab Results  Component Value Date   VD25OH 30.5 11/08/2017   Last vitamin B12 and Folate No results found for: VITAMINB12, FOLATE      Objective    Vitals: BP 133/60 (BP Location: Right Arm, Patient Position: Sitting, Cuff Size: Large)   Pulse 83   Temp 98 F (36.7 C) (Oral)   Resp 16   Ht 5\' 2"  (1.575 m)   Wt 138 lb (62.6 kg)   SpO2 96%   BMI 25.24 kg/m  BP Readings from Last 3 Encounters:  08/10/20 133/60  07/03/19 (!) 148/72  06/22/18 132/66   Wt Readings  from Last 3 Encounters:  08/10/20 138 lb (62.6 kg)  07/03/19 131 lb (59.4 kg)  03/28/18 140 lb 6.4 oz (63.7 kg)      Physical Exam Vitals reviewed.  Constitutional:      General: She is not in acute distress.    Appearance: Normal appearance. She is well-developed. She is not diaphoretic.  HENT:     Head: Normocephalic and atraumatic.     Right Ear: Tympanic membrane, ear canal and external ear normal.     Left Ear: Tympanic membrane, ear canal and external ear normal.  Eyes:     General: No scleral icterus.    Conjunctiva/sclera: Conjunctivae normal.     Pupils: Pupils are equal, round, and reactive to light.  Neck:     Thyroid: No thyromegaly.  Cardiovascular:     Rate and Rhythm: Normal rate and regular rhythm.     Pulses: Normal pulses.     Heart sounds: Normal heart sounds. No murmur heard. Pulmonary:     Effort: Pulmonary effort is normal. No respiratory distress.     Breath sounds: Normal breath sounds. No wheezing or rales.  Abdominal:     General: There is no distension.     Palpations: Abdomen is soft.     Tenderness: There is no abdominal tenderness.  Musculoskeletal:        General: No deformity.     Cervical back: Neck supple.     Right lower leg: No edema.     Left lower leg: No edema.  Lymphadenopathy:     Cervical: No cervical adenopathy.  Skin:    General: Skin is warm and dry.     Findings: No rash.  Neurological:     Mental Status: She is alert and oriented to person, place, and time. Mental status is at baseline.  Sensory: No sensory deficit.     Motor: No weakness.     Gait: Gait normal.  Psychiatric:        Mood and Affect: Mood normal.        Behavior: Behavior normal.        Thought Content: Thought content normal.     Most recent functional status assessment: In your present state of health, do you have any difficulty performing the following activities: 08/10/2020  Hearing? Y  Comment -  Vision? N  Difficulty concentrating or  making decisions? N  Walking or climbing stairs? Y  Dressing or bathing? N  Doing errands, shopping? Y  Preparing Food and eating ? -  Using the Toilet? -  In the past six months, have you accidently leaked urine? -  Comment -  Do you have problems with loss of bowel control? -  Managing your Medications? -  Managing your Finances? -  Housekeeping or managing your Housekeeping? -  Some recent data might be hidden   Most recent fall risk assessment: Fall Risk  08/10/2020  Falls in the past year? 0  Number falls in past yr: 0  Injury with Fall? 0  Comment -  Risk for fall due to : Impaired mobility  Follow up Falls evaluation completed    Most recent depression screenings: PHQ 2/9 Scores 08/10/2020 03/17/2020  PHQ - 2 Score 1 0  PHQ- 9 Score 2 -   Most recent cognitive screening: 6CIT Screen 08/10/2020  What Year? 0 points  What month? 0 points  What time? 0 points  Count back from 20 0 points  Months in reverse 0 points  Repeat phrase 0 points  Total Score 0   Most recent Audit-C alcohol use screening Alcohol Use Disorder Test (AUDIT) 08/10/2020  1. How often do you have a drink containing alcohol? 0  2. How many drinks containing alcohol do you have on a typical day when you are drinking? 0  3. How often do you have six or more drinks on one occasion? 0  AUDIT-C Score 0   A score of 3 or more in women, and 4 or more in men indicates increased risk for alcohol abuse, EXCEPT if all of the points are from question 1   No results found for any visits on 08/10/20.  Assessment & Plan     Annual wellness visit done today including the all of the following: Reviewed patient's Family Medical History Reviewed and updated list of patient's medical providers Assessment of cognitive impairment was done Assessed patient's functional ability Established a written schedule for health screening services Health Risk Assessent Completed and Reviewed  Exercise Activities and Dietary  recommendations  Goals       DIET - INCREASE WATER INTAKE      Recommend increasing water intake to 6-8 8 oz glasses a day.       I really don't know anything about cholesterol or where it comes from (pt-stated)      Current Barriers:  Knowledge Deficits related to understanding hyperlipidemia  Nurse Case Manager Clinical Goal(s):  Over the next 14 days, patient will demonstrate improved adherence to prescribed treatment plan for elevated cholesterol  as evidenced byadherence to statin therapy, daily exercise, and low cholesterol diet  Interventions:  Evaluation of current treatment plan related to dyslipidemia and patient's adherence to plan as established by provider. Provided education to patient re: dislipidemia and low cholesterol diet Reviewed medications with patient and discussed importance of compliance  with statin therapy and importance of discussing potential side effects with provider Discussed plans with patient for ongoing care management follow up and provided patient with direct contact information for care management team Advised patient, providing education and rationale, to monitor blood pressure daily and record Provided patient with written educational materials related to dyslipidemia diet and self care management  Patient Self Care Activities:  Self administers medications as prescribed Attends all scheduled provider appointments Calls pharmacy for medication refills Attends church or other social activities Performs ADL's independently Performs IADL's independently Calls provider office for new concerns or questions  Initial goal documentation       LIFESTYLE - DECREASE FALLS RISK      Recommend to remove any items from the home that may cause slips or trips.         There is no immunization history on file for this patient.  Health Maintenance  Topic Date Due   COVID-19 Vaccine (1) Never done   Zoster Vaccines- Shingrix (1 of 2) Never done    DEXA SCAN  Never done   PNA vac Low Risk Adult (1 of 2 - PCV13) 03/17/2021 (Originally 01/26/2005)   TETANUS/TDAP  10/31/2022 (Originally 01/27/1959)   INFLUENZA VACCINE  08/24/2020   HPV VACCINES  Aged Out    Declines vaccines and breast cancer screening   Discussed health benefits of physical activity, and encouraged her to engage in regular exercise appropriate for her age and condition.    Problem List Items Addressed This Visit       Cardiovascular and Mediastinum   Hypertension, essential    Well controlled Continue current medications Recheck metabolic panel F/u in 6 months        Relevant Orders   Comprehensive metabolic panel   Senile purpura (HCC)    Chronic and stable         Nervous and Auditory   Dysfunction of right eustachian tube    New problem Trial of flonase Some cerumen also - try debrox         Genitourinary   CKD (chronic kidney disease) stage 3, GFR 30-59 ml/min (HCC)    Chronic and stable Recheck metabolic panel Avoid nephrotoxic meds       Relevant Orders   Comprehensive metabolic panel     Other   Hypercholesteremia    Encourage statin use Recheck mp and flp H/o cva       Relevant Orders   Comprehensive metabolic panel   Lipid panel   Avitaminosis D   Other Visit Diagnoses     Encounter for annual physical exam    -  Primary   Hyperglycemia       Relevant Orders   Hemoglobin A1c   Postmenopausal       Relevant Orders   DG Bone Density        Return in about 6 months (around 02/10/2021) for chronic disease f/u.     Danelle EarthlyI,Rebekah Moorehead,acting as a Neurosurgeonscribe for Shirlee LatchAngela Jessicaann Overbaugh, MD.,have documented all relevant documentation on the behalf of Shirlee LatchAngela Burnetta Kohls, MD,as directed by  Shirlee LatchAngela Alaijah Gibler, MD while in the presence of Shirlee LatchAngela Mylin Gignac, MD.  I, Shirlee LatchAngela Ilee Randleman, MD, have reviewed all documentation for this visit. The documentation on 08/10/20 for the exam, diagnosis, procedures, and orders are all accurate  and complete.   Labrina Lines, Marzella SchleinAngela M, MD, MPH [facility]

## 2020-08-10 NOTE — Assessment & Plan Note (Signed)
Well controlled Continue current medications Recheck metabolic panel F/u in 6 months  

## 2020-08-10 NOTE — Assessment & Plan Note (Signed)
Chronic and stable Recheck metabolic panel Avoid nephrotoxic meds  

## 2020-08-10 NOTE — Assessment & Plan Note (Signed)
Encourage statin use Recheck mp and flp H/o cva

## 2020-08-10 NOTE — Patient Instructions (Signed)
Try flonase for ear popping Try Debrox drops (OTC) for wax

## 2020-08-10 NOTE — Assessment & Plan Note (Signed)
Chronic and stable.   

## 2020-08-10 NOTE — Assessment & Plan Note (Signed)
New problem Trial of flonase Some cerumen also - try debrox

## 2020-08-11 LAB — COMPREHENSIVE METABOLIC PANEL
ALT: 12 IU/L (ref 0–32)
AST: 14 IU/L (ref 0–40)
Albumin/Globulin Ratio: 1.2 (ref 1.2–2.2)
Albumin: 4.1 g/dL (ref 3.7–4.7)
Alkaline Phosphatase: 88 IU/L (ref 44–121)
BUN/Creatinine Ratio: 20 (ref 12–28)
BUN: 25 mg/dL (ref 8–27)
Bilirubin Total: 0.4 mg/dL (ref 0.0–1.2)
CO2: 21 mmol/L (ref 20–29)
Calcium: 9.9 mg/dL (ref 8.7–10.3)
Chloride: 106 mmol/L (ref 96–106)
Creatinine, Ser: 1.25 mg/dL — ABNORMAL HIGH (ref 0.57–1.00)
Globulin, Total: 3.4 g/dL (ref 1.5–4.5)
Glucose: 88 mg/dL (ref 65–99)
Potassium: 4.6 mmol/L (ref 3.5–5.2)
Sodium: 141 mmol/L (ref 134–144)
Total Protein: 7.5 g/dL (ref 6.0–8.5)
eGFR: 44 mL/min/{1.73_m2} — ABNORMAL LOW (ref >59)

## 2020-08-11 LAB — LIPID PANEL
Chol/HDL Ratio: 5.4 ratio — ABNORMAL HIGH (ref 0.0–4.4)
Cholesterol, Total: 189 mg/dL (ref 100–199)
HDL: 35 mg/dL — ABNORMAL LOW (ref >39)
LDL Chol Calc (NIH): 120 mg/dL — ABNORMAL HIGH (ref 0–99)
Triglycerides: 189 mg/dL — ABNORMAL HIGH (ref 0–149)
VLDL Cholesterol Cal: 34 mg/dL (ref 5–40)

## 2020-08-11 LAB — HEMOGLOBIN A1C
Est. average glucose Bld gHb Est-mCnc: 128 mg/dL
Hgb A1c MFr Bld: 6.1 % — ABNORMAL HIGH (ref 4.8–5.6)

## 2020-09-03 ENCOUNTER — Other Ambulatory Visit: Payer: Self-pay | Admitting: Family Medicine

## 2020-09-03 DIAGNOSIS — I10 Essential (primary) hypertension: Secondary | ICD-10-CM

## 2021-01-02 ENCOUNTER — Other Ambulatory Visit: Payer: Self-pay | Admitting: Family Medicine

## 2021-01-02 NOTE — Telephone Encounter (Signed)
med was refilled today.   01/02/21

## 2021-01-02 NOTE — Telephone Encounter (Signed)
Requested Prescriptions  Pending Prescriptions Disp Refills  . losartan (COZAAR) 100 MG tablet [Pharmacy Med Name: Losartan Potassium 100 MG Oral Tablet] 90 tablet 0    Sig: Take 1 tablet by mouth once daily     Cardiovascular:  Angiotensin Receptor Blockers Failed - 01/02/2021 10:06 AM      Failed - Cr in normal range and within 180 days    Creat  Date Value Ref Range Status  11/04/2016 1.07 (H) 0.60 - 0.93 mg/dL Final    Comment:    For patients >50 years of age, the reference limit for Creatinine is approximately 13% higher for people identified as African-American. .    Creatinine, Ser  Date Value Ref Range Status  08/10/2020 1.25 (H) 0.57 - 1.00 mg/dL Final         Passed - K in normal range and within 180 days    Potassium  Date Value Ref Range Status  08/10/2020 4.6 3.5 - 5.2 mmol/L Final  05/30/2012 4.2 3.5 - 5.1 mmol/L Final         Passed - Patient is not pregnant      Passed - Last BP in normal range    BP Readings from Last 1 Encounters:  08/10/20 133/60         Passed - Valid encounter within last 6 months    Recent Outpatient Visits          4 months ago Encounter for annual physical exam   [facility] [place], Marzella Schlein, MD   1 year ago Hypertension, essential   [facility] [place], Marzella Schlein, MD   2 years ago Hypertension, essential   [facility] [place], Marzella Schlein, MD   2 years ago Hypertension, essential   [facility] [place], Marzella Schlein, MD   2 years ago Hypertension, essential   [facility] [place], Marzella Schlein, MD      Future Appointments            In 1 month [place], Marzella Schlein, MD [facility]

## 2021-01-05 ENCOUNTER — Other Ambulatory Visit: Payer: Self-pay | Admitting: Family Medicine

## 2021-01-05 DIAGNOSIS — I10 Essential (primary) hypertension: Secondary | ICD-10-CM

## 2021-02-09 NOTE — Progress Notes (Signed)
Established patient visit   Patient: Gina [name]   DOB: Mar 18, 1940   81 y.o. Female  MRN: [id number] Visit Date: 02/11/2021  Today's healthcare provider: Lavon Paganini, MD   Chief Complaint  Patient presents with   Hypertension   Subjective    HPI  Hypertension, follow-up  BP Readings from Last 3 Encounters:  02/11/21 122/75  08/10/20 133/60  07/03/19 (!) 148/72   Wt Readings from Last 3 Encounters:  02/11/21 138 lb (62.6 kg)  08/10/20 138 lb (62.6 kg)  07/03/19 131 lb (59.4 kg)     She was last seen for hypertension 6 months ago.  BP at that visit was 133/60. Management since that visit includes no changes. She reports excellent compliance with treatment. She is not having side effects.  She is not exercising. She is adherent to low salt diet.   Outside blood pressures are .  She does not smoke.  Use of agents associated with hypertension: none.   --------------------------------------------------------------------------------------------------- Lipid/Cholesterol, follow-up  Last Lipid Panel: Lab Results  Component Value Date   CHOL 189 08/10/2020   LDLCALC 120 (H) 08/10/2020   HDL 35 (L) 08/10/2020   TRIG 189 (H) 08/10/2020    She was last seen for this 6 months ago.  Management since that visit includes no changes.  She reports excellent compliance with treatment. She is not having side effects.   Symptoms: No appetite changes No foot ulcerations  No chest pain No chest pressure/discomfort  No dyspnea No orthopnea  No fatigue No lower extremity edema  No palpitations No paroxysmal nocturnal dyspnea  No nausea No numbness or tingling of extremity  No polydipsia No polyuria  No speech difficulty No syncope   She is following a Regular diet. Current exercise: walking  Last metabolic panel Lab Results  Component Value Date   GLUCOSE 88 08/10/2020   NA 141 08/10/2020   K 4.6 08/10/2020   BUN 25 08/10/2020   CREATININE 1.25 (H)  08/10/2020   EGFR 44 (L) 08/10/2020   GFRNONAA 43 (L) 08/27/2019   CALCIUM 9.9 08/10/2020   AST 14 08/10/2020   ALT 12 08/10/2020   The ASCVD Risk score (Arnett DK, et al., 2019) failed to calculate for the following reasons:   The 2019 ASCVD risk score is only valid for ages 45 to 20  --------------------------------------------------------------------------------------------------- Prediabetes, Follow-up  Lab Results  Component Value Date   HGBA1C 5.6 02/11/2021   HGBA1C 6.1 (H) 08/10/2020   HGBA1C 5.9 10/19/2012   GLUCOSE 88 08/10/2020   GLUCOSE 109 (H) 08/27/2019   GLUCOSE 104 (H) 07/16/2019    Last seen for for this6 months ago.  Management since that visit includes no changes.   Pertinent Labs:    Component Value Date/Time   CHOL 189 08/10/2020 1608   TRIG 189 (H) 08/10/2020 1608   CHOLHDL 5.4 (H) 08/10/2020 1608   CHOLHDL 5.4 (H) 11/04/2016 1628   CREATININE 1.25 (H) 08/10/2020 1608   CREATININE 1.07 (H) 11/04/2016 1628    Wt Readings from Last 3 Encounters:  02/11/21 138 lb (62.6 kg)  08/10/20 138 lb (62.6 kg)  07/03/19 131 lb (59.4 kg)    -----------------------------------------------------------------------------------------   Medications: Outpatient Medications Prior to Visit  Medication Sig   acetaminophen (TYLENOL) 325 MG tablet Take by mouth every 6 (six) hours as needed for pain.   amLODipine (NORVASC) 5 MG tablet Take 1 tablet by mouth once daily   aspirin 325 MG tablet Take 325 mg  by mouth daily.   cetirizine (ZYRTEC) 10 MG tablet Take 10 mg by mouth daily. Reported on 08/10/2015   cholecalciferol (VITAMIN D) 1000 units tablet Take 2,000 Units by mouth daily.    fluticasone (FLONASE) 50 MCG/ACT nasal spray Place 2 sprays into both nostrils daily.   losartan (COZAAR) 100 MG tablet Take 1 tablet by mouth once daily   metoprolol tartrate (LOPRESSOR) 100 MG tablet Take 1 tablet by mouth twice daily   [DISCONTINUED] atorvastatin (LIPITOR) 40 MG  tablet Take 1 tablet (40 mg total) by mouth daily.   No facility-administered medications prior to visit.    Review of Systems Per HPI     Objective    BP 122/75 (BP Location: Right Arm, Patient Position: Sitting, Cuff Size: Normal)    Ht 5' 4"  (1.626 m)    Wt 138 lb (62.6 kg)    SpO2 98%    BMI 23.69 kg/m  {Show previous vital signs (optional):23777}  Physical Exam Vitals reviewed.  Constitutional:      General: She is not in acute distress.    Appearance: Normal appearance. She is well-developed. She is not diaphoretic.  HENT:     Head: Normocephalic and atraumatic.  Eyes:     General: No scleral icterus.    Conjunctiva/sclera: Conjunctivae normal.  Neck:     Thyroid: No thyromegaly.  Cardiovascular:     Rate and Rhythm: Normal rate and regular rhythm.     Pulses: Normal pulses.     Heart sounds: Normal heart sounds. No murmur heard. Pulmonary:     Effort: Pulmonary effort is normal. No respiratory distress.     Breath sounds: Normal breath sounds. No wheezing, rhonchi or rales.  Musculoskeletal:     Cervical back: Neck supple.     Right lower leg: No edema.     Left lower leg: No edema.  Lymphadenopathy:     Cervical: No cervical adenopathy.  Skin:    General: Skin is warm and dry.     Findings: No rash.  Neurological:     Mental Status: She is alert and oriented to person, place, and time. Mental status is at baseline.     Gait: Gait abnormal (L sided weakness).  Psychiatric:        Mood and Affect: Mood normal.        Behavior: Behavior normal.      Results for orders placed or performed in visit on 02/11/21  POCT glycosylated hemoglobin (Hb A1C)  Result Value Ref Range   Hemoglobin A1C 5.6 4.0 - 5.6 %    Assessment & Plan     Problem List Items Addressed This Visit       Cardiovascular and Mediastinum   Hypertension, essential - Primary    Well controlled Continue current medications Recheck metabolic panel F/u in 6 months       Relevant  Medications   rosuvastatin (CRESTOR) 5 MG tablet   Other Relevant Orders   Comprehensive metabolic panel   Senile purpura (HCC)    Chronic and stable Continue to monitor      Relevant Medications   rosuvastatin (CRESTOR) 5 MG tablet     Respiratory   Allergic rhinitis    Chronic Worsening postnasal drip Resume antihistamine and Flonase        Other   History of CVA (cerebrovascular accident)    Discussed importance of secondary prevention Start Crestor 5 mg daily and lieu of atorvastatin which she has concerns about Consider Rollator in  the future given her ongoing abnormal gait      Hypercholesteremia    History of CVA Again encourage statin use Patient has heard a lot of bad things about Lipitor and thinks this prevents her from taking it, but is willing to try Crestor Start Crestor 5 mg daily Recheck CMP and FLP      Relevant Medications   rosuvastatin (CRESTOR) 5 MG tablet   Other Relevant Orders   Comprehensive metabolic panel   Lipid panel   Abnormal gait    Related to deficits after CVA Consider Rollator      Other Visit Diagnoses     Hyperglycemia       Relevant Orders   POCT glycosylated hemoglobin (Hb A1C) (Completed)     Well-controlled with normal A1c today   Return in about 6 months (around 08/11/2021) for CPE.      I, Lavon Paganini, MD, have reviewed all documentation for this visit. The documentation on 02/11/21 for the exam, diagnosis, procedures, and orders are all accurate and complete.   Milayah Krell, Dionne Bucy, MD, MPH [facility]

## 2021-02-11 ENCOUNTER — Encounter: Payer: Self-pay | Admitting: Family Medicine

## 2021-02-11 ENCOUNTER — Ambulatory Visit (INDEPENDENT_AMBULATORY_CARE_PROVIDER_SITE_OTHER): Payer: Medicare Other | Admitting: Family Medicine

## 2021-02-11 ENCOUNTER — Other Ambulatory Visit: Payer: Self-pay

## 2021-02-11 VITALS — BP 122/75 | Ht 64.0 in | Wt 138.0 lb

## 2021-02-11 DIAGNOSIS — I10 Essential (primary) hypertension: Secondary | ICD-10-CM

## 2021-02-11 DIAGNOSIS — J309 Allergic rhinitis, unspecified: Secondary | ICD-10-CM

## 2021-02-11 DIAGNOSIS — E7800 Pure hypercholesterolemia, unspecified: Secondary | ICD-10-CM | POA: Diagnosis not present

## 2021-02-11 DIAGNOSIS — R269 Unspecified abnormalities of gait and mobility: Secondary | ICD-10-CM | POA: Diagnosis not present

## 2021-02-11 DIAGNOSIS — D692 Other nonthrombocytopenic purpura: Secondary | ICD-10-CM | POA: Diagnosis not present

## 2021-02-11 DIAGNOSIS — R739 Hyperglycemia, unspecified: Secondary | ICD-10-CM | POA: Diagnosis not present

## 2021-02-11 DIAGNOSIS — Z8673 Personal history of transient ischemic attack (TIA), and cerebral infarction without residual deficits: Secondary | ICD-10-CM | POA: Diagnosis not present

## 2021-02-11 LAB — POCT GLYCOSYLATED HEMOGLOBIN (HGB A1C): Hemoglobin A1C: 5.6 % (ref 4.0–5.6)

## 2021-02-11 MED ORDER — ROSUVASTATIN CALCIUM 5 MG PO TABS: 5.0000 mg | ORAL_TABLET | Freq: Every day | ORAL | 3 refills | Status: AC

## 2021-02-11 NOTE — Assessment & Plan Note (Signed)
Chronic Worsening postnasal drip Resume antihistamine and Flonase

## 2021-02-11 NOTE — Assessment & Plan Note (Signed)
Related to deficits after CVA Consider Rollator

## 2021-02-11 NOTE — Assessment & Plan Note (Signed)
Well controlled Continue current medications Recheck metabolic panel F/u in 6 months  

## 2021-02-11 NOTE — Assessment & Plan Note (Signed)
Discussed importance of secondary prevention Start Crestor 5 mg daily and lieu of atorvastatin which she has concerns about Consider Rollator in the future given her ongoing abnormal gait

## 2021-02-11 NOTE — Assessment & Plan Note (Signed)
Chronic and stable Continue to monitor 

## 2021-02-11 NOTE — Assessment & Plan Note (Signed)
History of CVA Again encourage statin use Patient has heard a lot of bad things about Lipitor and thinks this prevents her from taking it, but is willing to try Crestor Start Crestor 5 mg daily Recheck CMP and FLP

## 2021-03-23 ENCOUNTER — Telehealth: Payer: Self-pay

## 2021-03-23 NOTE — Telephone Encounter (Signed)
The call "could not be completed as dialed"- I tried both numbers (hers and her son's) w/ a 1+ and without and neither calls would go thru

## 2021-03-29 ENCOUNTER — Other Ambulatory Visit: Payer: Self-pay | Admitting: Family Medicine

## 2021-03-30 NOTE — Telephone Encounter (Signed)
Requested Prescriptions  ?Pending Prescriptions Disp Refills  ?? losartan (COZAAR) 100 MG tablet [Pharmacy Med Name: Losartan Potassium 100 MG Oral Tablet] 90 tablet 1  ?  Sig: Take 1 tablet by mouth once daily  ?  ? Cardiovascular:  Angiotensin Receptor Blockers Failed - 03/29/2021  4:52 AM  ?  ?  Failed - Cr in normal range and within 180 days  ?  Creat  ?Date Value Ref Range Status  ?11/04/2016 1.07 (H) 0.60 - 0.93 mg/dL Final  ?  Comment:  ?  For patients >24 years of age, the reference limit ?for Creatinine is approximately 13% higher for people ?identified as African-American. ?. ?  ? ?Creatinine, Ser  ?Date Value Ref Range Status  ?08/10/2020 1.25 (H) 0.57 - 1.00 mg/dL Final  ?   ?  ?  Failed - K in normal range and within 180 days  ?  Potassium  ?Date Value Ref Range Status  ?08/10/2020 4.6 3.5 - 5.2 mmol/L Final  ?05/30/2012 4.2 3.5 - 5.1 mmol/L Final  ?   ?  ?  Passed - Patient is not pregnant  ?  ?  Passed - Last BP in normal range  ?  BP Readings from Last 1 Encounters:  ?02/11/21 122/75  ?   ?  ?  Passed - Valid encounter within last 6 months  ?  Recent Outpatient Visits   ?      ? 1 month ago Hypertension, essential  ? [facility], Marzella Schlein, MD  ? 7 months ago Encounter for annual physical exam  ? [facility], Marzella Schlein, MD  ? 1 year ago Hypertension, essential  ? [facility], Marzella Schlein, MD  ? 2 years ago Hypertension, essential  ? [facility], Marzella Schlein, MD  ? 3 years ago Hypertension, essential  ? [facility], Marzella Schlein, MD  ?  ?  ?Future Appointments   ?        ? In 4 months Bacigalupo, Marzella Schlein, MD [facility]  ?  ? ?  ?  ?  ? ?

## 2021-04-28 ENCOUNTER — Ambulatory Visit (INDEPENDENT_AMBULATORY_CARE_PROVIDER_SITE_OTHER): Payer: Medicare Other

## 2021-04-28 VITALS — Wt 138.0 lb

## 2021-04-28 DIAGNOSIS — Z0000 Encounter for general adult medical examination without abnormal findings: Secondary | ICD-10-CM

## 2021-04-28 NOTE — Patient Instructions (Signed)
Gina [name] , ?Thank you for taking time to come for your Medicare Wellness Visit. I appreciate your ongoing commitment to your health goals. Please review the following plan we discussed and let me know if I can assist you in the future.  ? ?Screening recommendations/referrals: ?Colonoscopy: aged out ?Mammogram: aged out ?Bone Density: declined referral ?Recommended yearly ophthalmology/optometry visit for glaucoma screening and checkup ?Recommended yearly dental visit for hygiene and checkup ? ?Vaccinations: ?Influenza vaccine: n/c ?Pneumococcal vaccine: n/d ?Tdap vaccine: n/d ?Shingles vaccine: n/d   ?Covid-19:n/d ? ?Advanced directives: no ? ?Conditions/risks identified: none ? ?Next appointment: Follow up in one year for your annual wellness visit  ? ? ?Preventive Care 81 Years and Older, Female ?Preventive care refers to lifestyle choices and visits with your health care provider that can promote health and wellness. ?What does preventive care include? ?A yearly physical exam. This is also called an annual well check. ?Dental exams once or twice a year. ?Routine eye exams. Ask your health care provider how often you should have your eyes checked. ?Personal lifestyle choices, including: ?Daily care of your teeth and gums. ?Regular physical activity. ?Eating a healthy diet. ?Avoiding tobacco and drug use. ?Limiting alcohol use. ?Practicing safe sex. ?Taking low-dose aspirin every day. ?Taking vitamin and mineral supplements as recommended by your health care provider. ?What happens during an annual well check? ?The services and screenings done by your health care provider during your annual well check will depend on your age, overall health, lifestyle risk factors, and family history of disease. ?Counseling  ?Your health care provider may ask you questions about your: ?Alcohol use. ?Tobacco use. ?Drug use. ?Emotional well-being. ?Home and relationship well-being. ?Sexual activity. ?Eating habits. ?History of  falls. ?Memory and ability to understand (cognition). ?Work and work Astronomer. ?Reproductive health. ?Screening  ?You may have the following tests or measurements: ?Height, weight, and BMI. ?Blood pressure. ?Lipid and cholesterol levels. These may be checked every 5 years, or more frequently if you are over 16 years old. ?Skin check. ?Lung cancer screening. You may have this screening every year starting at age 21 if you have a 30-pack-year history of smoking and currently smoke or have quit within the past 15 years. ?Fecal occult blood test (FOBT) of the stool. You may have this test every year starting at age 49. ?Flexible sigmoidoscopy or colonoscopy. You may have a sigmoidoscopy every 5 years or a colonoscopy every 10 years starting at age 43. ?Hepatitis C blood test. ?Hepatitis B blood test. ?Sexually transmitted disease (STD) testing. ?Diabetes screening. This is done by checking your blood sugar (glucose) after you have not eaten for a while (fasting). You may have this done every 1-3 years. ?Bone density scan. This is done to screen for osteoporosis. You may have this done starting at age 25. ?Mammogram. This may be done every 1-2 years. Talk to your health care provider about how often you should have regular mammograms. ?Talk with your health care provider about your test results, treatment options, and if necessary, the need for more tests. ?Vaccines  ?Your health care provider may recommend certain vaccines, such as: ?Influenza vaccine. This is recommended every year. ?Tetanus, diphtheria, and acellular pertussis (Tdap, Td) vaccine. You may need a Td booster every 10 years. ?Zoster vaccine. You may need this after age 13. ?Pneumococcal 13-valent conjugate (PCV13) vaccine. One dose is recommended after age 52. ?Pneumococcal polysaccharide (PPSV23) vaccine. One dose is recommended after age 9. ?Talk to your health care provider about which screenings  and vaccines you need and how often you need  them. ?This information is not intended to replace advice given to you by your health care provider. Make sure you discuss any questions you have with your health care provider. ?Document Released: 02/06/2015 Document Revised: 09/30/2015 Document Reviewed: 11/11/2014 ?Elsevier Interactive Patient Education ? 2017 [employer]. ? ?Fall Prevention in the Home ?Falls can cause injuries. They can happen to people of all ages. There are many things you can do to make your home safe and to help prevent falls. ?What can I do on the outside of my home? ?Regularly fix the edges of walkways and driveways and fix any cracks. ?Remove anything that might make you trip as you walk through a door, such as a raised step or threshold. ?Trim any bushes or trees on the path to your home. ?Use bright outdoor lighting. ?Clear any walking paths of anything that might make someone trip, such as rocks or tools. ?Regularly check to see if handrails are loose or broken. Make sure that both sides of any steps have handrails. ?Any raised decks and porches should have guardrails on the edges. ?Have any leaves, snow, or ice cleared regularly. ?Use sand or salt on walking paths during winter. ?Clean up any spills in your garage right away. This includes oil or grease spills. ?What can I do in the bathroom? ?Use night lights. ?Install grab bars by the toilet and in the tub and shower. Do not use towel bars as grab bars. ?Use non-skid mats or decals in the tub or shower. ?If you need to sit down in the shower, use a plastic, non-slip stool. ?Keep the floor dry. Clean up any water that spills on the floor as soon as it happens. ?Remove soap buildup in the tub or shower regularly. ?Attach bath mats securely with double-sided non-slip rug tape. ?Do not have throw rugs and other things on the floor that can make you trip. ?What can I do in the bedroom? ?Use night lights. ?Make sure that you have a light by your bed that is easy to reach. ?Do not use  any sheets or blankets that are too big for your bed. They should not hang down onto the floor. ?Have a firm chair that has side arms. You can use this for support while you get dressed. ?Do not have throw rugs and other things on the floor that can make you trip. ?What can I do in the kitchen? ?Clean up any spills right away. ?Avoid walking on wet floors. ?Keep items that you use a lot in easy-to-reach places. ?If you need to reach something above you, use a strong step stool that has a grab bar. ?Keep electrical cords out of the way. ?Do not use floor polish or wax that makes floors slippery. If you must use wax, use non-skid floor wax. ?Do not have throw rugs and other things on the floor that can make you trip. ?What can I do with my stairs? ?Do not leave any items on the stairs. ?Make sure that there are handrails on both sides of the stairs and use them. Fix handrails that are broken or loose. Make sure that handrails are as long as the stairways. ?Check any carpeting to make sure that it is firmly attached to the stairs. Fix any carpet that is loose or worn. ?Avoid having throw rugs at the top or bottom of the stairs. If you do have throw rugs, attach them to the floor with carpet tape. ?  Make sure that you have a light switch at the top of the stairs and the bottom of the stairs. If you do not have them, ask someone to add them for you. ?What else can I do to help prevent falls? ?Wear shoes that: ?Do not have high heels. ?Have rubber bottoms. ?Are comfortable and fit you well. ?Are closed at the toe. Do not wear sandals. ?If you use a stepladder: ?Make sure that it is fully opened. Do not climb a closed stepladder. ?Make sure that both sides of the stepladder are locked into place. ?Ask someone to hold it for you, if possible. ?Clearly mark and make sure that you can see: ?Any grab bars or handrails. ?First and last steps. ?Where the edge of each step is. ?Use tools that help you move around (mobility aids)  if they are needed. These include: ?Canes. ?Walkers. ?Scooters. ?Crutches. ?Turn on the lights when you go into a dark area. Replace any light bulbs as soon as they burn out. ?Set up your furniture so you have a clear

## 2021-04-28 NOTE — Progress Notes (Signed)
?Virtual Visit via Telephone Note ? ?I connected with  Gina ApleyMary E [name] on 04/28/21 at  1:00 PM EDT by telephone and verified that I am speaking with the correct person using two identifiers. ? ?Location: ?Patient: home ?Provider: [facility] ?Persons participating in the virtual visit: patient/Nurse Health Advisor ?  ?I discussed the limitations, risks, security and privacy concerns of performing an evaluation and management service by telephone and the availability of in person appointments. The patient expressed understanding and agreed to proceed. ? ?Interactive audio and video telecommunications were attempted between this nurse and patient, however failed, due to patient having technical difficulties OR patient did not have access to video capability.  We continued and completed visit with audio only. ? ?Some vital signs may be absent or patient reported.  ? ?Hal HopeLorrie S Rainie Crenshaw, LPN ? ?Subjective:  ? Gina ApleyMary E [name] is a 81 y.o. female who presents for Medicare Annual (Subsequent) preventive examination. ? ?Review of Systems    ? ?  ? ?   ?Objective:  ?  ?There were no vitals filed for this visit. ?There is no height or weight on file to calculate BMI. ? ? ?  03/17/2020  ?  2:32 PM 07/03/2018  ?  2:40 PM 06/26/2018  ?  9:09 AM 12/12/2017  ?  9:04 AM 06/02/2017  ?  9:29 AM 09/01/2014  ?  9:50 AM  ?Advanced Directives  ?Does Patient Have a Medical Advance Directive? No No No No No No  ?Would patient like information on creating a medical advance directive? No - Patient declined Yes (MAU/Ambulatory/Procedural Areas - Information given) No - Patient declined No - Patient declined Yes (MAU/Ambulatory/Procedural Areas - Information given)   ? ? ?Current Medications (verified) ?Outpatient Encounter Medications as of 04/28/2021  ?Medication Sig  ? metoprolol tartrate (LOPRESSOR) 100 MG tablet Take 1 tablet by mouth 2 (two) times daily.  ? acetaminophen (TYLENOL) 325 MG tablet Take by mouth every 6 (six) hours as needed for pain.  ? amLODipine  (NORVASC) 5 MG tablet Take 1 tablet by mouth once daily  ? aspirin 325 MG tablet Take 325 mg by mouth daily.  ? cetirizine (ZYRTEC) 10 MG tablet Take 10 mg by mouth daily. Reported on 08/10/2015  ? cholecalciferol (VITAMIN D) 1000 units tablet Take 2,000 Units by mouth daily.   ? fluticasone (FLONASE) 50 MCG/ACT nasal spray Place 2 sprays into both nostrils daily.  ? losartan (COZAAR) 100 MG tablet Take 1 tablet by mouth once daily  ? metoprolol tartrate (LOPRESSOR) 100 MG tablet Take 1 tablet by mouth twice daily  ? rosuvastatin (CRESTOR) 5 MG tablet Take 1 tablet (5 mg total) by mouth daily.  ? ?No facility-administered encounter medications on file as of 04/28/2021.  ? ? ?Allergies (verified) ?Hydrochlorothiazide, Chlorthalidone, Lisinopril, and Penicillins  ? ?History: ?Past Medical History:  ?Diagnosis Date  ? Abnormal blood sugar 08/03/2007  ? Hyperlipidemia   ? Hypertension 2010  ? Mini stroke 2010, 2014  ? ?Past Surgical History:  ?Procedure Laterality Date  ? BREAST SURGERY Left 1982?  ? biopsy  ? CESAREAN SECTION    ? HERNIA REPAIR Left 2014  ? Laparoscopic placement  10 x 15 cm Physiomesh at lateral abdominal wall port site hernia  ? HIP SURGERY Left 2013  ? OOPHORECTOMY  2013  ? ?Family History  ?Problem Relation Age of Onset  ? Congestive Heart Failure Mother 6778  ?     Glucose intolerance  ? Heart disease Father 2752  ?  Valvular  ? Rheumatic fever Father   ? Heart failure Brother 77  ?     heart valve  ? Rheumatic fever Sister   ? ?Social History  ? ?Socioeconomic History  ? Marital status: Widowed  ?  Spouse name: Not on file  ? Number of children: 1  ? Years of education: Not on file  ? Highest education level: 12th grade  ?Occupational History  ? Occupation: retired  ?Tobacco Use  ? Smoking status: Former  ?  Packs/day: 0.50  ?  Years: 5.00  ?  Pack years: 2.50  ?  Types: Cigarettes  ?  Quit date: 01/23/1970  ?  Years since quitting: 51.2  ? Smokeless tobacco: Never  ?Vaping Use  ? Vaping Use: Never  used  ?Substance and Sexual Activity  ? Alcohol use: Yes  ?  Comment: rare - 1 drink  ? Drug use: No  ? Sexual activity: Not Currently  ?Other Topics Concern  ? Not on file  ?Social History Narrative  ? Not on file  ? ?Social Determinants of Health  ? ?Financial Resource Strain: Not on file  ?Food Insecurity: Not on file  ?Transportation Needs: Not on file  ?Physical Activity: Not on file  ?Stress: Not on file  ?Social Connections: Not on file  ? ? ?Tobacco Counseling ?Counseling given: Not Answered ? ? ?Clinical Intake: ? ?Pre-visit preparation completed: Yes ? ?Pain : No/denies pain ? ?  ? ?Diabetes: No ? ?How often do you need to have someone help you when you read instructions, pamphlets, or other written materials from your doctor or pharmacy?: 1 - Never ? ?Diabetic?no ? ?Interpreter Needed?: No ? ?Information entered by :: Kennedy Bucker, LPN ? ? ?Activities of Daily Living ? ?  02/11/2021  ?  4:32 PM 08/10/2020  ?  3:33 PM  ?In your present state of health, do you have any difficulty performing the following activities:  ?Hearing? 0 1  ?Vision? 0 0  ?Difficulty concentrating or making decisions? 0 0  ?Walking or climbing stairs? 1 1  ?Dressing or bathing? 1 0  ?Doing errands, shopping? 1 1  ? ? ?Patient Care Team: ?Erasmo Downer, MD as PCP - General (Family Medicine) ?Dalia Heading, MD as Consulting Physician (Cardiology) ?Pa, [facility]) ? ?Indicate any recent Medical Services you may have received from other than [facility] providers in the past year (date may be approximate). ? ?   ?Assessment:  ? This is a routine wellness examination for [facility]. ? ?Hearing/Vision screen ?No results found. ? ?Dietary issues and exercise activities discussed: ?  ? ? Goals Addressed   ?None ?  ? ?Depression Screen ? ?  02/11/2021  ?  4:32 PM 08/10/2020  ?  3:33 PM 03/17/2020  ?  2:28 PM 07/03/2019  ?  4:16 PM 03/12/2018  ?  2:31 PM 01/10/2018  ?  9:06 AM 12/12/2017  ?  9:04 AM  ?PHQ 2/9 Scores  ?PHQ - 2 Score 0 1  0 0 0 0 0  ?PHQ- 9 Score 0 2  0     ?  ?Fall Risk ? ?  02/11/2021  ?  4:28 PM 08/10/2020  ?  3:35 PM 03/17/2020  ?  2:34 PM 07/03/2019  ?  4:17 PM 06/22/2018  ?  8:50 AM  ?Fall Risk   ?Falls in the past year? 0 0 1 0 0  ?Number falls in past yr: 0 0 0 0   ?Injury with  Fall? 0 0 0 0   ?Risk for fall due to : No Fall Risks Impaired mobility     ?Follow up  Falls evaluation completed Falls prevention discussed Falls evaluation completed   ? ? ?FALL RISK PREVENTION PERTAINING TO THE HOME: ? ?Any stairs in or around the home? No  ?If so, are there any without handrails? No  ?Home free of loose throw rugs in walkways, pet beds, electrical cords, etc? Yes  ?Adequate lighting in your home to reduce risk of falls? Yes  ? ?ASSISTIVE DEVICES UTILIZED TO PREVENT FALLS: ? ?Life alert? No  ?Use of a cane, walker or w/c? Yes  ?Grab bars in the bathroom? No  ?Shower chair or bench in shower? Yes  ?Elevated toilet seat or a handicapped toilet? No  ? ?Cognitive Function: ?  ?  ? ?  08/10/2020  ?  3:36 PM  ?6CIT Screen  ?What Year? 0 points  ?What month? 0 points  ?What time? 0 points  ?Count back from 20 0 points  ?Months in reverse 0 points  ?Repeat phrase 0 points  ?Total Score 0 points  ? ? ?Immunizations ? ?There is no immunization history on file for this patient. ? ?TDAP status: Due, Education has been provided regarding the importance of this vaccine. Advised may receive this vaccine at local pharmacy or Health Dept. Aware to provide a copy of the vaccination record if obtained from local pharmacy or Health Dept. Verbalized acceptance and understanding. ? ?Flu Vaccine status: Declined, Education has been provided regarding the importance of this vaccine but patient still declined. Advised may receive this vaccine at local pharmacy or Health Dept. Aware to provide a copy of the vaccination record if obtained from local pharmacy or Health Dept. Verbalized acceptance and understanding. ? ?Pneumococcal vaccine status: Declined,   Education has been provided regarding the importance of this vaccine but patient still declined. Advised may receive this vaccine at local pharmacy or Health Dept. Aware to provide a copy of the vaccination record i

## 2021-05-05 ENCOUNTER — Telehealth: Payer: Self-pay

## 2021-05-05 NOTE — Telephone Encounter (Signed)
-----   Message from Erasmo Downer, MD sent at 04/29/2021  4:57 PM EDT ----- ?She will need a face-to-face visit for new DME order. [facility], can you get her set up for this visit please? ? ?Lorrie, thanks! In the future, you can advise these folks to go ahead and make an appt with their PCP as we have to have this visit and documentation to order the supplies :) ?----- Message ----- ?From: Hal Hope, LPN ?Sent: 04/28/2021   1:15 PM EDT ?To: Erasmo Downer, MD ? ?Hi Dr.B, I saw this lady today for her AWV. She asked if you could send a form to her insurance company that would allow her to get a rollator. (Is this something you do?) Thanks! ? ? ?

## 2021-05-05 NOTE — Telephone Encounter (Signed)
Patient reports she will wait until her next office visit.  ?

## 2021-06-02 ENCOUNTER — Other Ambulatory Visit: Payer: Self-pay | Admitting: Family Medicine

## 2021-06-02 DIAGNOSIS — I10 Essential (primary) hypertension: Secondary | ICD-10-CM

## 2021-06-03 ENCOUNTER — Other Ambulatory Visit: Payer: Self-pay | Admitting: Family Medicine

## 2021-06-03 DIAGNOSIS — I10 Essential (primary) hypertension: Secondary | ICD-10-CM

## 2021-06-04 ENCOUNTER — Other Ambulatory Visit: Payer: Self-pay | Admitting: Family Medicine

## 2021-06-04 ENCOUNTER — Telehealth: Payer: Self-pay | Admitting: Family Medicine

## 2021-06-04 DIAGNOSIS — E7800 Pure hypercholesterolemia, unspecified: Secondary | ICD-10-CM

## 2021-06-04 DIAGNOSIS — I10 Essential (primary) hypertension: Secondary | ICD-10-CM

## 2021-06-04 DIAGNOSIS — R739 Hyperglycemia, unspecified: Secondary | ICD-10-CM

## 2021-06-04 NOTE — Telephone Encounter (Signed)
Pt is calling to request Dr. B to send lab orders to have her kidney & liver checked prior to her CPE in 7/23.  ? ?[phone] ?

## 2021-06-04 NOTE — Telephone Encounter (Signed)
Medication Refill - Medication: losartan (COZAAR) 100 MG tablet [[id number]]  ? ?Has the patient contacted their pharmacy? Yes.   ? ? ?Preferred Pharmacy (with phone number or street name):  ?[id number] - [place] (N), [place] - [address]  ?[address]  ([address]) [place] [address]  ?Phone: [phone] Fax: [phone]  ?Hours: Not open 24 hours  ? ? ? ?Has the patient been seen for an appointment in the last year OR does the patient have an upcoming appointment? Yes.  08/16/21 ? ?Agent: Please be advised that RX refills may take up to 3 business days. We ask that you follow-up with your pharmacy.  ?

## 2021-06-07 NOTE — Telephone Encounter (Signed)
She'll be due for A1c, lipid, CMP at her physical.  Ok to order, but she should not have these done until about 2 days prior to her scheduled visit (fasting). ?

## 2021-06-07 NOTE — Telephone Encounter (Signed)
Attempted call to advise labs have been placed. Call can not go through.  ?

## 2021-06-07 NOTE — Telephone Encounter (Signed)
rx was sent to pharmacy on 03/30/21 #90/1 RF ?Requested Prescriptions  ?Refused Prescriptions Disp Refills  ?? losartan (COZAAR) 100 MG tablet 90 tablet 1  ?  Sig: Take 1 tablet (100 mg total) by mouth daily.  ?  ? Cardiovascular:  Angiotensin Receptor Blockers Failed - 06/04/2021  3:35 PM  ?  ?  Failed - Cr in normal range and within 180 days  ?  Creat  ?Date Value Ref Range Status  ?11/04/2016 1.07 (H) 0.60 - 0.93 mg/dL Final  ?  Comment:  ?  For patients >61 years of age, the reference limit ?for Creatinine is approximately 13% higher for people ?identified as African-American. ?. ?  ? ?Creatinine, Ser  ?Date Value Ref Range Status  ?08/10/2020 1.25 (H) 0.57 - 1.00 mg/dL Final  ?   ?  ?  Failed - K in normal range and within 180 days  ?  Potassium  ?Date Value Ref Range Status  ?08/10/2020 4.6 3.5 - 5.2 mmol/L Final  ?05/30/2012 4.2 3.5 - 5.1 mmol/L Final  ?   ?  ?  Passed - Patient is not pregnant  ?  ?  Passed - Last BP in normal range  ?  BP Readings from Last 1 Encounters:  ?02/11/21 122/75  ?   ?  ?  Passed - Valid encounter within last 6 months  ?  Recent Outpatient Visits   ?      ? 3 months ago Hypertension, essential  ? [facility], Dionne Bucy, MD  ? 10 months ago Encounter for annual physical exam  ? [facility], Dionne Bucy, MD  ? 1 year ago Hypertension, essential  ? [facility], Dionne Bucy, MD  ? 2 years ago Hypertension, essential  ? [facility], Dionne Bucy, MD  ? 3 years ago Hypertension, essential  ? [facility], Dionne Bucy, MD  ?  ?  ?Future Appointments   ?        ? In 2 months Bacigalupo, Dionne Bucy, MD [facility]  ?  ? ?  ?  ?  ? ? ?

## 2021-06-08 NOTE — Telephone Encounter (Signed)
Attempted call. Call can not go through.  ?

## 2021-07-09 ENCOUNTER — Telehealth: Payer: Self-pay

## 2021-07-09 NOTE — Telephone Encounter (Signed)
Copied from CRM [id number]. Topic: Appointment Scheduling - Scheduling Inquiry for Clinic >> Jul 09, 2021  2:41 PM Esperanza Sheets wrote: Reason for CRM: Patient calling to schedule previsit labs prior to cpe on 7-24, informed patient her insurance will not cover cost of labs done previous to visit  I notice patient cpe was marked to be r/s, provider's next availability is not until Oct 2023, please call patient to follow up on previst labs and rescheduling of cpe

## 2021-07-12 NOTE — Telephone Encounter (Signed)
It says that her insurance will not cover her labs before the visit.  She can be rescheduled with [place] who will be covering for my leave.  Does not have to wait for my next available.    Gina [name], we need to make [facility] aware of that.

## 2021-07-12 NOTE — Telephone Encounter (Signed)
Patient advised as below.  

## 2021-08-02 ENCOUNTER — Emergency Department
Admission: EM | Admit: 2021-08-02 | Discharge: 2021-08-24 | Disposition: E | Payer: Medicare Other | Attending: Emergency Medicine | Admitting: Emergency Medicine

## 2021-08-02 ENCOUNTER — Other Ambulatory Visit: Payer: Self-pay

## 2021-08-02 DIAGNOSIS — R6889 Other general symptoms and signs: Secondary | ICD-10-CM | POA: Diagnosis not present

## 2021-08-02 DIAGNOSIS — I10 Essential (primary) hypertension: Secondary | ICD-10-CM | POA: Diagnosis not present

## 2021-08-02 DIAGNOSIS — I499 Cardiac arrhythmia, unspecified: Secondary | ICD-10-CM | POA: Diagnosis not present

## 2021-08-02 DIAGNOSIS — R092 Respiratory arrest: Secondary | ICD-10-CM | POA: Diagnosis not present

## 2021-08-02 DIAGNOSIS — R3912 Poor urinary stream: Secondary | ICD-10-CM | POA: Insufficient documentation

## 2021-08-02 DIAGNOSIS — Z743 Need for continuous supervision: Secondary | ICD-10-CM | POA: Diagnosis not present

## 2021-08-02 DIAGNOSIS — I469 Cardiac arrest, cause unspecified: Secondary | ICD-10-CM | POA: Diagnosis not present

## 2021-08-02 DIAGNOSIS — R799 Abnormal finding of blood chemistry, unspecified: Secondary | ICD-10-CM | POA: Diagnosis not present

## 2021-08-02 DIAGNOSIS — I4891 Unspecified atrial fibrillation: Secondary | ICD-10-CM | POA: Diagnosis not present

## 2021-08-02 LAB — CBG MONITORING, ED: Glucose-Capillary: 276 mg/dL — ABNORMAL HIGH (ref 70–99)

## 2021-08-02 MED ORDER — ATROPINE SULFATE 1 MG/ML IJ SOLN
INTRAMUSCULAR | Status: AC | PRN
Start: 2021-08-02 — End: 2021-08-02
  Administered 2021-08-02: 1 mg via INTRAVENOUS

## 2021-08-02 MED ORDER — SODIUM BICARBONATE 8.4 % IV SOLN
INTRAVENOUS | Status: AC | PRN
  Administered 2021-08-02: 50 meq via INTRAVENOUS

## 2021-08-02 MED ORDER — AMIODARONE HCL 150 MG/3ML IV SOLN
INTRAVENOUS | Status: AC | PRN
  Administered 2021-08-02: 150 mg via INTRAVENOUS

## 2021-08-02 MED ORDER — CALCIUM CHLORIDE 10 % IV SOLN
INTRAVENOUS | Status: AC | PRN
  Administered 2021-08-02: 1 g via INTRAVENOUS

## 2021-08-02 MED ORDER — EPINEPHRINE 1 MG/10ML IJ SOSY
PREFILLED_SYRINGE | INTRAMUSCULAR | Status: AC | PRN
  Administered 2021-08-02 (×5): 1 mg via INTRAVENOUS

## 2021-08-02 MED ORDER — NOREPINEPHRINE 4 MG/250ML-% IV SOLN
INTRAVENOUS | Status: AC | PRN
  Administered 2021-08-02: 5 ug/min via INTRAVENOUS

## 2021-08-16 ENCOUNTER — Encounter: Payer: Medicare Other | Admitting: Family Medicine

## 2021-08-17 ENCOUNTER — Ambulatory Visit: Payer: Medicare Other | Admitting: Family Medicine

## 2021-08-24 NOTE — ED Provider Notes (Signed)
[facility] Provider Note    Event Date/Time   First MD Initiated Contact with Patient 08-28-21 [phone]     (approximate)   History   Cardiac Arrest   HPI  Gina [name] is a 81 y.o. female with a past medical history of HTN, HDL and previous TIA who presents via EMS for evaluation.  Per EMS patient went into cardiac arrest in route.  ET tube was placed and ACLS protocols were initiated.  They state they were initially concerned about possible sepsis due to reported shortness of breath and decreased urine output last couple days.  No other history is immediately available on presentation.    Past Medical History:  Diagnosis Date   Abnormal blood sugar 08/03/2007   Hyperlipidemia    Hypertension 2010   Mini stroke 2010, 2014     Physical Exam  Triage Vital Signs: ED Triage Vitals  Enc Vitals Group     BP --      Pulse Rate 08/28/21 0856 (!) 122     Resp --      Temp --      Temp src --      SpO2 08-28-2021 0907 91 %     Weight --      Height --      Head Circumference --      Peak Flow --      Pain Score --      Pain Loc --      Pain Edu? --      Excl. in [place]? --     Most recent vital signs: Vitals:   08/28/2021 0856 Aug 28, 2021 0907  Pulse: (!) 122   SpO2:  91%    General: Unconscious.  Toxic appearing. CV:  No palpable pulse. Resp:  Breath sounds heard bilaterally.  ET tube in place. Abd:  No distention.  Other:  Pupils nonreactive.  Patient is unresponsive and does not withdrawal to noxious stimuli.  No obvious trauma to the face scalp neck head or extremities.   ED Results / Procedures / Treatments  Labs (all labs ordered are listed, but only abnormal results are displayed) Labs Reviewed  CBG MONITORING, ED - Abnormal; Notable for the following components:      Result Value   Glucose-Capillary 276 (*)    All other components within normal limits     EKG   RADIOLOGY    PROCEDURES:  Critical Care performed: Yes, see critical  care procedure note(s)  .Critical Care  Performed by: Lucrezia Starch, MD Authorized by: Lucrezia Starch, MD   Critical care provider statement:    Critical care time (minutes):  30   Critical care was necessary to treat or prevent imminent or life-threatening deterioration of the following conditions:  Shock, circulatory failure, respiratory failure, cardiac failure and CNS failure or compromise   Critical care was time spent personally by me on the following activities:  Development of treatment plan with patient or surrogate, discussions with consultants, evaluation of patient's response to treatment, examination of patient, ordering and review of laboratory studies, ordering and review of radiographic studies, ordering and performing treatments and interventions, pulse oximetry, re-evaluation of patient's condition and review of old charts     MEDICATIONS ORDERED IN ED: Medications  EPINEPHrine (ADRENALIN) 1 MG/10ML injection (1 mg Intravenous Given 08-28-2021 0912)  atropine injection (1 mg Intravenous Given Aug 28, 2021 0904)  amiodarone (CORDARONE) injection (150 mg Intravenous Given August 28, 2021 0900)  norepinephrine (LEVOPHED) 71m in  231m (0.016 mg/mL) premix infusion (15 mcg/min Intravenous Rate/Dose Change 72023/07/240910)  sodium bicarbonate injection (50 mEq Intravenous Given [id number])     IMPRESSION / MDM / ASSESSMENT AND PLAN / ED COURSE  I reviewed the triage vital signs and the nursing notes. Patient's presentation is most consistent with acute presentation with potential threat to life or bodily function.                               Patient presented to cardiac arrest with ongoing CPR.  ET tube placed prior to arrival.  Glucose confirmed on arrival.  CPR continued.  Patient was immediately placed on ED pads.  ACS protocols were continued for approximately 30 minutes.  During this time patient was at 1 point noted to be in pulseless V. tach and received a debridement with  fibrillation, RVR with a palpable pulse that was quickly lost after undergoing synchronized cardioversion.  During this time I met with patient's son voiced that he did not wish to continue aggressive resuscitation measures.  Resuscitation attempts were stopped on final pulse check and time of death was called at 9:12 PM.  Please see code documentation for full details regarding timing of CPR and pulse checks as well as ACLS medications given.        FINAL CLINICAL IMPRESSION(S) / ED DIAGNOSES   Final diagnoses:  Cardiac arrest ([facility]     Rx / DC Orders   ED Discharge Orders     None        Note:  This document was prepared using Dragon voice recognition software and may include unintentional dictation errors.   SLucrezia Starch MD 02023/07/[phone]

## 2021-08-24 NOTE — Progress Notes (Signed)
   23-Aug-2021 0930  Clinical Encounter Type  Visited With Patient  Visit Type Initial  Referral From Nurse  Consult/Referral To Chaplain  Advance Directives (For Healthcare)  Does Patient Have a Medical Advance Directive? Unable to assess, patient is non-responsive or altered mental status  Mental Health Advance Directives  Does Patient Have a Mental Health Advance Directive? Unable to assess, patient is non-responsive or altered mental status   Chaplain responded to nurse page for family support for son of patient. Chaplain spoke with care team and admissions team and all reported that son left the hospital quickly after patient's death. Chaplain is available should any family return.

## 2021-08-24 NOTE — Code Documentation (Signed)
Pt arrives with ET tube in place. Pt on LUCAS device in place.

## 2021-08-24 NOTE — Code Documentation (Signed)
Pt obtained ROSC at approximately 0855. Pt went into V tach with a pulse. Pt was cardioverted at 0856. Pt went into pulsesless v tach at 0857 and was defibbed and CPR was restarted

## 2021-08-24 NOTE — Code Documentation (Addendum)
Attempting to pace pt, not able to get capture.

## 2021-08-24 NOTE — ED Notes (Signed)
Spoke with [place] from [facility]. Pt pending for tissue.

## 2021-08-24 NOTE — Progress Notes (Signed)
08/04/21 1305: Spoke with staff member at [facility] family practice that Merita Norton MD would sign the death certificate. Contacted [employer] for them to take Gina [name] into their care. Stated they would be here this afternoon.

## 2021-08-24 NOTE — Code Documentation (Signed)
CPR restarted.

## 2021-08-24 NOTE — ED Notes (Signed)
Pt placed in body bag. Tag on toe, and tag on bag. Chart stickers on bed. Pts shirt and glasses also placed inside bag.

## 2021-08-24 NOTE — Code Documentation (Addendum)
ROSC achieved. Pt in V tach. Synchronized cardioversion performed.

## 2021-08-24 NOTE — Code Documentation (Signed)
Pt is now in pulseless v tach. Pt defibrillated, CPR restarted.

## 2021-08-24 NOTE — ED Triage Notes (Signed)
Pt to ED via [facility], pt came in CPR in progress. Pt has ETT and LUCAS device in place upon arrival to the ED. Pt was being ventilated via BVM.   Per EMS they were initially called out for shortness of breath. Pt was alert and able to give her name. EMS reports initial respiratory rate was in the 30's and CO2 was 19. Pt was in A. Fib with a rate of 157   According to EMS code documentation pt went apneic at 0820 the lost pulses with CPR being initiated at 0824. Pt was given a total of 1 liter of fluid PTA and 3 rounds of Epi by EMS.   Pt has 6.5 tube, at 20 at the teeth.

## 2021-08-24 DEATH — deceased
